# Patient Record
Sex: Male | Born: 1940 | Race: White | Hispanic: No | Marital: Married | Smoking: Former smoker
Health system: Southern US, Community
[De-identification: ages and names within clinical notes are randomized; demographics above are authoritative.]

## PROBLEM LIST (undated history)

## (undated) DIAGNOSIS — I272 Pulmonary hypertension, unspecified: Secondary | ICD-10-CM

## (undated) DIAGNOSIS — K219 Gastro-esophageal reflux disease without esophagitis: Secondary | ICD-10-CM

## (undated) DIAGNOSIS — Z973 Presence of spectacles and contact lenses: Secondary | ICD-10-CM

## (undated) DIAGNOSIS — N183 Chronic kidney disease, stage 3 unspecified: Secondary | ICD-10-CM

## (undated) DIAGNOSIS — E7800 Pure hypercholesterolemia, unspecified: Secondary | ICD-10-CM

## (undated) DIAGNOSIS — J302 Other seasonal allergic rhinitis: Secondary | ICD-10-CM

## (undated) DIAGNOSIS — M4802 Spinal stenosis, cervical region: Secondary | ICD-10-CM

## (undated) DIAGNOSIS — Q211 Atrial septal defect: Secondary | ICD-10-CM

## (undated) DIAGNOSIS — I2510 Atherosclerotic heart disease of native coronary artery without angina pectoris: Secondary | ICD-10-CM

## (undated) DIAGNOSIS — Z8679 Personal history of other diseases of the circulatory system: Secondary | ICD-10-CM

## (undated) DIAGNOSIS — I739 Peripheral vascular disease, unspecified: Secondary | ICD-10-CM

## (undated) DIAGNOSIS — I639 Cerebral infarction, unspecified: Secondary | ICD-10-CM

## (undated) DIAGNOSIS — I10 Essential (primary) hypertension: Secondary | ICD-10-CM

## (undated) DIAGNOSIS — I340 Nonrheumatic mitral (valve) insufficiency: Secondary | ICD-10-CM

## (undated) DIAGNOSIS — H409 Unspecified glaucoma: Secondary | ICD-10-CM

## (undated) DIAGNOSIS — I480 Paroxysmal atrial fibrillation: Secondary | ICD-10-CM

## (undated) DIAGNOSIS — Q2112 Patent foramen ovale: Secondary | ICD-10-CM

## (undated) DIAGNOSIS — E1121 Type 2 diabetes mellitus with diabetic nephropathy: Secondary | ICD-10-CM

## (undated) DIAGNOSIS — F419 Anxiety disorder, unspecified: Secondary | ICD-10-CM

## (undated) DIAGNOSIS — I5032 Chronic diastolic (congestive) heart failure: Secondary | ICD-10-CM

## (undated) DIAGNOSIS — Z9889 Other specified postprocedural states: Secondary | ICD-10-CM

## (undated) DIAGNOSIS — I779 Disorder of arteries and arterioles, unspecified: Secondary | ICD-10-CM

## (undated) DIAGNOSIS — G459 Transient cerebral ischemic attack, unspecified: Secondary | ICD-10-CM

## (undated) HISTORY — PX: CARDIAC CATHETERIZATION: SHX172

## (undated) HISTORY — DX: Transient cerebral ischemic attack, unspecified: G45.9

## (undated) HISTORY — DX: Gastro-esophageal reflux disease without esophagitis: K21.9

## (undated) HISTORY — DX: Spinal stenosis, cervical region: M48.02

## (undated) HISTORY — DX: Nonrheumatic mitral (valve) insufficiency: I34.0

## (undated) HISTORY — PX: KNEE SURGERY: SHX244

## (undated) HISTORY — PX: MOUTH SURGERY: SHX715

## (undated) HISTORY — DX: Unspecified glaucoma: H40.9

---
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
## 2011-12-02 ENCOUNTER — Emergency Department (HOSPITAL_BASED_OUTPATIENT_CLINIC_OR_DEPARTMENT_OTHER): Payer: Medicare Other

## 2011-12-02 ENCOUNTER — Emergency Department (HOSPITAL_BASED_OUTPATIENT_CLINIC_OR_DEPARTMENT_OTHER)
Admission: EM | Admit: 2011-12-02 | Discharge: 2011-12-02 | Disposition: A | Discharge status: Home / Self Care | Payer: Medicare Other | Attending: Emergency Medicine | Admitting: Emergency Medicine

## 2011-12-02 ENCOUNTER — Encounter (HOSPITAL_BASED_OUTPATIENT_CLINIC_OR_DEPARTMENT_OTHER): Payer: Self-pay | Admitting: *Deleted

## 2011-12-02 DIAGNOSIS — R05 Cough: Secondary | ICD-10-CM | POA: Insufficient documentation

## 2011-12-02 DIAGNOSIS — Z79899 Other long term (current) drug therapy: Secondary | ICD-10-CM | POA: Insufficient documentation

## 2011-12-02 DIAGNOSIS — E7800 Pure hypercholesterolemia, unspecified: Secondary | ICD-10-CM | POA: Insufficient documentation

## 2011-12-02 DIAGNOSIS — E119 Type 2 diabetes mellitus without complications: Secondary | ICD-10-CM | POA: Insufficient documentation

## 2011-12-02 DIAGNOSIS — R059 Cough, unspecified: Secondary | ICD-10-CM | POA: Insufficient documentation

## 2011-12-02 DIAGNOSIS — I10 Essential (primary) hypertension: Secondary | ICD-10-CM | POA: Insufficient documentation

## 2011-12-02 DIAGNOSIS — J3489 Other specified disorders of nose and nasal sinuses: Secondary | ICD-10-CM | POA: Insufficient documentation

## 2011-12-02 HISTORY — DX: Essential (primary) hypertension: I10

## 2011-12-02 HISTORY — DX: Pure hypercholesterolemia, unspecified: E78.00

## 2011-12-02 LAB — CBC WITH DIFFERENTIAL/PLATELET
Basophils Absolute: 0 10*3/uL (ref 0.0–0.1)
Basophils Relative: 1 % (ref 0–1)
Eosinophils Absolute: 0.1 10*3/uL (ref 0.0–0.7)
MCH: 33.4 pg (ref 26.0–34.0)
MCHC: 34.8 g/dL (ref 30.0–36.0)
Monocytes Absolute: 0.7 10*3/uL (ref 0.1–1.0)
Neutro Abs: 4.8 10*3/uL (ref 1.7–7.7)
Neutrophils Relative %: 74 % (ref 43–77)
RDW: 11.4 % — ABNORMAL LOW (ref 11.5–15.5)

## 2011-12-02 LAB — BASIC METABOLIC PANEL
BUN: 16 mg/dL (ref 6–23)
Calcium: 9.6 mg/dL (ref 8.4–10.5)
GFR calc Af Amer: 76 mL/min — ABNORMAL LOW (ref >90)
GFR calc non Af Amer: 66 mL/min — ABNORMAL LOW (ref >90)
Glucose, Bld: 140 mg/dL — ABNORMAL HIGH (ref 70–99)
Potassium: 4.1 mEq/L (ref 3.5–5.1)
Sodium: 136 mEq/L (ref 135–145)

## 2011-12-02 MED ORDER — ALPRAZOLAM 0.5 MG PO TABS
1.0000 mg | ORAL_TABLET | Freq: Once | ORAL | Status: AC
  Administered 2011-12-02: 1 mg via ORAL
  Filled 2011-12-02: qty 2

## 2011-12-02 NOTE — ED Provider Notes (Addendum)
History     CSN: [id number]  Arrival date & time 12/02/11  1444   First MD Initiated Contact with Patient 12/02/11 1640      Chief Complaint  Patient presents with  . Panic Attack    (Consider location/radiation/quality/duration/timing/severity/associated sxs/prior treatment) HPI  Patient states seen at PMD is Dr. At [facility].  Patien tstates he had cold symptoms for a week.  He states whenever he goes into a medical situation he has been taking otc cold medicine.  He denies chest pain or dyspnea.  Patient states bp was 220 and sent here.  He states this has happened multiple times.  He feels improved.  His wife had similar symptoms before he has.  His blood sugar has never been over 120.    Past Medical History  Diagnosis Date  . Diabetes mellitus without complication   . Hypertension   . High cholesterol     History reviewed. No pertinent past surgical history.  No family history on file.  History  Substance Use Topics  . Smoking status: Never Smoker   . Smokeless tobacco: Not on file  . Alcohol Use: Yes      Review of Systems  Constitutional: Negative for chills and activity change.  HENT: Positive for congestion, rhinorrhea and sneezing.   Respiratory: Positive for cough. Negative for chest tightness, shortness of breath and wheezing.   Cardiovascular: Negative for chest pain and leg swelling.  Gastrointestinal: Negative for abdominal distention.  Genitourinary: Negative for difficulty urinating.  Neurological: Negative for dizziness, weakness, light-headedness, numbness and headaches.    Allergies  Penicillins  Home Medications   Current Outpatient Rx  Name Route Sig Dispense Refill  . CLINDINIUM-CHLORDIAZEPOXIDE 2.5-5 MG PO CAPS Oral Take 1 capsule by mouth 3 (three) times daily as needed.    . AVAPRO PO Oral Take by mouth.    . METFORMIN HCL PO Oral Take by mouth.    . METOPROLOL SUCCINATE ER 200 MG PO TB24 Oral Take 200 mg by mouth  daily.    Marland Kitchen PRAVACHOL PO Oral Take by mouth.      BP 163/73  Pulse 68  Temp 98.7 F (37.1 C) (Oral)  Resp 16  SpO2 100%  Physical Exam  Nursing note and vitals reviewed. Constitutional: He is oriented to person, place, and time. He appears well-developed and well-nourished.  HENT:  Head: Normocephalic and atraumatic.  Eyes: Conjunctivae normal and EOM are normal. Pupils are equal, round, and reactive to light.  Neck: Normal range of motion. Neck supple.  Cardiovascular: Normal rate and normal heart sounds.   Pulmonary/Chest: Effort normal and breath sounds normal.  Abdominal: Soft. Bowel sounds are normal.  Musculoskeletal: Normal range of motion.  Neurological: He is alert and oriented to person, place, and time.  Skin: Skin is warm and dry.  Psychiatric: He has a normal mood and affect.    ED Course  Procedures (including critical care time)  Labs Reviewed - No data to display No results found.   No diagnosis found.  Date: 12/02/2011  Rate: 72  Rhythm: normal sinus rhythm  QRS Axis: normal  Intervals: first degree av block  ST/T Wave abnormalities: normal  Conduction Disutrbances:first-degree A-V block   Narrative Interpretation:   Old EKG Reviewed: none available   Results for orders placed during the hospital encounter of 12/02/11  CBC WITH DIFFERENTIAL      Component Value Range   WBC 6.4  4.0 - 10.5 K/uL   RBC  3.50 (*) 4.22 - 5.81 MIL/uL   Hemoglobin 11.7 (*) 13.0 - 17.0 g/dL   HCT 40.9 (*) 81.1 - 91.4 %   MCV 96.0  78.0 - 100.0 fL   MCH 33.4  26.0 - 34.0 pg   MCHC 34.8  30.0 - 36.0 g/dL   RDW 78.2 (*) 95.6 - 21.3 %   Platelets 177  150 - 400 K/uL   Neutrophils Relative 74  43 - 77 %   Neutro Abs 4.8  1.7 - 7.7 K/uL   Lymphocytes Relative 13  12 - 46 %   Lymphs Abs 0.9  0.7 - 4.0 K/uL   Monocytes Relative 10  3 - 12 %   Monocytes Absolute 0.7  0.1 - 1.0 K/uL   Eosinophils Relative 2  0 - 5 %   Eosinophils Absolute 0.1  0.0 - 0.7 K/uL   Basophils  Relative 1  0 - 1 %   Basophils Absolute 0.0  0.0 - 0.1 K/uL  TROPONIN I      Component Value Range   Troponin I <0.30  <0.30 ng/mL  BASIC METABOLIC PANEL      Component Value Range   Sodium 136  135 - 145 mEq/L   Potassium 4.1  3.5 - 5.1 mEq/L   Chloride 94 (*) 96 - 112 mEq/L   CO2 29  19 - 32 mEq/L   Glucose, Bld 140 (*) 70 - 99 mg/dL   BUN 16  6 - 23 mg/dL   Creatinine, Ser 0.86  0.50 - 1.35 mg/dL   Calcium 9.6  8.4 - 57.8 mg/dL   GFR calc non Af Amer 66 (*) >90 mL/min   GFR calc Af Amer 76 (*) >90 mL/min     MDM  Patient with asymptomatic hypertension today presented from [facility] via [facility]. Patient reports this has happened multiple times when he is evaluated by medical personnel. He self describes this as white coat syndrome. His blood pressure has decreased on arrival from reported systolic blood pressure of 220-183/79 here. He has no headache and no focal neurological deficits. He is already on anti-hypertensives. He is followed by an internist. He is advised to followup there.  The results of his labs are above and results were discussed with the patient.        Hilario Quarry, MD 12/02/11 4696  Hilario Quarry, MD 01/04/12 [phone]

## 2011-12-02 NOTE — ED Notes (Signed)
Went to the [facility] for cold symptoms and had a panic attack while there. States this is common whenever he is around [employer]. EMS was called and transported pt here due to elevated BP. He is alert oriented on arrival. Worried about how he will get back to his car in [place].

## 2013-02-03 DIAGNOSIS — M4802 Spinal stenosis, cervical region: Secondary | ICD-10-CM

## 2013-02-03 HISTORY — DX: Spinal stenosis, cervical region: M48.02

## 2014-02-25 ENCOUNTER — Emergency Department (HOSPITAL_COMMUNITY): Payer: Medicare Other

## 2014-02-25 ENCOUNTER — Encounter (HOSPITAL_COMMUNITY): Payer: Self-pay | Admitting: Radiology

## 2014-02-25 ENCOUNTER — Emergency Department (HOSPITAL_COMMUNITY)
Admission: EM | Admit: 2014-02-25 | Discharge: 2014-02-25 | Disposition: A | Discharge status: Home / Self Care | Payer: Medicare Other | Attending: Emergency Medicine | Admitting: Emergency Medicine

## 2014-02-25 DIAGNOSIS — Z79899 Other long term (current) drug therapy: Secondary | ICD-10-CM | POA: Diagnosis not present

## 2014-02-25 DIAGNOSIS — E780 Pure hypercholesterolemia: Secondary | ICD-10-CM | POA: Diagnosis not present

## 2014-02-25 DIAGNOSIS — I10 Essential (primary) hypertension: Secondary | ICD-10-CM | POA: Diagnosis not present

## 2014-02-25 DIAGNOSIS — R4781 Slurred speech: Secondary | ICD-10-CM | POA: Diagnosis present

## 2014-02-25 DIAGNOSIS — Z880 Allergy status to penicillin: Secondary | ICD-10-CM | POA: Insufficient documentation

## 2014-02-25 DIAGNOSIS — E119 Type 2 diabetes mellitus without complications: Secondary | ICD-10-CM | POA: Diagnosis not present

## 2014-02-25 DIAGNOSIS — Z7982 Long term (current) use of aspirin: Secondary | ICD-10-CM | POA: Insufficient documentation

## 2014-02-25 DIAGNOSIS — G459 Transient cerebral ischemic attack, unspecified: Secondary | ICD-10-CM

## 2014-02-25 LAB — I-STAT TROPONIN, ED: TROPONIN I, POC: 0 ng/mL (ref 0.00–0.08)

## 2014-02-25 LAB — COMPREHENSIVE METABOLIC PANEL
ALK PHOS: 74 U/L (ref 39–117)
ALT: 12 U/L (ref 0–53)
ANION GAP: 10 (ref 5–15)
AST: 20 U/L (ref 0–37)
Albumin: 3.9 g/dL (ref 3.5–5.2)
BILIRUBIN TOTAL: 0.8 mg/dL (ref 0.3–1.2)
BUN: 15 mg/dL (ref 6–23)
CALCIUM: 9.2 mg/dL (ref 8.4–10.5)
CO2: 26 mmol/L (ref 19–32)
CREATININE: 1.33 mg/dL (ref 0.50–1.35)
Chloride: 98 mmol/L (ref 96–112)
GFR calc Af Amer: 60 mL/min — ABNORMAL LOW (ref >90)
GFR, EST NON AFRICAN AMERICAN: 51 mL/min — AB (ref >90)
GLUCOSE: 135 mg/dL — AB (ref 70–99)
Potassium: 4.8 mmol/L (ref 3.5–5.1)
Sodium: 134 mmol/L — ABNORMAL LOW (ref 135–145)
Total Protein: 6.5 g/dL (ref 6.0–8.3)

## 2014-02-25 LAB — DIFFERENTIAL
BASOS ABS: 0 10*3/uL (ref 0.0–0.1)
Basophils Relative: 1 % (ref 0–1)
Eosinophils Absolute: 0.1 10*3/uL (ref 0.0–0.7)
Eosinophils Relative: 2 % (ref 0–5)
LYMPHS ABS: 0.9 10*3/uL (ref 0.7–4.0)
LYMPHS PCT: 15 % (ref 12–46)
MONO ABS: 0.8 10*3/uL (ref 0.1–1.0)
MONOS PCT: 12 % (ref 3–12)
NEUTROS ABS: 4.6 10*3/uL (ref 1.7–7.7)
NEUTROS PCT: 70 % (ref 43–77)

## 2014-02-25 LAB — I-STAT CHEM 8, ED
BUN: 18 mg/dL (ref 6–23)
CHLORIDE: 100 mmol/L (ref 96–112)
CREATININE: 1.3 mg/dL (ref 0.50–1.35)
Calcium, Ion: 1.16 mmol/L (ref 1.13–1.30)
GLUCOSE: 135 mg/dL — AB (ref 70–99)
HCT: 40 % (ref 39.0–52.0)
Hemoglobin: 13.6 g/dL (ref 13.0–17.0)
Potassium: 4.9 mmol/L (ref 3.5–5.1)
Sodium: 136 mmol/L (ref 135–145)
TCO2: 24 mmol/L (ref 0–100)

## 2014-02-25 LAB — CBC
HEMATOCRIT: 36.3 % — AB (ref 39.0–52.0)
Hemoglobin: 12.6 g/dL — ABNORMAL LOW (ref 13.0–17.0)
MCH: 33.6 pg (ref 26.0–34.0)
MCHC: 34.7 g/dL (ref 30.0–36.0)
MCV: 96.8 fL (ref 78.0–100.0)
PLATELETS: 179 10*3/uL (ref 150–400)
RBC: 3.75 MIL/uL — ABNORMAL LOW (ref 4.22–5.81)
RDW: 12.3 % (ref 11.5–15.5)
WBC: 6.4 10*3/uL (ref 4.0–10.5)

## 2014-02-25 LAB — PROTIME-INR
INR: 1.06 (ref 0.00–1.49)
Prothrombin Time: 13.9 seconds (ref 11.6–15.2)

## 2014-02-25 LAB — APTT: APTT: 26 s (ref 24–37)

## 2014-02-25 NOTE — Discharge Instructions (Signed)
Transient Ischemic Attack Increase your aspirin to 325 mg a day as discussed with Dr. Doy Mince.  A transient ischemic attack (TIA) is a "warning stroke" that causes stroke-like symptoms. Unlike a stroke, a TIA does not cause permanent damage to the brain. The symptoms of a TIA can happen very fast and do not last long. It is important to know the symptoms of a TIA and what to do. This can help prevent a major stroke or death. CAUSES   A TIA is caused by a temporary blockage in an artery in the brain or neck (carotid artery). The blockage does not allow the brain to get the blood supply it needs and can cause different symptoms. The blockage can be caused by either:  A blood clot.  Fatty buildup (plaque) in a neck or brain artery. RISK FACTORS  High blood pressure (hypertension).  High cholesterol.  Diabetes mellitus.  Heart disease.  The build up of plaque in the blood vessels (peripheral artery disease or atherosclerosis).  The build up of plaque in the blood vessels providing blood and oxygen to the brain (carotid artery stenosis).  An abnormal heart rhythm (atrial fibrillation).  Obesity.  Smoking.  Taking oral contraceptives (especially in combination with smoking).  Physical inactivity.  A diet high in fats, salt (sodium), and calories.  Alcohol use.  Use of illegal drugs (especially cocaine and methamphetamine).  Being male.  Being African American.  Being over the age of 57.  Family history of stroke.  Previous history of blood clots, stroke, TIA, or heart attack.  Sickle cell disease. SYMPTOMS  TIA symptoms are the same as a stroke but are temporary. These symptoms usually develop suddenly, or may be newly present upon awakening from sleep:  Sudden weakness or numbness of the face, arm, or leg, especially on one side of the body.  Sudden trouble walking or difficulty moving arms or legs.  Sudden confusion.  Sudden personality changes.  Trouble  speaking (aphasia) or understanding.  Difficulty swallowing.  Sudden trouble seeing in one or both eyes.  Double vision.  Dizziness.  Loss of balance or coordination.  Sudden severe headache with no known cause.  Trouble reading or writing.  Loss of bowel or bladder control.  Loss of consciousness. DIAGNOSIS  Your caregiver may be able to determine the presence or absence of a TIA based on your symptoms, history, and physical exam. Computed tomography (CT scan) of the brain is usually performed to help identify a TIA. Other tests may be done to diagnose a TIA. These tests may include:  Electrocardiography.  Continuous heart monitoring.  Echocardiography.  Carotid ultrasonography.  Magnetic resonance imaging (MRI).  A scan of the brain circulation.  Blood tests. PREVENTION  The risk of a TIA can be decreased by appropriately treating high blood pressure, high cholesterol, diabetes, heart disease, and obesity and by quitting smoking, limiting alcohol, and staying physically active. TREATMENT  Time is of the essence. Since the symptoms of TIA are the same as a stroke, it is important to seek treatment as soon as possible because you may need a medicine to dissolve the clot (thrombolytic) that cannot be given if too much time has passed. Treatment options vary. Treatment options may include rest, oxygen, intravenous (IV) fluids, and medicines to thin the blood (anticoagulants). Medicines and diet may be used to address diabetes, high blood pressure, and other risk factors. Measures will be taken to prevent short-term and long-term complications, including infection from breathing foreign material into the  lungs (aspiration pneumonia), blood clots in the legs, and falls. Treatment options include procedures to either remove plaque in the carotid arteries or dilate carotid arteries that have narrowed due to plaque. Those procedures are:  Carotid endarterectomy.  Carotid  angioplasty and stenting. HOME CARE INSTRUCTIONS   Take all medicines prescribed by your caregiver. Follow the directions carefully. Medicines may be used to control risk factors for a stroke. Be sure you understand all your medicine instructions.  You may be told to take aspirin or the anticoagulant warfarin. Warfarin needs to be taken exactly as instructed.  Taking too much or too little warfarin is dangerous. Too much warfarin increases the risk of bleeding. Too little warfarin continues to allow the risk for blood clots. While taking warfarin, you will need to have regular blood tests to measure your blood clotting time. A PT blood test measures how long it takes for blood to clot. Your PT is used to calculate another value called an INR. Your PT and INR help your caregiver to adjust your dose of warfarin. The dose can change for many reasons. It is critically important that you take warfarin exactly as prescribed.  Many foods, especially foods high in vitamin K can interfere with warfarin and affect the PT and INR. Foods high in vitamin K include spinach, kale, broccoli, cabbage, collard and turnip greens, brussels sprouts, peas, cauliflower, seaweed, and parsley as well as beef and pork liver, green tea, and soybean oil. You should eat a consistent amount of foods high in vitamin K. Avoid major changes in your diet, or notify your caregiver before changing your diet. Arrange a visit with a dietitian to answer your questions.  Many medicines can interfere with warfarin and affect the PT and INR. You must tell your caregiver about any and all medicines you take, this includes all vitamins and supplements. Be especially cautious with aspirin and anti-inflammatory medicines. Do not take or discontinue any prescribed or over-the-counter medicine except on the advice of your caregiver or pharmacist.  Warfarin can have side effects, such as excessive bruising or bleeding. You will need to hold pressure  over cuts for longer than usual. Your caregiver or pharmacist will discuss other potential side effects.  Avoid sports or activities that may cause injury or bleeding.  Be mindful when shaving, flossing your teeth, or handling sharp objects.  Alcohol can change the body's ability to handle warfarin. It is best to avoid alcoholic drinks or consume only very small amounts while taking warfarin. Notify your caregiver if you change your alcohol intake.  Notify your dentist or other caregivers before procedures.  Eat a diet that includes 5 or more servings of fruits and vegetables each day. This may reduce the risk of stroke. Certain diets may be prescribed to address high blood pressure, high cholesterol, diabetes, or obesity.  A low-sodium, low-saturated fat, low-trans fat, low-cholesterol diet is recommended to manage high blood pressure.  A low-saturated fat, low-trans fat, low-cholesterol, and high-fiber diet may control cholesterol levels.  A controlled-carbohydrate, controlled-sugar diet is recommended to manage diabetes.  A reduced-calorie, low-sodium, low-saturated fat, low-trans fat, low-cholesterol diet is recommended to manage obesity.  Maintain a healthy weight.  Stay physically active. It is recommended that you get at least 30 minutes of activity on most or all days.  Do not smoke.  Limit alcohol use even if you are not taking warfarin. Moderate alcohol use is considered to be:  No more than 2 drinks each day for men.  No more than 1 drink each day for nonpregnant women.  Stop drug abuse.  Home safety. A safe home environment is important to reduce the risk of falls. Your caregiver may arrange for specialists to evaluate your home. Having grab bars in the bedroom and bathroom is often important. Your caregiver may arrange for equipment to be used at home, such as raised toilets and a seat for the shower.  Follow all instructions for follow-up with your caregiver. This is  very important. This includes any referrals and lab tests. Proper follow up can prevent a stroke or another TIA from occurring. SEEK MEDICAL CARE IF:  You have personality changes.  You have difficulty swallowing.  You are seeing double.  You have dizziness.  You have a fever.  You have skin breakdown. SEEK IMMEDIATE MEDICAL CARE IF:  Any of these symptoms may represent a serious problem that is an emergency. Do not wait to see if the symptoms will go away. Get medical help right away. Call your local emergency services (911 in [place] Do not drive yourself to the hospital.  You have sudden weakness or numbness of the face, arm, or leg, especially on one side of the body.  You have sudden trouble walking or difficulty moving arms or legs.  You have sudden confusion.  You have trouble speaking (aphasia) or understanding.  You have sudden trouble seeing in one or both eyes.  You have a loss of balance or coordination.  You have a sudden, severe headache with no known cause.  You have new chest pain or an irregular heartbeat.  You have a partial or total loss of consciousness. MAKE SURE YOU:   Understand these instructions.  Will watch your condition.  Will get help right away if you are not doing well or get worse. Document Released: 10/30/2004 Document Revised: 01/25/2013 Document Reviewed: 04/27/2013 [facility] Patient Information 2015 [place], [place]. This information is not intended to replace advice given to you by your health care provider. Make sure you discuss any questions you have with your health care provider.

## 2014-02-25 NOTE — ED Notes (Addendum)
Pt reports to the ED for eval of sudden onset slurred speech and aphasia. Reports the symptoms lasted approx 5 minutes. He has recent hx of TIAs and was admitted to [facility] around Christmas and had a negative work up. He reports these symptoms lasted approx 5 minutes which is longer than they normally last. He also reports he has been having bilateral leg weakness and pain with minimal exercise. Symptoms resolved at the time of arrival. NIH- 0. Pt also had some recent falls with head injury. PERRL intact. Denies any HA or CP. Pt A&Ox4, resp e/u, and skin warm and dry.

## 2014-02-25 NOTE — Code Documentation (Signed)
Code stroke called at 1152, patient arrived to [facility] ED via [facility] at 1228.  AS per patient, LSN 0930 developed bilateral leg weakness with slurred speech and what sounds like some aphasia.  Patient states has been having TIA's and has a workup at an outside facility and this presentation this am is similar to past TIA's.  On arrival to ED patients symptoms resolved and patients states he is back to baseline.  NIHSS 0

## 2014-02-25 NOTE — ED Provider Notes (Signed)
CSN: [id number]     Arrival date & time 02/25/14  1228 History   First MD Initiated Contact with Patient 02/25/14 1236     Chief Complaint  Patient presents with  . Code Stroke    @EDPCLEARED @ (Consider location/radiation/quality/duration/timing/severity/associated sxs/prior Treatment) HPI The patient had an onset of slurred speech and difficulty talking which lasted for approximately 5 minutes. This came on suddenly between 9:30 and 10:00. The patient's wife had been outside shoveling snow so she wasn't there at the exact onset of symptoms. She did come in and reports that his speech was very difficult to understand and his words didn't make sense. She gave him 3 aspirins and they called the EMS. The patient has had several episodes of TIAs diagnosed. These haven't coming and going for about 6-9 months. Prior evaluation was done at [facility] around Christmas time. The patient reports that all the results were negative and ultimately he was diagnosed as a TIA. There is no pain associated with these episodes. At this point time all symptoms have resolved. The patient has stopped doing much exercise or activity because he feels that the symptoms are precipitated by activity. He describes weakness in both of his legs with limited amount of exertion. Past Medical History  Diagnosis Date  . Diabetes mellitus without complication   . Hypertension   . High cholesterol    History reviewed. No pertinent past surgical history. History reviewed. No pertinent family history. History  Substance Use Topics  . Smoking status: Never Smoker   . Smokeless tobacco: Not on file  . Alcohol Use: Yes     Comment: a few beers a day    Review of Systems 10 Systems reviewed and are negative for acute change except as noted in the HPI.    Allergies  Penicillins; Shellfish allergy; and Fenofibrate  Home Medications   Prior to Admission medications   Medication Sig Start Date End Date Taking? Authorizing  Provider  ALPRAZolam (XANAX) 0.25 MG tablet Take 0.25 mg by mouth 2 (two) times daily as needed for anxiety.   Yes Historical Provider, MD  aspirin 81 MG tablet Take 81 mg by mouth daily.   Yes Historical Provider, MD  Coenzyme Q10 (CO Q 10) 100 MG CAPS Take 1 capsule by mouth daily.   Yes Historical Provider, MD  dicyclomine (BENTYL) 20 MG tablet Take 20 mg by mouth daily.   Yes Historical Provider, MD  irbesartan (AVAPRO) 300 MG tablet Take 300 mg by mouth daily. 06/11/11  Yes Historical Provider, MD  loratadine (CLARITIN) 10 MG tablet Take 10 mg by mouth daily.   Yes Historical Provider, MD  metFORMIN (GLUCOPHAGE) 500 MG tablet Take 500 mg by mouth 2 (two) times daily with a meal.   Yes Historical Provider, MD  metoprolol (TOPROL-XL) 200 MG 24 hr tablet Take 200 mg by mouth daily.   Yes Historical Provider, MD  omeprazole (PRILOSEC) 20 MG capsule Take 20 mg by mouth daily.   Yes Historical Provider, MD  pravastatin (PRAVACHOL) 80 MG tablet Take 80 mg by mouth daily.   Yes Historical Provider, MD  simethicone (MYLICON) 80 MG chewable tablet Chew 80 mg by mouth 2 (two) times daily.   Yes Historical Provider, MD   BP 183/75 mmHg  Pulse 78  Temp(Src) 99 F (37.2 C) (Oral)  Resp 22  Ht 5\' 6"  (1.676 m)  Wt 179 lb (81.194 kg)  BMI 28.91 kg/m2  SpO2 94% Physical Exam  Constitutional: He is oriented to person,  place, and time. He appears well-developed and well-nourished.  HENT:  Head: Normocephalic and atraumatic.  Eyes: EOM are normal. Pupils are equal, round, and reactive to light.  Neck: Neck supple.  Cardiovascular: Normal rate, regular rhythm, normal heart sounds and intact distal pulses.   Pulmonary/Chest: Effort normal and breath sounds normal.  Abdominal: Soft. Bowel sounds are normal. He exhibits no distension. There is no tenderness.  Musculoskeletal: Normal range of motion. He exhibits no edema.  Neurological: He is alert and oriented to person, place, and time. He has normal  strength. Coordination normal. GCS eye subscore is 4. GCS verbal subscore is 5. GCS motor subscore is 6.  Bilateral biceps and bilateral patellar reflexes 2+ and symmetric. Upper and lower extremity strength is 5/5 and symmetric. Mental status is clear and speech is clear with normal content. Cranial nerves II through XII are intact.  Skin: Skin is warm, dry and intact.  Psychiatric: He has a normal mood and affect.    ED Course  Procedures (including critical care time) Labs Review Labs Reviewed  CBC - Abnormal; Notable for the following:    RBC 3.75 (*)    Hemoglobin 12.6 (*)    HCT 36.3 (*)    All other components within normal limits  COMPREHENSIVE METABOLIC PANEL - Abnormal; Notable for the following:    Sodium 134 (*)    Glucose, Bld 135 (*)    GFR calc non Af Amer 51 (*)    GFR calc Af Amer 60 (*)    All other components within normal limits  I-STAT CHEM 8, ED - Abnormal; Notable for the following:    Glucose, Bld 135 (*)    All other components within normal limits  PROTIME-INR  APTT  DIFFERENTIAL  I-STAT TROPOININ, ED  I-STAT TROPOININ, ED    Imaging Review Ct Head Wo Contrast  02/25/2014   CLINICAL DATA:  Slurred speech and aphasia. History of multiple TIAs.  EXAM: CT HEAD WITHOUT CONTRAST  TECHNIQUE: Contiguous axial images were obtained from the base of the skull through the vertex without intravenous contrast.  COMPARISON:  None.  FINDINGS: The brain demonstrates cortical atrophy is well as mild small vessel ischemic changes in the periventricular white matter. The brain demonstrates no evidence of hemorrhage, infarction, edema, mass effect, extra-axial fluid collection, hydrocephalus or mass lesion. The skull is unremarkable.  IMPRESSION: No acute findings.  Cortical atrophy and mild small vessel disease.   Electronically Signed   By: Aletta Edouard M.D.   On: 02/25/2014 12:56   Mr Jodene Nam Head Wo Contrast  02/25/2014   CLINICAL DATA:  Hypertension, diabetes,  hyperlipidemia. TIA. Bilateral lower extremity weakness, right facial droop, inability to speak, word-finding difficulty, slurred speech  EXAM: MRI HEAD WITHOUT CONTRAST  MRA HEAD WITHOUT CONTRAST  TECHNIQUE: Multiplanar, multiecho pulse sequences of the brain and surrounding structures were obtained without intravenous contrast. Angiographic images of the head were obtained using MRA technique without contrast.  COMPARISON:  CT 02/25/2014  FINDINGS: MRI HEAD FINDINGS  Negative for acute infarct. Mild chronic microvascular ischemic change in the white matter  Generalized atrophy.  Negative for hydrocephalus.  Negative for hemorrhage or mass. Pituitary normal in size. No shift of the midline structures.  Paranasal sinuses are clear.  MRA HEAD FINDINGS  Left vertebral artery dominant and widely patent to the basilar. Right vertebral artery ends in PICA. Left PICA not visualized. Basilar widely patent. Superior cerebellar and posterior cerebral arteries widely patent. Fetal origin left posterior cerebral artery  with hypoplastic left P1 segment.  Cavernous carotid widely patent bilaterally without stenosis. Anterior and middle cerebral arteries widely patent.  Negative for cerebral aneurysm.  IMPRESSION: Negative for acute infarct. Mild chronic microvascular ischemic change  Negative MRA head.   Electronically Signed   By: Franchot Gallo M.D.   On: 02/25/2014 15:13   Mr Brain Wo Contrast  02/25/2014   CLINICAL DATA:  Hypertension, diabetes, hyperlipidemia. TIA. Bilateral lower extremity weakness, right facial droop, inability to speak, word-finding difficulty, slurred speech  EXAM: MRI HEAD WITHOUT CONTRAST  MRA HEAD WITHOUT CONTRAST  TECHNIQUE: Multiplanar, multiecho pulse sequences of the brain and surrounding structures were obtained without intravenous contrast. Angiographic images of the head were obtained using MRA technique without contrast.  COMPARISON:  CT 02/25/2014  FINDINGS: MRI HEAD FINDINGS  Negative  for acute infarct. Mild chronic microvascular ischemic change in the white matter  Generalized atrophy.  Negative for hydrocephalus.  Negative for hemorrhage or mass. Pituitary normal in size. No shift of the midline structures.  Paranasal sinuses are clear.  MRA HEAD FINDINGS  Left vertebral artery dominant and widely patent to the basilar. Right vertebral artery ends in PICA. Left PICA not visualized. Basilar widely patent. Superior cerebellar and posterior cerebral arteries widely patent. Fetal origin left posterior cerebral artery with hypoplastic left P1 segment.  Cavernous carotid widely patent bilaterally without stenosis. Anterior and middle cerebral arteries widely patent.  Negative for cerebral aneurysm.  IMPRESSION: Negative for acute infarct. Mild chronic microvascular ischemic change  Negative MRA head.   Electronically Signed   By: Franchot Gallo M.D.   On: 02/25/2014 15:13     EKG Interpretation   Date/Time:  Saturday February 25 2014 12:54:07 EST Ventricular Rate:  70 PR Interval:  212 QRS Duration: 85 QT Interval:  400 QTC Calculation: 432 R Axis:   46 Text Interpretation:  Sinus rhythm Atrial premature complex Borderline  prolonged PR interval agree. no change. Confirmed by Johnney Killian, MD, Jeannie Done  [phone]) on 02/25/2014 1:04:38 PM     Consult: Dr. Doy Mince has evaluated the patient and planned for MRI. MDM   Final diagnoses:  Transient cerebral ischemia, unspecified transient cerebral ischemia type   The patient has been evaluated by neurology in the emergency department. MRI does not reveal any acute abnormalities. After discussion with Dr. Doy Mince, the patient will be discharged with an increase in his daily aspirin dose to 325 mg and continued outpatient neurology follow-up. He arrived in good condition, asymptomatic. He does not appear to have any acute metabolic or infectious processes involved. At this time he is discharged in stable condition.    Charlesetta Shanks,  MD 02/26/14 [phone]

## 2014-02-25 NOTE — ED Notes (Signed)
Dr. Reynolds at bedside.
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
## 2014-04-26 ENCOUNTER — Ambulatory Visit: Payer: Medicare Other | Admitting: Neurology

## 2015-03-10 ENCOUNTER — Emergency Department
Admission: EM | Admit: 2015-03-10 | Discharge: 2015-03-10 | Disposition: A | Discharge status: Home / Self Care | Payer: Medicare Other | Source: Home / Self Care | Attending: Family Medicine | Admitting: Family Medicine

## 2015-03-10 ENCOUNTER — Encounter: Payer: Self-pay | Admitting: Emergency Medicine

## 2015-03-10 DIAGNOSIS — B9789 Other viral agents as the cause of diseases classified elsewhere: Principal | ICD-10-CM

## 2015-03-10 DIAGNOSIS — J069 Acute upper respiratory infection, unspecified: Secondary | ICD-10-CM | POA: Diagnosis not present

## 2015-03-10 HISTORY — DX: Other seasonal allergic rhinitis: J30.2

## 2015-03-10 HISTORY — DX: Anxiety disorder, unspecified: F41.9

## 2015-03-10 MED ORDER — DOXYCYCLINE HYCLATE 100 MG PO CAPS: 100.0000 mg | ORAL_CAPSULE | Freq: Two times a day (BID) | ORAL | Status: DC

## 2015-03-10 NOTE — Discharge Instructions (Signed)
Take plain guaifenesin (such as Robitussin syrup) with plenty of water, for cough and congestion.  Get adequate rest.   May use Afrin nasal spray (or generic oxymetazoline) twice daily for about 5 days and then discontinue.  Also recommend using saline nasal spray several times daily and saline nasal irrigation (AYR is a common brand).  Use Flonase nasal spray each morning after using Afrin nasal spray and saline nasal irrigation. Try warm salt water gargles for sore throat.  Stop all antihistamines for now, and other non-prescription cough/cold preparations. May take Delsym Cough Suppressant at bedtime for nighttime cough.  Follow-up with family doctor if not improving about10 days.

## 2015-03-10 NOTE — ED Provider Notes (Signed)
CSN: [id number]     Arrival date & time 03/10/15  1332 History   First MD Initiated Contact with Patient 03/10/15 1556     Chief Complaint  Patient presents with  . Nasal Congestion  . Cough  . Hoarse      HPI Comments: Patient complains of five day history of typical cold-like symptoms including mild sore throat, sinus congestion, fatigue, and cough.   The history is provided by the patient.    Past Medical History  Diagnosis Date  . Diabetes mellitus without complication ([place])   . Hypertension   . High cholesterol   . Anxiety   . Seasonal allergies    History reviewed. No pertinent past surgical history. History reviewed. No pertinent family history. Social History  Substance Use Topics  . Smoking status: Never Smoker   . Smokeless tobacco: None  . Alcohol Use: Yes     Comment: a few beers a day    Review of Systems + sore throat + hoarse + cough + sneezing No pleuritic pain No wheezing + nasal congestion + post-nasal drainage No sinus pain/pressure No itchy/red eyes No earache No hemoptysis No SOB No fever/chills No nausea No vomiting No abdominal pain No diarrhea No urinary symptoms No skin rash + fatigue No myalgias No headache Used OTC meds without relief  Allergies  Penicillins; Shellfish allergy; and Fenofibrate  Home Medications   Prior to Admission medications   Medication Sig Start Date End Date Taking? Authorizing Provider  atorvastatin (LIPITOR) 40 MG tablet Take 40 mg by mouth daily.   Yes Historical Provider, MD  ALPRAZolam (XANAX) 0.25 MG tablet Take 0.25 mg by mouth 2 (two) times daily as needed for anxiety.    Historical Provider, MD  aspirin 81 MG tablet Take 81 mg by mouth daily.    Historical Provider, MD  Coenzyme Q10 (CO Q 10) 100 MG CAPS Take 1 capsule by mouth daily.    Historical Provider, MD  dicyclomine (BENTYL) 20 MG tablet Take 20 mg by mouth daily.    Historical Provider, MD  doxycycline (VIBRAMYCIN) 100 MG capsule  Take 1 capsule (100 mg total) by mouth 2 (two) times daily. Take with food. 03/10/15   Kandra Nicolas, MD  irbesartan (AVAPRO) 300 MG tablet Take 300 mg by mouth daily. 06/11/11   Historical Provider, MD  loratadine (CLARITIN) 10 MG tablet Take 10 mg by mouth daily.    Historical Provider, MD  metFORMIN (GLUCOPHAGE) 500 MG tablet Take 500 mg by mouth 2 (two) times daily with a meal.    Historical Provider, MD  metoprolol (TOPROL-XL) 200 MG 24 hr tablet Take 200 mg by mouth daily.    Historical Provider, MD  omeprazole (PRILOSEC) 20 MG capsule Take 20 mg by mouth daily.    Historical Provider, MD  pravastatin (PRAVACHOL) 80 MG tablet Take 80 mg by mouth daily.    Historical Provider, MD  simethicone (MYLICON) 80 MG chewable tablet Chew 80 mg by mouth 2 (two) times daily.    Historical Provider, MD   Meds Ordered and Administered this Visit  Medications - No data to display  BP 200/91 mmHg  Pulse 60  Temp(Src) 98.7 F (37.1 C) (Oral)  Resp 16  Ht 5\' 5"  (1.651 m)  Wt 180 lb (81.647 kg)  BMI 29.95 kg/m2  SpO2 99% No data found.   Physical Exam Nursing notes and Vital Signs reviewed. Appearance:  Patient appears stated age, and in no acute distress Eyes:  Pupils  are equal, round, and reactive to light and accomodation.  Extraocular movement is intact.  Conjunctivae are not inflamed  Ears:  Canals normal.  Tympanic membranes normal.  Nose:  Mildly congested turbinates.  No sinus tenderness.   Pharynx:  Normal Neck:  Supple.  Tender enlarged posterior nodes are palpated bilaterally  Lungs:  Clear to auscultation.  Breath sounds are equal.  Moving air well. Heart:  Regular rate and rhythm without murmurs, rubs, or gallops.  Abdomen:  Nontender without masses or hepatosplenomegaly.  Bowel sounds are present.  No CVA or flank tenderness.  Extremities:  No edema.  Skin:  No rash present.   ED Course  Procedures  None    MDM   1. Viral URI with cough    Begin doxycycline for atypical  coverage. Take plain guaifenesin (such as Robitussin syrup) with plenty of water, for cough and congestion.  Get adequate rest.   May use Afrin nasal spray (or generic oxymetazoline) twice daily for about 5 days and then discontinue.  Also recommend using saline nasal spray several times daily and saline nasal irrigation (AYR is a common brand).  Use Flonase nasal spray each morning after using Afrin nasal spray and saline nasal irrigation. Try warm salt water gargles for sore throat.  Stop all antihistamines for now, and other non-prescription cough/cold preparations. May take Delsym Cough Suppressant at bedtime for nighttime cough.  Follow-up with family doctor if not improving about10 days.     Kandra Nicolas, MD 03/14/15 2016

## 2015-03-10 NOTE — ED Notes (Signed)
Reports onset of congestion, cough, hoarseness, sore throat worsening over past 5 days. Also reports severe 'white coat hypertension'.

## 2015-03-16 ENCOUNTER — Telehealth: Payer: Self-pay

## 2016-10-23 ENCOUNTER — Encounter: Payer: Self-pay | Admitting: Family Medicine

## 2016-10-23 DIAGNOSIS — E1169 Type 2 diabetes mellitus with other specified complication: Secondary | ICD-10-CM | POA: Insufficient documentation

## 2016-10-23 DIAGNOSIS — I10 Essential (primary) hypertension: Secondary | ICD-10-CM | POA: Insufficient documentation

## 2016-10-23 DIAGNOSIS — F411 Generalized anxiety disorder: Secondary | ICD-10-CM | POA: Insufficient documentation

## 2016-10-23 DIAGNOSIS — K219 Gastro-esophageal reflux disease without esophagitis: Secondary | ICD-10-CM | POA: Insufficient documentation

## 2016-10-23 DIAGNOSIS — E785 Hyperlipidemia, unspecified: Secondary | ICD-10-CM

## 2016-10-23 DIAGNOSIS — G4700 Insomnia, unspecified: Secondary | ICD-10-CM | POA: Insufficient documentation

## 2016-10-24 ENCOUNTER — Ambulatory Visit (INDEPENDENT_AMBULATORY_CARE_PROVIDER_SITE_OTHER): Payer: Medicare Other | Admitting: Family Medicine

## 2016-10-24 ENCOUNTER — Encounter: Payer: Self-pay | Admitting: Family Medicine

## 2016-10-24 VITALS — BP 138/80 | HR 70 | Resp 12 | Ht 65.0 in | Wt 185.4 lb

## 2016-10-24 DIAGNOSIS — I499 Cardiac arrhythmia, unspecified: Secondary | ICD-10-CM | POA: Diagnosis not present

## 2016-10-24 DIAGNOSIS — N183 Chronic kidney disease, stage 3 unspecified: Secondary | ICD-10-CM

## 2016-10-24 DIAGNOSIS — R0609 Other forms of dyspnea: Secondary | ICD-10-CM

## 2016-10-24 DIAGNOSIS — R600 Localized edema: Secondary | ICD-10-CM

## 2016-10-24 DIAGNOSIS — N1832 Chronic kidney disease, stage 3b: Secondary | ICD-10-CM | POA: Insufficient documentation

## 2016-10-24 DIAGNOSIS — I4891 Unspecified atrial fibrillation: Secondary | ICD-10-CM | POA: Diagnosis not present

## 2016-10-24 DIAGNOSIS — I509 Heart failure, unspecified: Secondary | ICD-10-CM | POA: Diagnosis not present

## 2016-10-24 DIAGNOSIS — I10 Essential (primary) hypertension: Secondary | ICD-10-CM | POA: Diagnosis not present

## 2016-10-24 LAB — BRAIN NATRIURETIC PEPTIDE: Pro B Natriuretic peptide (BNP): 1313 pg/mL — ABNORMAL HIGH (ref 0.0–100.0)

## 2016-10-24 LAB — VITAMIN D 25 HYDROXY (VIT D DEFICIENCY, FRACTURES): VITD: 67.39 ng/mL (ref 30.00–100.00)

## 2016-10-24 LAB — CBC
HCT: 35.2 % — ABNORMAL LOW (ref 39.0–52.0)
Hemoglobin: 11.5 g/dL — ABNORMAL LOW (ref 13.0–17.0)
MCHC: 32.7 g/dL (ref 30.0–36.0)
MCV: 104.3 fl — AB (ref 78.0–100.0)
PLATELETS: 235 10*3/uL (ref 150.0–400.0)
RBC: 3.38 Mil/uL — AB (ref 4.22–5.81)
RDW: 13.9 % (ref 11.5–15.5)
WBC: 7.6 10*3/uL (ref 4.0–10.5)

## 2016-10-24 MED ORDER — FUROSEMIDE 40 MG PO TABS: ORAL_TABLET | ORAL | 2 refills | Status: DC

## 2016-10-24 MED ORDER — APIXABAN 5 MG PO TABS: 5.0000 mg | ORAL_TABLET | Freq: Two times a day (BID) | ORAL | 1 refills | Status: DC

## 2016-10-24 NOTE — Patient Instructions (Signed)
A few things to remember from today's visit:   Irregular heart rate - Plan: EKG 12-Lead, Brain Natriuretic Peptide  Exertional dyspnea - Plan: Brain Natriuretic Peptide, Ambulatory referral to Cardiology  Bilateral lower extremity edema - Plan: Brain Natriuretic Peptide  Hypertension, essential, benign  CKD (chronic kidney disease), stage III - Plan: VITAMIN D 25 Hydroxy (Vit-D Deficiency, Fractures)  Atrial fibrillation, unspecified type ([place]) - Plan: apixaban (ELIQUIS) 5 MG TABS tablet, Ambulatory referral to Cardiology  Atrial Fibrillation Atrial fibrillation is a type of irregular or rapid heartbeat (arrhythmia). In atrial fibrillation, the heart quivers continuously in a chaotic pattern. This occurs when parts of the heart receive disorganized signals that make the heart unable to pump blood normally. This can increase the risk for stroke, heart failure, and other heart-related conditions. There are different types of atrial fibrillation, including:  Paroxysmal atrial fibrillation. This type starts suddenly, and it usually stops on its own shortly after it starts.  Persistent atrial fibrillation. This type often lasts longer than a week. It may stop on its own or with treatment.  Long-lasting persistent atrial fibrillation. This type lasts longer than 12 months.  Permanent atrial fibrillation. This type does not go away.  Talk with your health care provider to learn about the type of atrial fibrillation that you have. What are the causes? This condition is caused by some heart-related conditions or procedures, including:  A heart attack.  Coronary artery disease.  Heart failure.  Heart valve conditions.  High blood pressure.  Inflammation of the sac that surrounds the heart (pericarditis).  Heart surgery.  Certain heart rhythm disorders, such as Wolf-Parkinson-White syndrome.  Other causes include:  Pneumonia.  Obstructive sleep apnea.  Blockage of an artery  in the lungs (pulmonary embolism, or PE).  Lung cancer.  Chronic lung disease.  Thyroid problems, especially if the thyroid is overactive (hyperthyroidism).  Caffeine.  Excessive alcohol use or illegal drug use.  Use of some medicines, including certain decongestants and diet pills.  Sometimes, the cause cannot be found. What increases the risk? This condition is more likely to develop in:  People who are older in age.  People who smoke.  People who have diabetes mellitus.  People who are overweight (obese).  Athletes who exercise vigorously.  What are the signs or symptoms? Symptoms of this condition include:  A feeling that your heart is beating rapidly or irregularly.  A feeling of discomfort or pain in your chest.  Shortness of breath.  Sudden light-headedness or weakness.  Getting tired easily during exercise.  In some cases, there are no symptoms. How is this diagnosed? Your health care provider may be able to detect atrial fibrillation when taking your pulse. If detected, this condition may be diagnosed with:  An electrocardiogram (ECG).  A Holter monitor test that records your heartbeat patterns over a 24-hour period.  Transthoracic echocardiogram (TTE) to evaluate how blood flows through your heart.  Transesophageal echocardiogram (TEE) to view more detailed images of your heart.  A stress test.  Imaging tests, such as a CT scan or chest X-ray.  Blood tests.  How is this treated? The main goals of treatment are to prevent blood clots from forming and to keep your heart beating at a normal rate and rhythm. The type of treatment that you receive depends on many factors, such as your underlying medical conditions and how you feel when you are experiencing atrial fibrillation. This condition may be treated with:  Medicine to slow down the  heart rate, bring the heart's rhythm back to normal, or prevent clots from forming.  Electrical cardioversion.  This is a procedure that resets your heart's rhythm by delivering a controlled, low-energy shock to the heart through your skin.  Different types of ablation, such as catheter ablation, catheter ablation with pacemaker, or surgical ablation. These procedures destroy the heart tissues that send abnormal signals. When the pacemaker is used, it is placed under your skin to help your heart beat in a regular rhythm.  Follow these instructions at home:  Take over-the counter and prescription medicines only as told by your health care provider.  If your health care provider prescribed a blood-thinning medicine (anticoagulant), take it exactly as told. Taking too much blood-thinning medicine can cause bleeding. If you do not take enough blood-thinning medicine, you will not have the protection that you need against stroke and other problems.  Do not use tobacco products, including cigarettes, chewing tobacco, and e-cigarettes. If you need help quitting, ask your health care provider.  If you have obstructive sleep apnea, manage your condition as told by your health care provider.  Do not drink alcohol.  Do not drink beverages that contain caffeine, such as coffee, soda, and tea.  Maintain a healthy weight. Do not use diet pills unless your health care provider approves. Diet pills may make heart problems worse.  Follow diet instructions as told by your health care provider.  Exercise regularly as told by your health care provider.  Keep all follow-up visits as told by your health care provider. This is important. How is this prevented?  Avoid drinking beverages that contain caffeine or alcohol.  Avoid certain medicines, especially medicines that are used for breathing problems.  Avoid certain herbs and herbal medicines, such as those that contain ephedra or ginseng.  Do not use illegal drugs, such as cocaine and amphetamines.  Do not smoke.  Manage your high blood pressure. Contact a  health care provider if:  You notice a change in the rate, rhythm, or strength of your heartbeat.  You are taking an anticoagulant and you notice increased bruising.  You tire more easily when you exercise or exert yourself. Get help right away if:  You have chest pain, abdominal pain, sweating, or weakness.  You feel nauseous.  You notice blood in your vomit, bowel movement, or urine.  You have shortness of breath.  You suddenly have swollen feet and ankles.  You feel dizzy.  You have sudden weakness or numbness of the face, arm, or leg, especially on one side of the body.  You have trouble speaking, trouble understanding, or both (aphasia).  Your face or your eyelid droops on one side. These symptoms may represent a serious problem that is an emergency. Do not wait to see if the symptoms will go away. Get medical help right away. Call your local emergency services (911 in the [place] Do not drive yourself to the hospital. This information is not intended to replace advice given to you by your health care provider. Make sure you discuss any questions you have with your health care provider. Document Released: 01/20/2005 Document Revised: 05/30/2015 Document Reviewed: 05/17/2014 Elsevier Interactive Patient Education  2017 [place].   Apixaban oral tablets What is this medicine? APIXABAN (a PIX a ban) is an anticoagulant (blood thinner). It is used to lower the chance of stroke in people with a medical condition called atrial fibrillation. It is also used to treat or prevent blood clots in the lungs  or in the veins. This medicine may be used for other purposes; ask your health care provider or pharmacist if you have questions. COMMON BRAND NAME(S): Eliquis What should I tell my health care provider before I take this medicine? They need to know if you have any of these conditions: -bleeding disorders -bleeding in the brain -blood in your stools (black or tarry stools) or  if you have blood in your vomit -history of stomach bleeding -kidney disease -liver disease -mechanical heart valve -an unusual or allergic reaction to apixaban, other medicines, foods, dyes, or preservatives -pregnant or trying to get pregnant -breast-feeding How should I use this medicine? Take this medicine by mouth with a glass of water. Follow the directions on the prescription label. You can take it with or without food. If it upsets your stomach, take it with food. Take your medicine at regular intervals. Do not take it more often than directed. Do not stop taking except on your doctor's advice. Stopping this medicine may increase your risk of a blot clot. Be sure to refill your prescription before you run out of medicine. Talk to your pediatrician regarding the use of this medicine in children. Special care may be needed. Overdosage: If you think you have taken too much of this medicine contact a poison control center or emergency room at once. NOTE: This medicine is only for you. Do not share this medicine with others. What if I miss a dose? If you miss a dose, take it as soon as you can. If it is almost time for your next dose, take only that dose. Do not take double or extra doses. What may interact with this medicine? This medicine may interact with the following: -aspirin and aspirin-like medicines -certain medicines for fungal infections like ketoconazole and itraconazole -certain medicines for seizures like carbamazepine and phenytoin -certain medicines that treat or prevent blood clots like warfarin, enoxaparin, and dalteparin -clarithromycin -NSAIDs, medicines for pain and inflammation, like ibuprofen or naproxen -rifampin -ritonavir -St. John's wort This list may not describe all possible interactions. Give your health care provider a list of all the medicines, herbs, non-prescription drugs, or dietary supplements you use. Also tell them if you smoke, drink alcohol, or use  illegal drugs. Some items may interact with your medicine. What should I watch for while using this medicine? Visit your doctor or health care professional for regular checks on your progress. Notify your doctor or health care professional and seek emergency treatment if you develop breathing problems; changes in vision; chest pain; severe, sudden headache; pain, swelling, warmth in the leg; trouble speaking; sudden numbness or weakness of the face, arm or leg. These can be signs that your condition has gotten worse. If you are going to have surgery or other procedure, tell your doctor that you are taking this medicine. What side effects may I notice from receiving this medicine? Side effects that you should report to your doctor or health care professional as soon as possible: -allergic reactions like skin rash, itching or hives, swelling of the face, lips, or tongue -signs and symptoms of bleeding such as bloody or black, tarry stools; red or dark-brown urine; spitting up blood or brown material that looks like coffee grounds; red spots on the skin; unusual bruising or bleeding from the eye, gums, or nose This list may not describe all possible side effects. Call your doctor for medical advice about side effects. You may report side effects to FDA at 1-800-FDA-1088. Where should I keep  my medicine? Keep out of the reach of children. Store at room temperature between 20 and 25 degrees C (68 and 77 degrees F). Throw away any unused medicine after the expiration date. NOTE: This sheet is a summary. It may not cover all possible information. If you have questions about this medicine, talk to your doctor, pharmacist, or health care provider.  2018 Elsevier/Gold Standard (2015-08-13 11:54:23)  Please be sure medication list is accurate. If a new problem present, please set up appointment sooner than planned today.

## 2016-10-24 NOTE — Progress Notes (Signed)
HPI:   [name] is a 76 y.o. male, who is here today with his wife to establish care.  Former PCP: N/A Last preventive routine visit: 1-2 years ago.  Chronic medical problems: DM II, hypertension, anxiety, GERD, glaucoma, hyperlipidemia, CKD III, and insomnia among some. DM II, he follows with Dr [name] [name], last HgA1C was 5.8 on 10/01/16. Generalized anxiety disorder: He follows with Dr. Laurance [name] every 3-4 months. He denies depressed mood or suicidal thoughts.  HTN  Dx 30+ years ago. He is on Avapro 300 mg and Metoprolol Succinate 200 mg daily Eye exam: Reporting one done recently. He follows low salt diet.  10/01/16 he had labs done:  Glucose 107 (H) 65 - 99 mg/dL  BUN 17 8 - 27 mg/dL  Creatinine, Serum 1.44 (H) 0.76 - 1.27 mg/dL  eGFR If NonAfrican American 47 (L) >59 mL/min/1.73  eGFR If African American 54 (L) >59 mL/min/1.73  BUN/Creatinine Ratio 12 10 - 24   Sodium 141 134 - 144 mmol/L  Potassium 4.9 3.5 - 5.2 mmol/L  Chloride 104 96 - 106 mmol/L  CO2 22 20 - 29 mmol/L  CALCIUM 9.4 8.6 - 10.2 mg/dL  Total Protein 6.2 6.0 - 8.5 g/dL  Albumin, Serum 3.9 3.5 - 4.8 g/dL  Globulin, Total 2.3 1.5 - 4.5 g/dL  Albumin/Globulin Ratio 1.7 1.2 - 2.2   Total Bilirubin 0.4 0.0 - 1.2 mg/dL  Alkaline Phosphatase 125 (H) 39 - 117 IU/L  AST 27 0 - 40 IU/L  ALT (SGPT) 23 0 - 44 IU/L    CKD III  He denies foam in the urine, decreased urine output, or gross hematuria.  HLD:  Lipitor 80 mg. Hx of TIA, he takes Aspirin 81 mg daily. He is tolerating medication well. Last lipid panel 09/22/2016.  Cholesterol, Total 116 100 - 199 mg/dL  Triglycerides 165 (H) 0 - 149 mg/dL  HDL 51 >39 mg/dL  VLDL Cholesterol Cal 33 5 - 40 mg/dL  LDL Calculated 32     Concerns today:  His wife has some concerns today, he doesn't think there is a problem.  LE edema: Peri ankle edema for a few months.  Worse at the end of the day. He denies lower extremity pain,  erythema, or claudication. He is not sure about exacerbating or alleviating factors.   + Dyspnea with exertion, which he attributes to allergies. Exacerbated by walking hills or walking fast,noted by wife about a year ago. Seems stable. He denies associated diaphoresis, chest pain, palpitations, or lightheadedness. He also denies cough or wheezing. No Hx of tobacco use.  He denies orthopnea or PND. Nocturia x 1-2 , which he states that he has"always" had this problem, stable.  Echo 10/2013:  The left ventricle is normal in size. There is mild concentric left ventricular hypertrophy with normal wall motion and ejection fraction 65- 70%. Grade I mild diastolic dysfunction; abnormal relaxation pattern.  Today during examination I noted irregular heart rate. According to patient, he was told he had a heart murmur. He denies Hx of irregular heart rate, denies Hx of atrial fib.  TSH was 3.0 on 09/30/16.   No Hx of OSA, he states that he sleeps well.   Review of Systems  Constitutional: Negative for activity change, appetite change, fatigue, fever and unexpected weight change.  HENT: Negative for mouth sores, nosebleeds, sore throat and trouble swallowing.   Eyes: Negative for redness and visual disturbance.  Respiratory: Positive for shortness of breath.  Negative for apnea, cough and wheezing.   Cardiovascular: Positive for leg swelling. Negative for chest pain and palpitations.  Gastrointestinal: Negative for abdominal pain, nausea and vomiting.       No changes in bowel habits.  Endocrine: Negative for cold intolerance and heat intolerance.  Genitourinary: Negative for decreased urine volume, dysuria and hematuria.  Musculoskeletal: Positive for gait problem. Negative for myalgias.  Skin: Negative for pallor and rash.  Allergic/Immunologic: Positive for environmental allergies.  Neurological: Negative for dizziness, syncope, weakness, light-headedness and headaches.  Hematological:  Negative for adenopathy. Does not bruise/bleed easily.  Psychiatric/Behavioral: Negative for confusion, hallucinations and sleep disturbance. The patient is nervous/anxious.       Current Outpatient Prescriptions on File Prior to Visit  Medication Sig Dispense Refill  . ALPRAZolam (XANAX) 0.25 MG tablet Take 0.25 mg by mouth 2 (two) times daily as needed for anxiety.    Marland Kitchen atorvastatin (LIPITOR) 40 MG tablet Take 40 mg by mouth daily.    . Coenzyme Q10 (CO Q 10) 100 MG CAPS Take 1 capsule by mouth daily.    . irbesartan (AVAPRO) 300 MG tablet Take 300 mg by mouth daily.    . metFORMIN (GLUCOPHAGE) 500 MG tablet Take 500 mg by mouth 2 (two) times daily with a meal.    . metoprolol (TOPROL-XL) 200 MG 24 hr tablet Take 200 mg by mouth daily.     No current facility-administered medications on file prior to visit.      Past Medical History:  Diagnosis Date  . Allergy   . Anxiety   . Diabetes mellitus without complication ([place])   . GERD (gastroesophageal reflux disease)   . Glaucoma   . High cholesterol   . Hypertension   . Seasonal allergies    Allergies  Allergen Reactions  . Penicillins Anaphylaxis  . Shellfish Allergy Anaphylaxis  . Fenofibrate     Dr told pt to discontinue med    Family History  Problem Relation Age of Onset  . Alzheimer's disease Mother   . COPD Father   . Cancer Sister        breast    Social History   Social History  . Marital status: Married    Spouse name: N/A  . Number of children: N/A  . Years of education: N/A   Social History Main Topics  . Smoking status: Never Smoker  . Smokeless tobacco: Never Used  . Alcohol use Yes     Comment: a few beers a day  . Drug use: No  . Sexual activity: Not Asked   Other Topics Concern  . None   Social History Narrative  . None    Vitals:   10/24/16 0900  BP: 138/80  Pulse: 70  Resp: 12  SpO2: 96%    Body mass index is 30.85 kg/m.  Physical Exam  Nursing note and vitals  reviewed. Constitutional: He is oriented to person, place, and time. He appears well-developed. No distress.  HENT:  Head: Normocephalic and atraumatic.  Mouth/Throat: Oropharynx is clear and moist and mucous membranes are normal.  Eyes: Pupils are equal, round, and reactive to light. Conjunctivae are normal.  Neck: JVD (mild) present. No tracheal deviation present. No thyroid mass and no thyromegaly present.  Cardiovascular: Normal rate.  An irregular rhythm present.  No murmur heard. Pulses:      Dorsalis pedis pulses are 2+ on the right side, and 2+ on the left side.  Varicose veins LE bilateral, L>R  Respiratory:  Effort normal and breath sounds normal. No respiratory distress.  GI: Soft. He exhibits no mass. There is no hepatomegaly. There is no tenderness.  Musculoskeletal: He exhibits edema (1+ pitting LE edema,bilateral). He exhibits no tenderness.  Lymphadenopathy:    He has no cervical adenopathy.  Neurological: He is alert and oriented to person, place, and time. He has normal strength. Coordination and gait normal.  Skin: Skin is warm. No erythema.  Psychiatric: He has a normal mood and affect. Cognition and memory are normal.  Well groomed, good eye contact.     ASSESSMENT AND PLAN:   Mr. Seiji was seen today for establish care.  Diagnoses and all orders for this visit:  Exertional dyspnea  We discussed possible etiologies: Cardiac, deconditioning, pulmonary among some. It seems to be stable. Further recommendations will be given according to lab results. Since lung auscultation is negative and no associated wheezing or cough I would hold for now on CXR. Instructed about warning signs. F/U in 2 months.  -     Brain Natriuretic Peptide -     Ambulatory referral to Cardiology -     ECHOCARDIOGRAM COMPLETE; Future  Irregular heart rate  EKG today showed atrial fib.  -     EKG 12-Lead -     Brain Natriuretic Peptide -     CBC -     ECHOCARDIOGRAM COMPLETE;  Future  Atrial fibrillation, unspecified type ([place])  Educated about Dx. CHA2DS2/VAS is 6-7 (age,DM II,HTN,TIA, and ? Systolic CHF). After discussion of benefits and risk of anticoagulations as well as risk of stroke, he agrees with trying Eliquis. He will stop Aspirin 81 mg. Continue Metoprolol Succinate. He was clearly instructed about warning signs, he voices understanding. Referral to cardiology was placed today. Further recommendations would be given according to lab results.   -     apixaban (ELIQUIS) 5 MG TABS tablet; Take 1 tablet (5 mg total) by mouth 2 (two) times daily. -     Ambulatory referral to Cardiology -     CBC -     ECHOCARDIOGRAM COMPLETE; Future  Bilateral lower extremity edema  We discussed possible etiologies, on examination today I see some varicose veins, which could exacerbate problem. He denies any orthopnea or PND, on examination mild JVD. For now recommend lower extremity elevation, he may benefit from compression stockings. We will hold on diuretics for now. Further recommendations will be given according to lab results.  -     Brain Natriuretic Peptide  Hypertension, essential, benign  Adequately controlled. No changes in current management. DASH-low diet recommended. Eye exam current.  Congestive heart failure, unspecified HF chronicity, unspecified heart failure type ([place])  Hx of diastolic dysfunction. BNP pending at this time.  -     Brain Natriuretic Peptide  CKD (chronic kidney disease), stage III  Stable. Continue Avapro. Low salt diet and avoid NSAID's. Adequate BP and DM control.   -     VITAMIN D 25 Hydroxy (Vit-D Deficiency, Fractures) -     CBC  Other orders  BNP: Elevated at 1.313. Echo ordered and Furosemide recommended. He has appt with Dr [name] 10/27/16.   -     furosemide (LASIX) 40 MG tablet; 2 tabs daily for 5 days then once daily.      Corwyn Vora G. Martinique, MD  [facility]. [place]  office.

## 2016-10-27 ENCOUNTER — Ambulatory Visit (INDEPENDENT_AMBULATORY_CARE_PROVIDER_SITE_OTHER): Payer: Medicare Other | Admitting: Cardiology

## 2016-10-27 ENCOUNTER — Encounter: Payer: Self-pay | Admitting: Cardiology

## 2016-10-27 VITALS — BP 152/72 | HR 88 | Ht 65.0 in | Wt 184.0 lb

## 2016-10-27 DIAGNOSIS — R0989 Other specified symptoms and signs involving the circulatory and respiratory systems: Secondary | ICD-10-CM | POA: Diagnosis not present

## 2016-10-27 DIAGNOSIS — I481 Persistent atrial fibrillation: Secondary | ICD-10-CM | POA: Diagnosis not present

## 2016-10-27 DIAGNOSIS — I4819 Other persistent atrial fibrillation: Secondary | ICD-10-CM

## 2016-10-27 DIAGNOSIS — R0609 Other forms of dyspnea: Secondary | ICD-10-CM | POA: Diagnosis not present

## 2016-10-27 DIAGNOSIS — R011 Cardiac murmur, unspecified: Secondary | ICD-10-CM

## 2016-10-27 NOTE — Progress Notes (Signed)
Referring-Betty G Martinique M.D. Reason for referral-dyspnea and atrial fibrillation  HPI: 76 year old male for evaluation of dyspnea and atrial fibrillation at request of Betty G Martinique M.D. Laboratories from August 2018 showed creatinine 1.44, potassium 4.9, And TSH 3.0. Hemoglobin September 2018 11.5. Patient has noticed fatigue and dyspnea on exertion for approximately 1-2 months. He has also had mild pedal edema. No chest pain, palpitations or syncope. He was seen by his primary care physician and an electrocardiogram showed atrial fibrillation. Cardiology now asked to evaluate.  Current Outpatient Prescriptions  Medication Sig Dispense Refill  . ALPRAZolam (XANAX) 0.25 MG tablet Take 0.25 mg by mouth 2 (two) times daily as needed for anxiety.    Marland Kitchen apixaban (ELIQUIS) 5 MG TABS tablet Take 1 tablet (5 mg total) by mouth 2 (two) times daily. 60 tablet 1  . atorvastatin (LIPITOR) 80 MG tablet Take 80 mg by mouth daily.     . Coenzyme Q10 (CO Q 10) 100 MG CAPS Take 1 capsule by mouth daily.    . furosemide (LASIX) 40 MG tablet 2 tabs daily for 5 days then once daily. 40 tablet 2  . irbesartan (AVAPRO) 300 MG tablet Take 300 mg by mouth daily.    . metFORMIN (GLUCOPHAGE) 500 MG tablet Take 500 mg by mouth 2 (two) times daily with a meal.    . metoprolol (TOPROL-XL) 200 MG 24 hr tablet Take 200 mg by mouth daily.    . sertraline (ZOLOFT) 50 MG tablet Take 50 mg by mouth daily.     No current facility-administered medications for this visit.     Allergies  Allergen Reactions  . Penicillins Anaphylaxis  . Shellfish Allergy Anaphylaxis  . Fenofibrate     Dr told pt to discontinue med     Past Medical History:  Diagnosis Date  . Allergy   . Anxiety   . Diabetes mellitus without complication ([place])   . GERD (gastroesophageal reflux disease)   . Glaucoma   . High cholesterol   . Hypertension   . Renal insufficiency   . Seasonal allergies     Past Surgical History:  Procedure  Laterality Date  . KNEE SURGERY    . TONSILLECTOMY AND ADENOIDECTOMY  1943    Social History   Social History  . Marital status: Married    Spouse name: N/A  . Number of children: 2  . Years of education: N/A   Occupational History  . Not on file.   Social History Main Topics  . Smoking status: Former Research scientist (life sciences)  . Smokeless tobacco: Never Used  . Alcohol use Yes     Comment: a few beers a day  . Drug use: No  . Sexual activity: Not on file   Other Topics Concern  . Not on file   Social History Narrative  . No narrative on file    Family History  Problem Relation Age of Onset  . Alzheimer's disease Mother   . COPD Father   . Cancer Sister        breast    ROS: no fevers or chills, productive cough, hemoptysis, dysphasia, odynophagia, melena, hematochezia, dysuria, hematuria, rash, seizure activity, orthopnea, PND, pedal edema, claudication. Remaining systems are negative.  Physical Exam:   Blood pressure (!) 152/72, pulse 88, height 5\' 5"  (1.651 m), weight 83.5 kg (184 lb), SpO2 98 %.  General:  Well developed/well nourished in NAD Skin warm/dry Patient not depressed No peripheral clubbing Back-normal HEENT-normal/normal eyelids Neck supple/normal carotid  upstroke bilaterally; no bruits; no JVD; no thyromegaly chest - CTA/ normal expansion CV - irregular/normal S1 and S2; no rubs or gallops;  PMI nondisplaced; 2/6 systolic murmur apex Abdomen -NT/ND, no HSM, no mass, + bowel sounds, no bruit 2+ femoral pulses, no bruits Ext-trace edema, no chords, 2+ DP Neuro-grossly nonfocal  ECG - 10/24/2016-atrial fibrillation. Atrial fibrillation new since 02/25/2014. personally reviewed  A/P  1 persistent atrial fibrillation-patient has new diagnosis of atrial fibrillation. The duration is unclear but based on symptoms likely occurred in the last 2 months. Previous electrocardiogram from 2016 showed sinus rhythm. I think he is symptomatic from this with increased  dyspnea on exertion and fatigue. Continue Toprol for rate control. Continue apixaban. CHADSvasc 6. Recent TSH normal. I will arrange an echocardiogram to assess LV function and severity of mitral regurgitation. Given that he is symptomatic or strategy will need to be rhythm control. Once he has been therapeutically anticoagulated for 3 consecutive weeks we will arrange cardioversion. If he does not hold sinus rhythm he would need an antiarrhythmic.  2 mitral regurgitation murmur-I will arrange an echocardiogram to further assess. I am concerned that his mitral regurgitation may have caused left atrial dilatation and leg to his atrial fibrillation. Depending on severity we will decide on whether he needs a transesophageal echocardiogram.  3 bruit-schedule abdominal ultrasound to exclude aneurysm.  4 hypertension-blood pressure is mildly elevated. I have asked him to track his blood pressure at home and we will advance regimen as needed.   5 hyperlipidemia-continue statin.  6 chronic stage III kidney disease-patient was recently started on a diuretic for lower extremity edema. Continue Lasix 40 mg daily. Check potassium and renal function.    Kirk Ruths, MD

## 2016-10-27 NOTE — Patient Instructions (Signed)
Medication Instructions:   NO CHANGE  Lab Work:  Your physician recommends that you HAVE LAB WORK TODAY  Testing/Procedures:  Your physician has requested that you have an echocardiogram. Echocardiography is a painless test that uses sound waves to create images of your heart. It provides your doctor with information about the size and shape of your heart and how well your heart's chambers and valves are working. This procedure takes approximately one hour. There are no restrictions for this procedure.AS SCHEDULED  Your physician has requested that you have an abdominal aorta duplex. During this test, an ultrasound is used to evaluate the aorta. Allow 30 minutes for this exam. Do not eat after midnight the day before and avoid carbonated beverages     Follow-Up:  Your physician recommends that you schedule a follow-up appointment in: [place]

## 2016-10-28 ENCOUNTER — Telehealth: Payer: Self-pay | Admitting: *Deleted

## 2016-10-28 ENCOUNTER — Ambulatory Visit: Payer: Medicare Other | Admitting: Family Medicine

## 2016-10-28 LAB — BASIC METABOLIC PANEL
BUN/Creatinine Ratio: 17 (ref 10–24)
BUN: 29 mg/dL — ABNORMAL HIGH (ref 8–27)
CHLORIDE: 96 mmol/L (ref 96–106)
CO2: 26 mmol/L (ref 20–29)
Calcium: 9.5 mg/dL (ref 8.6–10.2)
Creatinine, Ser: 1.67 mg/dL — ABNORMAL HIGH (ref 0.76–1.27)
GFR calc non Af Amer: 39 mL/min/{1.73_m2} — ABNORMAL LOW (ref >59)
GFR, EST AFRICAN AMERICAN: 45 mL/min/{1.73_m2} — AB (ref >59)
GLUCOSE: 139 mg/dL — AB (ref 65–99)
POTASSIUM: 4.4 mmol/L (ref 3.5–5.2)
Sodium: 138 mmol/L (ref 134–144)

## 2016-10-28 MED ORDER — FUROSEMIDE 40 MG PO TABS: 20.0000 mg | ORAL_TABLET | Freq: Every day | ORAL | 2 refills | Status: DC

## 2016-10-28 NOTE — Telephone Encounter (Signed)
-----   Message from Lelon Perla, MD sent at 10/28/2016  7:25 AM EDT ----- Would change lasix to 20 mg daily with additional 20 mg daily as needed Kirk Ruths

## 2016-10-28 NOTE — Telephone Encounter (Signed)
Spoke with [name] [name], aware of lab results and medication change.

## 2016-10-31 ENCOUNTER — Other Ambulatory Visit: Payer: Self-pay

## 2016-10-31 ENCOUNTER — Ambulatory Visit (HOSPITAL_COMMUNITY): Payer: Medicare Other | Attending: Cardiovascular Disease

## 2016-10-31 DIAGNOSIS — E119 Type 2 diabetes mellitus without complications: Secondary | ICD-10-CM | POA: Insufficient documentation

## 2016-10-31 DIAGNOSIS — I10 Essential (primary) hypertension: Secondary | ICD-10-CM | POA: Insufficient documentation

## 2016-10-31 DIAGNOSIS — R0609 Other forms of dyspnea: Secondary | ICD-10-CM | POA: Insufficient documentation

## 2016-10-31 DIAGNOSIS — Z87891 Personal history of nicotine dependence: Secondary | ICD-10-CM | POA: Insufficient documentation

## 2016-10-31 DIAGNOSIS — F419 Anxiety disorder, unspecified: Secondary | ICD-10-CM | POA: Insufficient documentation

## 2016-10-31 DIAGNOSIS — E7800 Pure hypercholesterolemia, unspecified: Secondary | ICD-10-CM | POA: Diagnosis not present

## 2016-10-31 DIAGNOSIS — I071 Rheumatic tricuspid insufficiency: Secondary | ICD-10-CM | POA: Diagnosis not present

## 2016-10-31 DIAGNOSIS — I4891 Unspecified atrial fibrillation: Secondary | ICD-10-CM | POA: Diagnosis not present

## 2016-10-31 DIAGNOSIS — N289 Disorder of kidney and ureter, unspecified: Secondary | ICD-10-CM | POA: Insufficient documentation

## 2016-10-31 DIAGNOSIS — I499 Cardiac arrhythmia, unspecified: Secondary | ICD-10-CM | POA: Insufficient documentation

## 2016-10-31 DIAGNOSIS — I371 Nonrheumatic pulmonary valve insufficiency: Secondary | ICD-10-CM | POA: Insufficient documentation

## 2016-11-02 ENCOUNTER — Encounter: Payer: Self-pay | Admitting: Family Medicine

## 2016-11-04 ENCOUNTER — Telehealth: Payer: Self-pay | Admitting: Cardiology

## 2016-11-04 NOTE — Telephone Encounter (Signed)
Ok to travel [employer]

## 2016-11-04 NOTE — Telephone Encounter (Signed)
Spoke with karen,(DPR) she states that she and pt are going to [place] and this is at least 12+ drive and is wondering if this is ok for pt?

## 2016-11-04 NOTE — Telephone Encounter (Signed)
Notified Karen,(DPR) she states that they stop frequently to walk and "stretch the legs"

## 2016-11-04 NOTE — Telephone Encounter (Signed)
°  New Prob  Calling to verify if patient has any travel restrictions. Please call.

## 2016-11-07 ENCOUNTER — Ambulatory Visit (HOSPITAL_COMMUNITY)
Admission: RE | Admit: 2016-11-07 | Discharge: 2016-11-07 | Disposition: A | Discharge status: Home / Self Care | Payer: Medicare Other | Source: Ambulatory Visit | Attending: Cardiology | Admitting: Cardiology

## 2016-11-07 ENCOUNTER — Encounter: Payer: Self-pay | Admitting: Family Medicine

## 2016-11-07 ENCOUNTER — Telehealth: Payer: Self-pay | Admitting: Cardiology

## 2016-11-07 DIAGNOSIS — R0989 Other specified symptoms and signs involving the circulatory and respiratory systems: Secondary | ICD-10-CM

## 2016-11-07 DIAGNOSIS — I700 Atherosclerosis of aorta: Secondary | ICD-10-CM | POA: Diagnosis not present

## 2016-11-07 NOTE — Telephone Encounter (Signed)
Patient walked in requesting suggestions in place of Aleve since he is on Eliquis and has stage 3 kidney disease. It is recommended that he can take Tylenol in place of Aleve.  Called patient's wife (DPR) and notified her that he can take Tylenol in place of Aleve (any strength, as directed)

## 2016-12-01 DIAGNOSIS — J302 Other seasonal allergic rhinitis: Secondary | ICD-10-CM | POA: Insufficient documentation

## 2016-12-01 DIAGNOSIS — I10 Essential (primary) hypertension: Secondary | ICD-10-CM | POA: Insufficient documentation

## 2016-12-01 DIAGNOSIS — E1121 Type 2 diabetes mellitus with diabetic nephropathy: Secondary | ICD-10-CM | POA: Insufficient documentation

## 2016-12-01 DIAGNOSIS — E7800 Pure hypercholesterolemia, unspecified: Secondary | ICD-10-CM | POA: Insufficient documentation

## 2016-12-01 DIAGNOSIS — F419 Anxiety disorder, unspecified: Secondary | ICD-10-CM | POA: Insufficient documentation

## 2016-12-01 DIAGNOSIS — G459 Transient cerebral ischemic attack, unspecified: Secondary | ICD-10-CM | POA: Insufficient documentation

## 2016-12-01 DIAGNOSIS — N289 Disorder of kidney and ureter, unspecified: Secondary | ICD-10-CM | POA: Insufficient documentation

## 2016-12-01 DIAGNOSIS — H409 Unspecified glaucoma: Secondary | ICD-10-CM | POA: Insufficient documentation

## 2016-12-01 DIAGNOSIS — T7840XA Allergy, unspecified, initial encounter: Secondary | ICD-10-CM | POA: Insufficient documentation

## 2016-12-04 NOTE — H&P (View-Only) (Signed)
HPI: FU atrial fibrillation. Laboratories from August 2018 showed creatinine 1.44, potassium 4.9, And TSH 3.0. Hemoglobin September 2018 11.5. Echocardiogram September 2018 showed normal LV function, restricted posterior mitral valve leaflet with probable mild mitral regurgitation (though difficult to quantitate due to eccentric nature of jet) and moderate left atrial enlargement. Abdominal ultrasound October 2018 showed no aneurysm. Plan at last office visit was to continue anticoagulation and proceed with cardioversion if atrial fibrillation persisted. Since last seen, he does have some dyspnea on exertion. No orthopnea, PND, chest pain, palpitations, syncope or bleeding. Mild pedal edema improved compared to previous.  Current Outpatient Medications  Medication Sig Dispense Refill  . ALPRAZolam (XANAX) 0.25 MG tablet Take 0.25 mg by mouth 2 (two) times daily as needed for anxiety.    Marland Kitchen atorvastatin (LIPITOR) 80 MG tablet Take 80 mg by mouth daily.     . Coenzyme Q10 (CO Q 10) 100 MG CAPS Take 1 capsule by mouth daily.    Marland Kitchen ELIQUIS 5 MG TABS tablet TAKE 1 TABLET BY MOUTH TWICE A DAY 60 tablet 1  . furosemide (LASIX) 40 MG tablet Take 0.5 tablets (20 mg total) by mouth daily. 40 tablet 2  . irbesartan (AVAPRO) 300 MG tablet Take 300 mg by mouth daily.    . metFORMIN (GLUCOPHAGE) 500 MG tablet Take 500 mg by mouth 2 (two) times daily with a meal.    . metoprolol (TOPROL-XL) 200 MG 24 hr tablet Take 200 mg by mouth daily.    . sertraline (ZOLOFT) 50 MG tablet Take 50 mg by mouth daily.     No current facility-administered medications for this visit.      Past Medical History:  Diagnosis Date  . Allergy   . Anxiety   . Diabetes mellitus without complication ([place])   . GERD (gastroesophageal reflux disease)   . Glaucoma   . High cholesterol   . Hypertension   . Renal insufficiency   . Seasonal allergies   . TIA (transient ischemic attack)     Past Surgical History:  Procedure  Laterality Date  . KNEE SURGERY    . TONSILLECTOMY AND ADENOIDECTOMY  1943    Social History   Socioeconomic History  . Marital status: Married    Spouse name: Not on file  . Number of children: 2  . Years of education: Not on file  . Highest education level: Not on file  Social Needs  . Financial resource strain: Not on file  . Food insecurity - worry: Not on file  . Food insecurity - inability: Not on file  . Transportation needs - medical: Not on file  . Transportation needs - non-medical: Not on file  Occupational History  . Not on file  Tobacco Use  . Smoking status: Former Research scientist (life sciences)  . Smokeless tobacco: Never Used  Substance and Sexual Activity  . Alcohol use: Yes    Comment: a few beers a day  . Drug use: No  . Sexual activity: Not on file  Other Topics Concern  . Not on file  Social History Narrative  . Not on file    Family History  Problem Relation Age of Onset  . Alzheimer's disease Mother   . COPD Father   . Cancer Sister        breast    ROS: no fevers or chills, productive cough, hemoptysis, dysphasia, odynophagia, melena, hematochezia, dysuria, hematuria, rash, seizure activity, orthopnea, PND, claudication. Remaining systems are negative.  Physical Exam:  Well-developed well-nourished in no acute distress.  Skin is warm and dry.  HEENT is normal.  Neck is supple.  Chest is clear to auscultation with normal expansion.  Cardiovascular exam is irregular, 3/6 systolic murmur apex. Abdominal exam nontender or distended. No masses palpated. Extremities show tract to 1+ ankle edema. neuro grossly intact  A/P  1 persistent atrial fibrillation-patient does have dyspnea on exertion and I think this is likely secondary to atrial fibrillation. He has been compliant with apixaban. We will arrange cardioversion to see if his symptoms improved. He will need an antiarrhythmic if he does not hold sinus rhythm. Continue Toprol for rate control.  2 mitral  regurgitation murmur-echocardiogram difficult due to eccentric nature of jet. I'm concerned that mitral regurgitation could be contributing to left atrial enlargement/atrial fibrillation and dyspnea. We will plan a transesophageal echocardiogram to better assess mitral valve morphology in severity of MR.  3 hypertension-blood pressure is controlled. Continue present medications.  4 hyperlipidemia-continue statin.  5 lower extremity edema-improved with the addition of Lasix. Will continue. Repeat BMET.  Kirk Ruths, MD

## 2016-12-04 NOTE — Progress Notes (Signed)
HPI: FU atrial fibrillation. Laboratories from August 2018 showed creatinine 1.44, potassium 4.9, And TSH 3.0. Hemoglobin September 2018 11.5. Echocardiogram September 2018 showed normal LV function, restricted posterior mitral valve leaflet with probable mild mitral regurgitation (though difficult to quantitate due to eccentric nature of jet) and moderate left atrial enlargement. Abdominal ultrasound October 2018 showed no aneurysm. Plan at last office visit was to continue anticoagulation and proceed with cardioversion if atrial fibrillation persisted. Since last seen, he does have some dyspnea on exertion. No orthopnea, PND, chest pain, palpitations, syncope or bleeding. Mild pedal edema improved compared to previous.  Current Outpatient Medications  Medication Sig Dispense Refill  . ALPRAZolam (XANAX) 0.25 MG tablet Take 0.25 mg by mouth 2 (two) times daily as needed for anxiety.    Marland Kitchen atorvastatin (LIPITOR) 80 MG tablet Take 80 mg by mouth daily.     . Coenzyme Q10 (CO Q 10) 100 MG CAPS Take 1 capsule by mouth daily.    Marland Kitchen ELIQUIS 5 MG TABS tablet TAKE 1 TABLET BY MOUTH TWICE A DAY 60 tablet 1  . furosemide (LASIX) 40 MG tablet Take 0.5 tablets (20 mg total) by mouth daily. 40 tablet 2  . irbesartan (AVAPRO) 300 MG tablet Take 300 mg by mouth daily.    . metFORMIN (GLUCOPHAGE) 500 MG tablet Take 500 mg by mouth 2 (two) times daily with a meal.    . metoprolol (TOPROL-XL) 200 MG 24 hr tablet Take 200 mg by mouth daily.    . sertraline (ZOLOFT) 50 MG tablet Take 50 mg by mouth daily.     No current facility-administered medications for this visit.      Past Medical History:  Diagnosis Date  . Allergy   . Anxiety   . Diabetes mellitus without complication ([place])   . GERD (gastroesophageal reflux disease)   . Glaucoma   . High cholesterol   . Hypertension   . Renal insufficiency   . Seasonal allergies   . TIA (transient ischemic attack)     Past Surgical History:  Procedure  Laterality Date  . KNEE SURGERY    . TONSILLECTOMY AND ADENOIDECTOMY  1943    Social History   Socioeconomic History  . Marital status: Married    Spouse name: Not on file  . Number of children: 2  . Years of education: Not on file  . Highest education level: Not on file  Social Needs  . Financial resource strain: Not on file  . Food insecurity - worry: Not on file  . Food insecurity - inability: Not on file  . Transportation needs - medical: Not on file  . Transportation needs - non-medical: Not on file  Occupational History  . Not on file  Tobacco Use  . Smoking status: Former Research scientist (life sciences)  . Smokeless tobacco: Never Used  Substance and Sexual Activity  . Alcohol use: Yes    Comment: a few beers a day  . Drug use: No  . Sexual activity: Not on file  Other Topics Concern  . Not on file  Social History Narrative  . Not on file    Family History  Problem Relation Age of Onset  . Alzheimer's disease Mother   . COPD Father   . Cancer Sister        breast    ROS: no fevers or chills, productive cough, hemoptysis, dysphasia, odynophagia, melena, hematochezia, dysuria, hematuria, rash, seizure activity, orthopnea, PND, claudication. Remaining systems are negative.  Physical Exam:  Well-developed well-nourished in no acute distress.  Skin is warm and dry.  HEENT is normal.  Neck is supple.  Chest is clear to auscultation with normal expansion.  Cardiovascular exam is irregular, 3/6 systolic murmur apex. Abdominal exam nontender or distended. No masses palpated. Extremities show tract to 1+ ankle edema. neuro grossly intact  A/P  1 persistent atrial fibrillation-patient does have dyspnea on exertion and I think this is likely secondary to atrial fibrillation. He has been compliant with apixaban. We will arrange cardioversion to see if his symptoms improved. He will need an antiarrhythmic if he does not hold sinus rhythm. Continue Toprol for rate control.  2 mitral  regurgitation murmur-echocardiogram difficult due to eccentric nature of jet. I'm concerned that mitral regurgitation could be contributing to left atrial enlargement/atrial fibrillation and dyspnea. We will plan a transesophageal echocardiogram to better assess mitral valve morphology in severity of MR.  3 hypertension-blood pressure is controlled. Continue present medications.  4 hyperlipidemia-continue statin.  5 lower extremity edema-improved with the addition of Lasix. Will continue. Repeat BMET.  Kirk Ruths, MD

## 2016-12-14 ENCOUNTER — Other Ambulatory Visit: Payer: Self-pay | Admitting: Family Medicine

## 2016-12-14 DIAGNOSIS — I4891 Unspecified atrial fibrillation: Secondary | ICD-10-CM

## 2016-12-15 ENCOUNTER — Encounter: Payer: Self-pay | Admitting: *Deleted

## 2016-12-15 ENCOUNTER — Encounter: Payer: Self-pay | Admitting: Cardiology

## 2016-12-15 ENCOUNTER — Ambulatory Visit: Payer: Medicare Other | Admitting: Cardiology

## 2016-12-15 ENCOUNTER — Other Ambulatory Visit: Payer: Self-pay | Admitting: *Deleted

## 2016-12-15 VITALS — BP 140/80 | HR 80 | Ht 65.0 in | Wt 185.0 lb

## 2016-12-15 DIAGNOSIS — I4891 Unspecified atrial fibrillation: Secondary | ICD-10-CM

## 2016-12-15 DIAGNOSIS — I4819 Other persistent atrial fibrillation: Secondary | ICD-10-CM

## 2016-12-15 DIAGNOSIS — I481 Persistent atrial fibrillation: Secondary | ICD-10-CM | POA: Diagnosis not present

## 2016-12-15 DIAGNOSIS — E7800 Pure hypercholesterolemia, unspecified: Secondary | ICD-10-CM | POA: Diagnosis not present

## 2016-12-15 DIAGNOSIS — I340 Nonrheumatic mitral (valve) insufficiency: Secondary | ICD-10-CM

## 2016-12-15 DIAGNOSIS — I10 Essential (primary) hypertension: Secondary | ICD-10-CM

## 2016-12-15 MED ORDER — APIXABAN 5 MG PO TABS: 5.0000 mg | ORAL_TABLET | Freq: Two times a day (BID) | ORAL | 6 refills | Status: DC

## 2016-12-15 NOTE — Patient Instructions (Signed)
Your physician has requested that you have a TEE/Cardioversion. During a TEE, sound waves are used to create images of your heart. It provides your doctor with information about the size and shape of your heart and how well your heart's chambers and valves are working. In this test, a transducer is attached to the end of a flexible tube that is guided down you throat and into your esophagus (the tube leading from your mouth to your stomach) to get a more detailed image of your heart. Once the TEE has determined that a blood clot is not present, the cardioversion begins. Electrical Cardioversion uses a jolt of electricity to your heart either through paddles or wired patches attached to your chest. This is a controlled, usually prescheduled, procedure. This procedure is done at the hospital and you are not awake during the procedure. You usually go home the day of the procedure. Please see the instruction sheet given to you today for more information.   Your physician recommends that you schedule a follow-up appointment in: [place]  If you need a refill on your cardiac medications before your next appointment, please call your pharmacy.

## 2016-12-16 LAB — BASIC METABOLIC PANEL WITH GFR
BUN/Creatinine Ratio: 13 (ref 10–24)
BUN: 19 mg/dL (ref 8–27)
CO2: 23 mmol/L (ref 20–29)
Calcium: 9.4 mg/dL (ref 8.6–10.2)
Chloride: 101 mmol/L (ref 96–106)
Creatinine, Ser: 1.5 mg/dL — ABNORMAL HIGH (ref 0.76–1.27)
GFR calc Af Amer: 52 mL/min/1.73 — ABNORMAL LOW
GFR calc non Af Amer: 45 mL/min/1.73 — ABNORMAL LOW
Glucose: 169 mg/dL — ABNORMAL HIGH (ref 65–99)
Potassium: 4.8 mmol/L (ref 3.5–5.2)
Sodium: 140 mmol/L (ref 134–144)

## 2017-01-02 ENCOUNTER — Ambulatory Visit (HOSPITAL_COMMUNITY): Payer: Medicare Other | Admitting: Certified Registered Nurse Anesthetist

## 2017-01-02 ENCOUNTER — Encounter (HOSPITAL_COMMUNITY): Payer: Self-pay | Admitting: *Deleted

## 2017-01-02 ENCOUNTER — Encounter (HOSPITAL_COMMUNITY)
Admission: RE | Disposition: A | Discharge status: Home / Self Care | Payer: Self-pay | Source: Ambulatory Visit | Attending: Cardiology

## 2017-01-02 ENCOUNTER — Ambulatory Visit (HOSPITAL_COMMUNITY)
Admission: RE | Admit: 2017-01-02 | Discharge: 2017-01-02 | Disposition: A | Discharge status: Home / Self Care | Payer: Medicare Other | Source: Ambulatory Visit | Attending: Cardiology | Admitting: Cardiology

## 2017-01-02 ENCOUNTER — Ambulatory Visit (HOSPITAL_BASED_OUTPATIENT_CLINIC_OR_DEPARTMENT_OTHER): Payer: Medicare Other

## 2017-01-02 DIAGNOSIS — K219 Gastro-esophageal reflux disease without esophagitis: Secondary | ICD-10-CM | POA: Insufficient documentation

## 2017-01-02 DIAGNOSIS — I481 Persistent atrial fibrillation: Secondary | ICD-10-CM | POA: Diagnosis not present

## 2017-01-02 DIAGNOSIS — E119 Type 2 diabetes mellitus without complications: Secondary | ICD-10-CM | POA: Insufficient documentation

## 2017-01-02 DIAGNOSIS — Z8673 Personal history of transient ischemic attack (TIA), and cerebral infarction without residual deficits: Secondary | ICD-10-CM | POA: Insufficient documentation

## 2017-01-02 DIAGNOSIS — F419 Anxiety disorder, unspecified: Secondary | ICD-10-CM | POA: Diagnosis not present

## 2017-01-02 DIAGNOSIS — Z7984 Long term (current) use of oral hypoglycemic drugs: Secondary | ICD-10-CM | POA: Diagnosis not present

## 2017-01-02 DIAGNOSIS — Z87891 Personal history of nicotine dependence: Secondary | ICD-10-CM | POA: Insufficient documentation

## 2017-01-02 DIAGNOSIS — Z79899 Other long term (current) drug therapy: Secondary | ICD-10-CM | POA: Diagnosis not present

## 2017-01-02 DIAGNOSIS — I340 Nonrheumatic mitral (valve) insufficiency: Secondary | ICD-10-CM | POA: Diagnosis not present

## 2017-01-02 DIAGNOSIS — E7800 Pure hypercholesterolemia, unspecified: Secondary | ICD-10-CM | POA: Insufficient documentation

## 2017-01-02 DIAGNOSIS — R600 Localized edema: Secondary | ICD-10-CM | POA: Diagnosis not present

## 2017-01-02 DIAGNOSIS — Z7901 Long term (current) use of anticoagulants: Secondary | ICD-10-CM | POA: Insufficient documentation

## 2017-01-02 DIAGNOSIS — I4891 Unspecified atrial fibrillation: Secondary | ICD-10-CM | POA: Diagnosis present

## 2017-01-02 DIAGNOSIS — I10 Essential (primary) hypertension: Secondary | ICD-10-CM | POA: Diagnosis not present

## 2017-01-02 DIAGNOSIS — E669 Obesity, unspecified: Secondary | ICD-10-CM | POA: Insufficient documentation

## 2017-01-02 DIAGNOSIS — I4819 Other persistent atrial fibrillation: Secondary | ICD-10-CM

## 2017-01-02 DIAGNOSIS — Z6830 Body mass index (BMI) 30.0-30.9, adult: Secondary | ICD-10-CM | POA: Insufficient documentation

## 2017-01-02 HISTORY — PX: TEE WITHOUT CARDIOVERSION: SHX5443

## 2017-01-02 HISTORY — PX: CARDIOVERSION: SHX1299

## 2017-01-02 LAB — POCT I-STAT 4, (NA,K, GLUC, HGB,HCT)
Glucose, Bld: 123 mg/dL — ABNORMAL HIGH (ref 65–99)
HEMATOCRIT: 31 % — AB (ref 39.0–52.0)
HEMOGLOBIN: 10.5 g/dL — AB (ref 13.0–17.0)
POTASSIUM: 4.2 mmol/L (ref 3.5–5.1)
SODIUM: 140 mmol/L (ref 135–145)

## 2017-01-02 SURGERY — ECHOCARDIOGRAM, TRANSESOPHAGEAL
Anesthesia: Monitor Anesthesia Care

## 2017-01-02 MED ORDER — LIDOCAINE 2% (20 MG/ML) 5 ML SYRINGE
INTRAMUSCULAR | Status: DC | PRN
  Administered 2017-01-02: 100 mg via INTRAVENOUS

## 2017-01-02 MED ORDER — SODIUM CHLORIDE 0.9 % IV SOLN: INTRAVENOUS | Status: DC

## 2017-01-02 MED ORDER — PROPOFOL 10 MG/ML IV BOLUS
INTRAVENOUS | Status: DC | PRN
  Administered 2017-01-02: 10 mg via INTRAVENOUS
  Administered 2017-01-02: 30 mg via INTRAVENOUS
  Administered 2017-01-02: 20 mg via INTRAVENOUS
  Administered 2017-01-02: 10 mg via INTRAVENOUS
  Administered 2017-01-02: 20 mg via INTRAVENOUS
  Administered 2017-01-02: 30 mg via INTRAVENOUS

## 2017-01-02 MED ORDER — ONDANSETRON HCL 4 MG/2ML IJ SOLN
INTRAMUSCULAR | Status: DC | PRN
  Administered 2017-01-02: 4 mg via INTRAVENOUS

## 2017-01-02 MED ORDER — LACTATED RINGERS IV SOLN
INTRAVENOUS | Status: DC | PRN
  Administered 2017-01-02: 08:00:00 via INTRAVENOUS

## 2017-01-02 MED ORDER — PROPOFOL 500 MG/50ML IV EMUL
INTRAVENOUS | Status: DC | PRN
  Administered 2017-01-02: 100 ug/kg/min via INTRAVENOUS

## 2017-01-02 MED ORDER — BUTAMBEN-TETRACAINE-BENZOCAINE 2-2-14 % EX AERO
INHALATION_SPRAY | CUTANEOUS | Status: DC | PRN
  Administered 2017-01-02: 2 via TOPICAL

## 2017-01-02 NOTE — Discharge Instructions (Signed)
TEE ° °YOU HAD AN CARDIAC PROCEDURE TODAY: Refer to the procedure report and other information in the discharge instructions given to you for any specific questions about what was found during the examination. If this information does not answer your questions, please call [facility] office at [phone] to clarify.  ° °DIET: Your first meal following the procedure should be a light meal and then it is ok to progress to your normal diet. A half-sandwich or bowl of soup is an example of a good first meal. Heavy or fried foods are harder to digest and may make you feel nauseous or bloated. Drink plenty of fluids but you should avoid alcoholic beverages for 24 hours. If you had a esophageal dilation, please see attached instructions for diet.  ° °ACTIVITY: Your care partner should take you home directly after the procedure. You should plan to take it easy, moving slowly for the rest of the day. You can resume normal activity the day after the procedure however YOU SHOULD NOT DRIVE, use power tools, machinery or perform tasks that involve climbing or major physical exertion for 24 hours (because of the sedation medicines used during the test).  ° °SYMPTOMS TO REPORT IMMEDIATELY: °A cardiologist can be reached at any hour. Please call [phone] for any of the following symptoms:  °Vomiting of blood or coffee ground material  °New, significant abdominal pain  °New, significant chest pain or pain under the shoulder blades  °Painful or persistently difficult swallowing  °New shortness of breath  °Black, tarry-looking or red, bloody stools ° °FOLLOW UP:  °Please also call with any specific questions about appointments or follow up tests. ° °Electrical Cardioversion, Care After °This sheet gives you information about how to care for yourself after your procedure. Your health care provider may also give you more specific instructions. If you have problems or questions, contact your health care provider. °What can I  expect after the procedure? °After the procedure, it is common to have: °· Some redness on the skin where the shocks were given. ° °Follow these instructions at home: °· Do not drive for 24 hours if you were given a medicine to help you relax (sedative). °· Take over-the-counter and prescription medicines only as told by your health care provider. °· Ask your health care provider how to check your pulse. Check it often. °· Rest for 48 hours after the procedure or as told by your health care provider. °· Avoid or limit your caffeine use as told by your health care provider. °Contact a health care provider if: °· You feel like your heart is beating too quickly or your pulse is not regular. °· You have a serious muscle cramp that does not go away. °Get help right away if: °· You have discomfort in your chest. °· You are dizzy or you feel faint. °· You have trouble breathing or you are short of breath. °· Your speech is slurred. °· You have trouble moving an arm or leg on one side of your body. °· Your fingers or toes turn cold or blue. °This information is not intended to replace advice given to you by your health care provider. Make sure you discuss any questions you have with your health care provider. °Document Released: 11/10/2012 Document Revised: 08/24/2015 Document Reviewed: 07/27/2015 °Elsevier Interactive Patient Education © 2018 [employer] ° °

## 2017-01-02 NOTE — Anesthesia Postprocedure Evaluation (Signed)
Anesthesia Post Note  Patient: Jailin Moomaw  Procedure(s) Performed: TRANSESOPHAGEAL ECHOCARDIOGRAM (TEE) (N/A ) CARDIOVERSION (N/A )     Patient location during evaluation: Endoscopy Anesthesia Type: MAC Level of consciousness: awake and alert, patient cooperative and oriented Pain management: pain level controlled Vital Signs Assessment: post-procedure vital signs reviewed and stable Respiratory status: spontaneous breathing, nonlabored ventilation and respiratory function stable Cardiovascular status: blood pressure returned to baseline and stable Postop Assessment: no apparent nausea or vomiting Anesthetic complications: no    Last Vitals:  Vitals:   01/02/17 0900 01/02/17 0910  BP: (!) 160/82 (!) 154/77  Pulse: 68 68  Resp: (!) 21 19  Temp:    SpO2: 95% 93%    Last Pain:  Vitals:   01/02/17 0847  TempSrc: Oral                 Hessie Varone,E. Alane Hanssen

## 2017-01-02 NOTE — Progress Notes (Signed)
  Echocardiogram Echocardiogram Transesophageal has been performed.  Jannett Celestine 01/02/2017, 11:44 AM

## 2017-01-02 NOTE — Anesthesia Preprocedure Evaluation (Addendum)
Anesthesia Evaluation  Patient identified by MRN, date of birth, ID band Patient awake    Reviewed: Allergy & Precautions, NPO status , Patient's Chart, lab work & pertinent test results, reviewed documented beta blocker date and time   History of Anesthesia Complications Negative for: history of anesthetic complications  Airway Mallampati: II  TM Distance: >3 FB Neck ROM: Full    Dental  (+) Dental Advisory Given, Missing, Chipped   Pulmonary neg pulmonary ROS, former smoker,    breath sounds clear to auscultation       Cardiovascular hypertension, Pt. on medications and Pt. on home beta blockers (-) angina+ dysrhythmias Atrial Fibrillation  Rhythm:Irregular Rate:Normal + Systolic murmurs 6/43 ECHO: EF 60-65%, mild restriction of post leaflet MV   Neuro/Psych Anxiety negative neurological ROS     GI/Hepatic Neg liver ROS, GERD  Medicated and Controlled,  Endo/Other  diabetes, Oral Hypoglycemic Agents  Renal/GU Renal InsufficiencyRenal disease     Musculoskeletal   Abdominal (+) + obese,   Peds  Hematology eliquis   Anesthesia Other Findings   Reproductive/Obstetrics                           Anesthesia Physical Anesthesia Plan  ASA: III  Anesthesia Plan: MAC   Post-op Pain Management:    Induction:   PONV Risk Score and Plan: 1 and Ondansetron  Airway Management Planned: Natural Airway and Nasal Cannula  Additional Equipment:   Intra-op Plan:   Post-operative Plan:   Informed Consent: I have reviewed the patients History and Physical, chart, labs and discussed the procedure including the risks, benefits and alternatives for the proposed anesthesia with the patient or authorized representative who has indicated his/her understanding and acceptance.   Dental advisory given  Plan Discussed with: CRNA and Surgeon  Anesthesia Plan Comments: (Plan routine monitors, MAC)         Anesthesia Quick Evaluation

## 2017-01-02 NOTE — Interval H&P Note (Signed)
History and Physical Interval Note:  01/02/2017 7:13 AM  [name] [name]  has presented today for surgery, with the diagnosis of AFIB  The various methods of treatment have been discussed with the patient and family. After consideration of risks, benefits and other options for treatment, the patient has consented to  Procedure(s): TRANSESOPHAGEAL ECHOCARDIOGRAM (TEE) (N/A) CARDIOVERSION (N/A) as a surgical intervention .  The patient's history has been reviewed, patient examined, no change in status, stable for surgery.  I have reviewed the patient's chart and labs.  Questions were answered to the patient's satisfaction.     Kirk Ruths

## 2017-01-02 NOTE — Transfer of Care (Signed)
Immediate Anesthesia Transfer of Care Note  Patient: [name] [name]  Procedure(s) Performed: TRANSESOPHAGEAL ECHOCARDIOGRAM (TEE) (N/A ) CARDIOVERSION (N/A )  Patient Location: Endo unit  Anesthesia Type:MAC  Level of Consciousness: awake, alert  and oriented  Airway & Oxygen Therapy: Patient Spontanous Breathing  Post-op Assessment: Report given to RN and Post -op Vital signs reviewed and stable  Post vital signs: Reviewed and stable  Last Vitals:  Vitals:   01/02/17 0658 01/02/17 0847  BP: (!) 172/81 (!) 152/78  Pulse: (!) 129 68  Resp: 18 20  Temp: 36.9 C 36.6 C  SpO2: 100% 99%    Last Pain:  Vitals:   01/02/17 0847  TempSrc: Oral         Complications: No apparent anesthesia complications

## 2017-01-02 NOTE — CV Procedure (Signed)
    Transesophageal Echocardiogram Note  Fady Stamps [id number] 05-21-1940  Procedure: Transesophageal Echocardiogram Indications: Atrial fibrillation, MR  Procedure Details Consent: Obtained Time Out: Verified patient identification, verified procedure, site/side was marked, verified correct patient position, special equipment/implants available, Radiology Safety Procedures followed,  medications/allergies/relevent history reviewed, required imaging and test results available.  Performed  Medications:  Pt sedated by anesthesia with lidocaine 100 mg and diprovan 120 mg IV total.  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 Pt subsequently underwent DCCV with 120 J to NSR. No immediate complications; continue apixaban.   Complications: No apparent complications Patient did tolerate procedure well.  Kirk Ruths, MD

## 2017-01-03 ENCOUNTER — Encounter (HOSPITAL_COMMUNITY): Payer: Self-pay | Admitting: Cardiology

## 2017-01-05 ENCOUNTER — Telehealth: Payer: Self-pay | Admitting: Cardiology

## 2017-01-05 NOTE — Telephone Encounter (Signed)
New Message  Pt wife call requesting to speak with RN about pt TEE. Please call back to discuss

## 2017-01-05 NOTE — Telephone Encounter (Signed)
Patient's wife aware of MD recommendation OV Feb 18, 2017

## 2017-01-05 NOTE — Telephone Encounter (Signed)
LMTCB

## 2017-01-05 NOTE — Telephone Encounter (Signed)
Pt should be scheduled for fu ov in Jan [name] [name]

## 2017-01-05 NOTE — Telephone Encounter (Signed)
Spoke with wife (DPR). She reports that after patient's TEE Dr. [name] had a lot of "action items" for patient. She reports patient will need a heart cath and a CT surgery referral. I am unable to locate this info on the TEE report or AVS. Advised wife that I will send message to Hilda Blades, RN & Dr. [name] for further instruction and they will be contacted accordingly. She voiced understanding.

## 2017-01-15 ENCOUNTER — Other Ambulatory Visit: Payer: Self-pay | Admitting: Family Medicine

## 2017-02-09 ENCOUNTER — Telehealth: Payer: Self-pay | Admitting: Cardiology

## 2017-02-09 NOTE — Telephone Encounter (Signed)
New Message   Pt c/o Shortness Of Breath: STAT if SOB developed within the last 24 hours or pt is noticeably SOB on the phone  1. Are you currently SOB (can you hear that pt is SOB on the phone)? no  2. How long have you been experiencing SOB? About a week  3. Are you SOB when sitting or when up moving around? Moving around   4. Are you currently experiencing any other symptoms? Throwing up

## 2017-02-09 NOTE — Progress Notes (Signed)
Cardiology Office Note   Date:  02/10/2017   ID:  [name], [name] [birth year], MRN [id number]  PCP:  Martinique, Betty G, MD  Cardiologist: Kirk Ruths, MD   Chief Complaint  Patient presents with  . Shortness of Breath    pt states some SOB   . Follow-up    states having horrible back pains and vomiting and some gagging in the mornings   . Atrial Fibrillation     History of Present Illness: [name] [name] is a 77 y.o. 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  Patient was continued on anticoagulation with Eliquis 5 mg twice daily.CHADS VASC Score of 3.  On last office visit on 12/15/2016 the patient was symptomatic concerning his atrial fibrillation and he was planned for a TEE cardioversion.  TEE Cardioversion was performed on 01/02/2017 with a 120 J x1 converting to normal sinus rhythm.  Left ventricle: Systolic function was normal. The estimated   ejection fraction was in the range of 60% to 65%. Wall motion was   normal; there were no regional wall motion abnormalities. No   evidence of thrombus. - Aortic valve: No evidence of vegetation. There was trivial   regurgitation. - Mitral valve: Mobility of the posterior leaflet was moderately   restricted. Mild prolapse, involving the anterior leaflet. There   was severe regurgitation. - Left atrium: The atrium was moderately dilated. No evidence of   thrombus in the atrial cavity or appendage. No evidence of   thrombus in the atrial cavity or appendage. - Right atrium: No evidence of thrombus in the atrial cavity or   appendage. - Atrial septum: There was a patent foramen ovale. There was an   atrial septal aneurysm. - Tricuspid valve: No evidence of vegetation. -  Pulmonic valve: No evidence of vegetation.  Patient's wife called our office with several concerns and also inquiring about patient having cardiac catheterization, and possible heart surgery.  This was on 02/02/2017.  Patient was advised to keep appointment on follow-up as directed.  He comes today with his wife complaining of worsening dyspnea on exertion, chest discomfort substernal, lower back pain which is chronic for him.  The patient and his wife stated that they did have a conversation with Dr. [name] stating that he would need a cardiac catheterization if symptoms worsened.  (I do not see documentation of this), however both he and his wife are adamant that that conversation took place.  They are concerned about his mitral valve is well and timing of need to see cardiovascular surgeon.  Past Medical History:  Diagnosis Date  . Allergy   . Anxiety   . Diabetes mellitus without complication ([place])   . GERD (gastroesophageal reflux disease)   . Glaucoma   . High cholesterol   . Hypertension   . Renal insufficiency   . Seasonal allergies   . TIA (transient ischemic attack)     Past Surgical History:  Procedure Laterality Date  . CARDIOVERSION N/A 01/02/2017   Procedure: CARDIOVERSION;  Surgeon: Lelon Perla, MD;  Location: [facility] ENDOSCOPY;  Service: Cardiovascular;  Laterality: N/A;  . KNEE SURGERY    . TEE WITHOUT CARDIOVERSION N/A 01/02/2017   Procedure: TRANSESOPHAGEAL ECHOCARDIOGRAM (TEE);  Surgeon: Lelon Perla, MD;  Location: [place];  Service: Cardiovascular;  Laterality: N/A;  . TONSILLECTOMY AND ADENOIDECTOMY  1943     Current Outpatient Medications  Medication Sig Dispense Refill  . ALPRAZolam (XANAX) 0.25 MG tablet Take 0.25 mg by mouth 2 (two) times daily as needed for anxiety.    Marland Kitchen apixaban (ELIQUIS) 5 MG TABS tablet Take 1 tablet (5 mg total) 2 (two) times daily by mouth. 60 tablet 6  . atorvastatin (LIPITOR) 80 MG tablet Take 80 mg by mouth daily.      . Coenzyme Q10 (CO Q 10) 100 MG CAPS Take 1 capsule by mouth daily.    . furosemide (LASIX) 40 MG tablet Take 0.5 tablets (20 mg total) by mouth daily. (Patient taking differently: Take 20 mg by mouth daily as needed for edema. ) 40 tablet 2  . irbesartan (AVAPRO) 300 MG tablet Take 300 mg by mouth daily.    . metFORMIN (GLUCOPHAGE) 500 MG tablet Take 500 mg by mouth 2 (two) times daily with a meal.    . metoprolol (TOPROL-XL) 200 MG 24 hr tablet Take 200 mg by mouth daily.    . sertraline (ZOLOFT) 50 MG tablet Take 50 mg by mouth daily.     No current facility-administered medications for this visit.     Allergies:   Penicillins; Shellfish allergy; and Fenofibrate    Social History:  The patient  reports that he has quit smoking. he has never used smokeless tobacco. He reports that he drinks alcohol. He reports that he does not use drugs.   Family History:  The patient's family history includes Alzheimer's disease in his mother; COPD in his father; Cancer in his sister.    ROS: All other systems are reviewed and negative. Unless otherwise mentioned in H&P    PHYSICAL EXAM: VS:  BP (!) 178/90   Pulse 64   Ht 5\' 5"  (1.651 m)   Wt 186 lb 9.6 oz (84.6 kg)   BMI 31.05 kg/m  , BMI Body mass index is 31.05 kg/m. GEN: Well nourished, well developed, in no acute distress Obese HEENT: normal  Neck: no JVD, carotid bruits, or masses Cardiac: IRRR; 1/2 systolic murmurs, rubs, or gallops,no edema  Respiratory:  clear to auscultation bilaterally, normal work of breathing GI: soft, nontender, nondistended, + BS MS: no deformity or atrophy  Skin: warm and dry, no rash Neuro:  Strength and sensation are intact Psych: euthymic mood, full affect   EKG: Atrial fibrillation, heart rate of 64 bpm.  No acute ST-T wave abnormalities.   Recent Labs: 10/24/2016: Platelets 235.0; Pro B Natriuretic peptide (BNP) 1,313.0 12/15/2016: BUN 19; Creatinine, Ser 1.50 01/02/2017: Hemoglobin 10.5;  Potassium 4.2; Sodium 140    Lipid Panel No results found for: CHOL, TRIG, HDL, CHOLHDL, VLDL, LDLCALC, LDLDIRECT    Wt Readings from Last 3 Encounters:  02/10/17 186 lb 9.6 oz (84.6 kg)  12/15/16 185 lb (83.9 kg)  10/27/16 184 lb (83.5 kg)      Other studies Reviewed: TEE as above  ASSESSMENT AND PLAN:  1.  Chest pain and dyspnea on exertion: The patient has spoken with Dr. [name] and has had discussion of need to have cardiac catheterization for definitive evaluation of coronary anatomy and to have further review of mitral valve disease.  TEE is noted above.  The patient will be scheduled for both the right and left heart cath in the setting of worsening dyspnea and chest discomfort.  I have explained this to the patient and his wife who are eager to have this  completed.  The patient understands that risks include but are not limited to stroke (1 in 1000), death (1 in 46), kidney failure [usually temporary] (1 in 500), bleeding (1 in 200), allergic reaction [possibly serious] (1 in 200), and agrees to proceed.  Noted, the patient does have a shellfish allergy and will be treated appropriately.  He will hold his Eliquis for 24 hours prior to procedure, but this is been explained to him and he verbalizes understanding.  This will be also placed in his AVS instructions.  They will keep follow-up appointment with Dr. [name] next week as previously scheduled to discuss further plans based upon catheterization results and need to proceed with referral to cardiovascular surgeon.  2.  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  This may be contributing to his overall breathing status and chest pressure.  Discussion will be had with patient by Dr. [name] post catheterization for further plans and referrals at his discretion.  3.  Hypertension: Blood pressure is not well controlled currently.  The patient does have whitecoat syndrome and states his blood pressure is always  elevated when seen in a physician's office.  At home he states is running 182X and 937J systolic.  I will start him on amlodipine 2.5 mg daily.   4.  Atrial fibrillation: The patient had cardioversion but has now reverted back to atrial fibrillation heart rate is well controlled he remains on Eliquis and metoprolol for heart rate control.  The patient's Eliquis will be held prior to catheterization.  Current medicines are reviewed at length with the patient today.    Labs/ tests ordered today include: Cardiac catheterization was precath labs.  The patient does have a shellfish allergy and will receive prophylaxis.  Phill Myron. West Pugh, ANP, AACC   02/10/2017 9:38 AM    [facility] [address], [place], [place] [address] Phone: [phone]; Fax: [phone]

## 2017-02-09 NOTE — Telephone Encounter (Signed)
Spoke with patients wife regarding patient. He had TEE/Cardioversion 01/02/17 and since about a week after he has not been feeling well. Per wife patient went back into Afib and has had increased shortness of breath. He is feeling weak and not eating well. She gave him Ensure yesterday and he had vomiting. Does have lower extremity edema but there has been no change in that. Did schedule appointment tomorrow with DNP Jory Sims. Advised to go to ED if worse, verbalized understanding.

## 2017-02-09 NOTE — H&P (View-Only) (Signed)
Cardiology Office Note   Date:  02/10/2017   ID:  Davidson, Palmieri February 14, 1940, MRN [id number]  PCP:  Martinique, Betty G, MD  Cardiologist: Kirk Ruths, MD   Chief Complaint  Patient presents with  . Shortness of Breath    pt states some SOB   . Follow-up    states having horrible back pains and vomiting and some gagging in the mornings   . Atrial Fibrillation     History of Present Illness: [name] [name] is a 77 y.o. 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  Patient was continued on anticoagulation with Eliquis 5 mg twice daily.CHADS VASC Score of 3.  On last office visit on 12/15/2016 the patient was symptomatic concerning his atrial fibrillation and he was planned for a TEE cardioversion.  TEE Cardioversion was performed on 01/02/2017 with a 120 J x1 converting to normal sinus rhythm.  Left ventricle: Systolic function was normal. The estimated   ejection fraction was in the range of 60% to 65%. Wall motion was   normal; there were no regional wall motion abnormalities. No   evidence of thrombus. - Aortic valve: No evidence of vegetation. There was trivial   regurgitation. - Mitral valve: Mobility of the posterior leaflet was moderately   restricted. Mild prolapse, involving the anterior leaflet. There   was severe regurgitation. - Left atrium: The atrium was moderately dilated. No evidence of   thrombus in the atrial cavity or appendage. No evidence of   thrombus in the atrial cavity or appendage. - Right atrium: No evidence of thrombus in the atrial cavity or   appendage. - Atrial septum: There was a patent foramen ovale. There was an   atrial septal aneurysm. - Tricuspid valve: No evidence of vegetation. -  Pulmonic valve: No evidence of vegetation.  Patient's wife called our office with several concerns and also inquiring about patient having cardiac catheterization, and possible heart surgery.  This was on 02/02/2017.  Patient was advised to keep appointment on follow-up as directed.  He comes today with his wife complaining of worsening dyspnea on exertion, chest discomfort substernal, lower back pain which is chronic for him.  The patient and his wife stated that they did have a conversation with Dr. [name] stating that he would need a cardiac catheterization if symptoms worsened.  (I do not see documentation of this), however both he and his wife are adamant that that conversation took place.  They are concerned about his mitral valve is well and timing of need to see cardiovascular surgeon.  Past Medical History:  Diagnosis Date  . Allergy   . Anxiety   . Diabetes mellitus without complication ([place])   . GERD (gastroesophageal reflux disease)   . Glaucoma   . High cholesterol   . Hypertension   . Renal insufficiency   . Seasonal allergies   . TIA (transient ischemic attack)     Past Surgical History:  Procedure Laterality Date  . CARDIOVERSION N/A 01/02/2017   Procedure: CARDIOVERSION;  Surgeon: Lelon Perla, MD;  Location: [facility] ENDOSCOPY;  Service: Cardiovascular;  Laterality: N/A;  . KNEE SURGERY    . TEE WITHOUT CARDIOVERSION N/A 01/02/2017   Procedure: TRANSESOPHAGEAL ECHOCARDIOGRAM (TEE);  Surgeon: Lelon Perla, MD;  Location: [place];  Service: Cardiovascular;  Laterality: N/A;  . TONSILLECTOMY AND ADENOIDECTOMY  1943     Current Outpatient Medications  Medication Sig Dispense Refill  . ALPRAZolam (XANAX) 0.25 MG tablet Take 0.25 mg by mouth 2 (two) times daily as needed for anxiety.    Marland Kitchen apixaban (ELIQUIS) 5 MG TABS tablet Take 1 tablet (5 mg total) 2 (two) times daily by mouth. 60 tablet 6  . atorvastatin (LIPITOR) 80 MG tablet Take 80 mg by mouth daily.      . Coenzyme Q10 (CO Q 10) 100 MG CAPS Take 1 capsule by mouth daily.    . furosemide (LASIX) 40 MG tablet Take 0.5 tablets (20 mg total) by mouth daily. (Patient taking differently: Take 20 mg by mouth daily as needed for edema. ) 40 tablet 2  . irbesartan (AVAPRO) 300 MG tablet Take 300 mg by mouth daily.    . metFORMIN (GLUCOPHAGE) 500 MG tablet Take 500 mg by mouth 2 (two) times daily with a meal.    . metoprolol (TOPROL-XL) 200 MG 24 hr tablet Take 200 mg by mouth daily.    . sertraline (ZOLOFT) 50 MG tablet Take 50 mg by mouth daily.     No current facility-administered medications for this visit.     Allergies:   Penicillins; Shellfish allergy; and Fenofibrate    Social History:  The patient  reports that he has quit smoking. he has never used smokeless tobacco. He reports that he drinks alcohol. He reports that he does not use drugs.   Family History:  The patient's family history includes Alzheimer's disease in his mother; COPD in his father; Cancer in his sister.    ROS: All other systems are reviewed and negative. Unless otherwise mentioned in H&P    PHYSICAL EXAM: VS:  BP (!) 178/90   Pulse 64   Ht 5\' 5"  (1.651 m)   Wt 186 lb 9.6 oz (84.6 kg)   BMI 31.05 kg/m  , BMI Body mass index is 31.05 kg/m. GEN: Well nourished, well developed, in no acute distress Obese HEENT: normal  Neck: no JVD, carotid bruits, or masses Cardiac: IRRR; 1/2 systolic murmurs, rubs, or gallops,no edema  Respiratory:  clear to auscultation bilaterally, normal work of breathing GI: soft, nontender, nondistended, + BS MS: no deformity or atrophy  Skin: warm and dry, no rash Neuro:  Strength and sensation are intact Psych: euthymic mood, full affect   EKG: Atrial fibrillation, heart rate of 64 bpm.  No acute ST-T wave abnormalities.   Recent Labs: 10/24/2016: Platelets 235.0; Pro B Natriuretic peptide (BNP) 1,313.0 12/15/2016: BUN 19; Creatinine, Ser 1.50 01/02/2017: Hemoglobin 10.5;  Potassium 4.2; Sodium 140    Lipid Panel No results found for: CHOL, TRIG, HDL, CHOLHDL, VLDL, LDLCALC, LDLDIRECT    Wt Readings from Last 3 Encounters:  02/10/17 186 lb 9.6 oz (84.6 kg)  12/15/16 185 lb (83.9 kg)  10/27/16 184 lb (83.5 kg)      Other studies Reviewed: TEE as above  ASSESSMENT AND PLAN:  1.  Chest pain and dyspnea on exertion: The patient has spoken with Dr. [name] and has had discussion of need to have cardiac catheterization for definitive evaluation of coronary anatomy and to have further review of mitral valve disease.  TEE is noted above.  The patient will be scheduled for both the right and left heart cath in the setting of worsening dyspnea and chest discomfort.  I have explained this to the patient and his wife who are eager to have this  completed.  The patient understands that risks include but are not limited to stroke (1 in 1000), death (1 in 54), kidney failure [usually temporary] (1 in 500), bleeding (1 in 200), allergic reaction [possibly serious] (1 in 200), and agrees to proceed.  Noted, the patient does have a shellfish allergy and will be treated appropriately.  He will hold his Eliquis for 24 hours prior to procedure, but this is been explained to him and he verbalizes understanding.  This will be also placed in his AVS instructions.  They will keep follow-up appointment with Dr. [name] next week as previously scheduled to discuss further plans based upon catheterization results and need to proceed with referral to cardiovascular surgeon.  2.  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  This may be contributing to his overall breathing status and chest pressure.  Discussion will be had with patient by Dr. [name] post catheterization for further plans and referrals at his discretion.  3.  Hypertension: Blood pressure is not well controlled currently.  The patient does have whitecoat syndrome and states his blood pressure is always  elevated when seen in a physician's office.  At home he states is running 751Z and 001V systolic.  I will start him on amlodipine 2.5 mg daily.   4.  Atrial fibrillation: The patient had cardioversion but has now reverted back to atrial fibrillation heart rate is well controlled he remains on Eliquis and metoprolol for heart rate control.  The patient's Eliquis will be held prior to catheterization.  Current medicines are reviewed at length with the patient today.    Labs/ tests ordered today include: Cardiac catheterization was precath labs.  The patient does have a shellfish allergy and will receive prophylaxis.  Phill Myron. West Pugh, ANP, AACC   02/10/2017 9:38 AM    [facility] [address] [place],  [address] Phone: [phone]; Fax: [phone]

## 2017-02-09 NOTE — Telephone Encounter (Signed)
Agree with plan; can also add on for me next week Kirk Ruths

## 2017-02-10 ENCOUNTER — Other Ambulatory Visit: Payer: Self-pay | Admitting: Adult Health

## 2017-02-10 ENCOUNTER — Ambulatory Visit: Payer: Medicare Other | Admitting: Adult Health

## 2017-02-10 ENCOUNTER — Encounter: Payer: Self-pay | Admitting: Adult Health

## 2017-02-10 VITALS — BP 178/90 | HR 64 | Ht 65.0 in | Wt 186.6 lb

## 2017-02-10 DIAGNOSIS — I059 Rheumatic mitral valve disease, unspecified: Secondary | ICD-10-CM | POA: Diagnosis not present

## 2017-02-10 DIAGNOSIS — I481 Persistent atrial fibrillation: Secondary | ICD-10-CM

## 2017-02-10 DIAGNOSIS — R0609 Other forms of dyspnea: Secondary | ICD-10-CM | POA: Diagnosis not present

## 2017-02-10 DIAGNOSIS — I10 Essential (primary) hypertension: Secondary | ICD-10-CM

## 2017-02-10 DIAGNOSIS — I4819 Other persistent atrial fibrillation: Secondary | ICD-10-CM

## 2017-02-10 LAB — PROTIME-INR
INR: 1.1 (ref 0.8–1.2)
Prothrombin Time: 11.4 s (ref 9.1–12.0)

## 2017-02-10 LAB — BASIC METABOLIC PANEL
BUN/Creatinine Ratio: 13 (ref 10–24)
BUN: 15 mg/dL (ref 8–27)
CO2: 23 mmol/L (ref 20–29)
Calcium: 9.3 mg/dL (ref 8.6–10.2)
Chloride: 99 mmol/L (ref 96–106)
Creatinine, Ser: 1.18 mg/dL (ref 0.76–1.27)
GFR calc Af Amer: 69 mL/min/{1.73_m2} (ref >59)
GFR, EST NON AFRICAN AMERICAN: 60 mL/min/{1.73_m2} (ref >59)
Glucose: 116 mg/dL — ABNORMAL HIGH (ref 65–99)
POTASSIUM: 4.6 mmol/L (ref 3.5–5.2)
SODIUM: 137 mmol/L (ref 134–144)

## 2017-02-10 LAB — CBC
Hematocrit: 36.6 % — ABNORMAL LOW (ref 37.5–51.0)
Hemoglobin: 12.3 g/dL — ABNORMAL LOW (ref 13.0–17.7)
MCH: 34.2 pg — AB (ref 26.6–33.0)
MCHC: 33.6 g/dL (ref 31.5–35.7)
MCV: 102 fL — ABNORMAL HIGH (ref 79–97)
PLATELETS: 221 10*3/uL (ref 150–379)
RBC: 3.6 x10E6/uL — AB (ref 4.14–5.80)
RDW: 14.3 % (ref 12.3–15.4)
WBC: 7.9 10*3/uL (ref 3.4–10.8)

## 2017-02-10 LAB — TSH: TSH: 1.91 u[IU]/mL (ref 0.450–4.500)

## 2017-02-10 MED ORDER — AMLODIPINE BESYLATE 2.5 MG PO TABS: 2.5000 mg | ORAL_TABLET | Freq: Every day | ORAL | 3 refills | Status: DC

## 2017-02-10 NOTE — Addendum Note (Signed)
Addended by: Waylan Rocher on: 02/10/2017 10:49 AM   Modules accepted: Orders

## 2017-02-10 NOTE — Patient Instructions (Addendum)
Medication Instructions:  START AMLODIPINE 2.5MG  DAILY  If you need a refill on your cardiac medications before your next appointment, please call your pharmacy.  Labwork: BMET, CBC, PT/INR AND TSH TODAY HERE IN OUR OFFICE AT [facility]  Take the provided lab slips for you to take with you to the lab for you blood draw.   Testing/Procedures: Your physician has requested that you have a cardiac catheterization. Cardiac catheterization is used to diagnose and/or treat various heart conditions. Doctors may recommend this procedure for a number of different reasons. The most common reason is to evaluate chest pain. Chest pain can be a symptom of coronary artery disease (CAD), and cardiac catheterization can show whether plaque is narrowing or blocking your heart's arteries. This procedure is also used to evaluate the valves, as well as measure the blood flow and oxygen levels in different parts of your heart. For further information please visit [email or web address]. Please follow instruction sheet, as given.  Follow-Up: Your physician wants you to follow-up in: [place].   Thank you for choosing [facility] at [facility]!!      [place] [address] [place] [address] Dept: [phone] Loc: [place]  02/10/2017  You are scheduled for a Cardiac Catheterization on Friday, January 11 with Dr. Peter Martinique.  1. Please arrive at the [facility] (Main Entrance A) at [facility]: [address] [place], [place] [address] at 5:30 AM (two hours before your procedure to ensure your preparation). Free valet parking service is available.   Special note: Every effort is made to have your procedure done on time. Please understand that emergencies sometimes delay scheduled procedures.  2. Diet: Do not eat or drink anything after  midnight prior to your procedure except sips of water to take medications.  3. Labs: You will need to have blood drawn on Tuesday, January 8 at [facility]  Open: [place] (Lunch 12:30 - 1:30)   Phone: [phone]. You do not need to be fasting.  4. Medication instructions in preparation for your procedure: Stop taking Eliquis (Apixiban) on Wednesday, January 9.  Stop taking, Glucophage (Metformin) Thursday January 10, evening dose . And hold 48 hours after CATH.    Stop taking Furosemide (Lasix) on Friday January 11.  On the morning of your procedure, take your morning medicines NOT listed above.  You may use sips of water.  5. Plan for one night stay--bring personal belongings. 6. Bring a current list of your medications and current insurance cards. 7. You MUST have a responsible person to drive you home. 8. Someone MUST be with you the first 24 hours after you arrive home or your discharge will be delayed. 9. Please wear clothes that are easy to get on and off and wear slip-on shoes.  Thank you for allowing [place] to care for you!   -- [facility]

## 2017-02-11 NOTE — Progress Notes (Signed)
HPI: FU atrial fibrillation and MR. Echocardiogram September 2018 showed normal LV function, restricted posterior mitral valve leaflet with probable mild mitral regurgitation (though difficult to quantitate due to eccentric nature of jet) and moderate left atrial enlargement. Abdominal ultrasound October 2018 showed no aneurysm.  On November 30 patient had transesophageal echocardiogram that showed normal LV function, trace aortic insufficiency, restricted posterior mitral valve leaflet with prolapse of anterior leaflet and severe mitral regurgitation, moderate left atrial enlargement and a PFO with atrial septal aneurysm.  Patient underwent successful cardioversion at that time.  Plan was to continue anticoagulation for 4 weeks.  He would then undergo cardiac catheterization and refer for mitral valve repair or replacement.  Patient apparently had recurrent atrial fibrillation 1 week after procedure and had worsening dyspnea.  He was seen in the office on January 8 and cardiac catheterization was arranged in anticipation of referral for mitral valve surgery and Maze procedure. Cardiac catheterization January 2019 showed normal LV function, no obstructive coronary disease and severe mitral regurgitation. PA pressure 50/22. Since last seen, he does have dyspnea on exertion. No orthopnea, PND, or chest pain. No palpitations or syncope.  Current Outpatient Medications  Medication Sig Dispense Refill  . ALPRAZolam (XANAX) 0.25 MG tablet Take 0.25 mg by mouth 2 (two) times daily as needed for anxiety.    Marland Kitchen amLODipine (NORVASC) 2.5 MG tablet Take 1 tablet (2.5 mg total) by mouth daily. 30 tablet 3  . apixaban (ELIQUIS) 5 MG TABS tablet Take 1 tablet (5 mg total) by mouth 2 (two) times daily. 60 tablet 6  . atorvastatin (LIPITOR) 80 MG tablet Take 80 mg by mouth daily.     . Bilberry, Vaccinium myrtillus, (BILBERRY PO) Take 1 capsule by mouth daily.    . Coenzyme Q10 (CO Q 10) 100 MG CAPS Take 1 capsule  by mouth daily.    . furosemide (LASIX) 40 MG tablet Take 0.5 tablets (20 mg total) by mouth daily. (Patient taking differently: Take 20 mg by mouth daily as needed for edema. ) 40 tablet 2  . irbesartan (AVAPRO) 300 MG tablet Take 300 mg by mouth daily.    Javier Docker Oil 500 MG CAPS Take 1 capsule by mouth daily.    Marland Kitchen L-Arginine 500 MG TABS Take 1 tablet by mouth daily.    . metFORMIN (GLUCOPHAGE) 500 MG tablet Take 1 tablet (500 mg total) by mouth 2 (two) times daily with a meal.    . metoprolol (TOPROL-XL) 200 MG 24 hr tablet Take 200 mg by mouth daily.    . Multiple Vitamins-Minerals (MULTIVITAMIN WITH MINERALS) tablet Take 1 tablet by mouth daily.    . sertraline (ZOLOFT) 50 MG tablet Take 50 mg by mouth daily.     No current facility-administered medications for this visit.      Past Medical History:  Diagnosis Date  . Allergy   . Anxiety   . Diabetes mellitus without complication ([place])   . GERD (gastroesophageal reflux disease)   . Glaucoma   . High cholesterol   . Hypertension   . Renal insufficiency   . Seasonal allergies   . TIA (transient ischemic attack)     Past Surgical History:  Procedure Laterality Date  . CARDIOVERSION N/A 01/02/2017   Procedure: CARDIOVERSION;  Surgeon: Lelon Perla, MD;  Location: [facility] ENDOSCOPY;  Service: Cardiovascular;  Laterality: N/A;  . KNEE SURGERY    . RIGHT/LEFT HEART CATH AND CORONARY ANGIOGRAPHY N/A 02/13/2017   Procedure:  RIGHT/LEFT HEART CATH AND CORONARY ANGIOGRAPHY;  Surgeon: Martinique, Peter M, MD;  Location: [place] CV LAB;  Service: Cardiovascular;  Laterality: N/A;  . TEE WITHOUT CARDIOVERSION N/A 01/02/2017   Procedure: TRANSESOPHAGEAL ECHOCARDIOGRAM (TEE);  Surgeon: Lelon Perla, MD;  Location: [facility] ENDOSCOPY;  Service: Cardiovascular;  Laterality: N/A;  . [place]    Social History   Socioeconomic History  . Marital status: Married    Spouse name: Not on file  . Number of children: 2   . Years of education: Not on file  . Highest education level: Not on file  Social Needs  . Financial resource strain: Not on file  . Food insecurity - worry: Not on file  . Food insecurity - inability: Not on file  . Transportation needs - medical: Not on file  . Transportation needs - non-medical: Not on file  Occupational History  . Not on file  Tobacco Use  . Smoking status: Former Research scientist (life sciences)  . Smokeless tobacco: Never Used  Substance and Sexual Activity  . Alcohol use: Yes    Comment: a few beers a day  . Drug use: No  . Sexual activity: Not on file  Other Topics Concern  . Not on file  Social History Narrative  . Not on file    Family History  Problem Relation Age of Onset  . Alzheimer's disease Mother   . COPD Father   . Cancer Sister        breast    ROS: no fevers or chills, productive cough, hemoptysis, dysphasia, odynophagia, melena, hematochezia, dysuria, hematuria, rash, seizure activity, orthopnea, PND, claudication. Remaining systems are negative.  Physical Exam: Well-developed well-nourished in no acute distress.  Skin is warm and dry.  HEENT is normal.  Neck is supple.  Chest is clear to auscultation with normal expansion.  Cardiovascular exam is irregular, 3/6 systolic murmur apex. Abdominal exam nontender or distended. No masses palpated. Extremities show 1+ edema. neuro grossly intact  ECG-atrial fibrillation at a rate of 63. No ST changes. personally reviewed  A/P  1 severe mitral regurgitation-patient's mitral regurgitation was noted to be severe on recent transesophageal echocardiogram. Cardiac catheterization revealed no obstructive coronary disease. Mitral regurgitation likely contributed to left atrial enlargement/atrial fibrillation. He is also symptomatic with increased dyspnea on exertion. I will refer to Dr. Roxy Manns for consideration of mitral valve repair and maze procedure.  2 persistent atrial fibrillation-patient developed recurrent  atrial fibrillation following cardioversion. He will need Maze procedure at time of valve surgery. I will begin amiodarone 200 mg twice a day for 2 weeks and then decrease to 200 mg daily thereafter. He is more likely to hold sinus rhythm following Maze procedure with short term amiodarone. Continue apixaban. Given that we are adding amiodarone I will decrease Toprol to 150 mg daily. Follow heart rate and adjust regimen as needed.  3 hypertension-blood pressure is elevated. Increase amlodipine to 5 mg daily. He has mild lower extremity edema. I have asked him to take Lasix 20 mg every other day as he is only taking as needed at present. Follow blood pressure and adjust regimen.  4 hyperlipidemia-continue statin.  5 chronic diastolic congestive heart failure-likely predominantly secondary to mitral regurgitation. Mildly volume overloaded on exam. I've asked him to take Lasix 20 mg every other day and we will follow.  6 PFO-will likely need closure at time of surgery.  Kirk Ruths, MD

## 2017-02-12 ENCOUNTER — Telehealth: Payer: Self-pay

## 2017-02-12 NOTE — Telephone Encounter (Signed)
Patient contacted pre-catheterization at [facility] scheduled for:   02/13/2017 @ 0730 Verified arrival time and place:  [facility] @ 0530 Confirmed AM meds to be taken pre-cath with sip of water: Take ASA Hold Eliquis-last dose 1/8? Metformin-Do not take tonight's dose Lasix-hold am of  Confirmed patient has responsible person to drive home post procedure and observe patient for 24 hours:  yes Addl concerns:  none

## 2017-02-13 ENCOUNTER — Ambulatory Visit (HOSPITAL_COMMUNITY)
Admission: RE | Admit: 2017-02-13 | Discharge: 2017-02-13 | Disposition: A | Discharge status: Home / Self Care | Payer: Medicare Other | Source: Ambulatory Visit | Attending: Cardiology | Admitting: Cardiology

## 2017-02-13 ENCOUNTER — Encounter (HOSPITAL_COMMUNITY)
Admission: RE | Disposition: A | Discharge status: Home / Self Care | Payer: Self-pay | Source: Ambulatory Visit | Attending: Cardiology

## 2017-02-13 ENCOUNTER — Encounter (HOSPITAL_COMMUNITY): Payer: Self-pay | Admitting: Cardiology

## 2017-02-13 DIAGNOSIS — I272 Pulmonary hypertension, unspecified: Secondary | ICD-10-CM | POA: Diagnosis not present

## 2017-02-13 DIAGNOSIS — F419 Anxiety disorder, unspecified: Secondary | ICD-10-CM | POA: Diagnosis not present

## 2017-02-13 DIAGNOSIS — E119 Type 2 diabetes mellitus without complications: Secondary | ICD-10-CM | POA: Diagnosis not present

## 2017-02-13 DIAGNOSIS — E1169 Type 2 diabetes mellitus with other specified complication: Secondary | ICD-10-CM | POA: Diagnosis present

## 2017-02-13 DIAGNOSIS — Z7901 Long term (current) use of anticoagulants: Secondary | ICD-10-CM | POA: Diagnosis not present

## 2017-02-13 DIAGNOSIS — I10 Essential (primary) hypertension: Secondary | ICD-10-CM | POA: Insufficient documentation

## 2017-02-13 DIAGNOSIS — R079 Chest pain, unspecified: Secondary | ICD-10-CM | POA: Insufficient documentation

## 2017-02-13 DIAGNOSIS — Z8673 Personal history of transient ischemic attack (TIA), and cerebral infarction without residual deficits: Secondary | ICD-10-CM | POA: Insufficient documentation

## 2017-02-13 DIAGNOSIS — Z79899 Other long term (current) drug therapy: Secondary | ICD-10-CM | POA: Diagnosis not present

## 2017-02-13 DIAGNOSIS — I4891 Unspecified atrial fibrillation: Secondary | ICD-10-CM | POA: Insufficient documentation

## 2017-02-13 DIAGNOSIS — Z87891 Personal history of nicotine dependence: Secondary | ICD-10-CM | POA: Insufficient documentation

## 2017-02-13 DIAGNOSIS — E785 Hyperlipidemia, unspecified: Secondary | ICD-10-CM

## 2017-02-13 DIAGNOSIS — I340 Nonrheumatic mitral (valve) insufficiency: Secondary | ICD-10-CM | POA: Diagnosis present

## 2017-02-13 DIAGNOSIS — Z7984 Long term (current) use of oral hypoglycemic drugs: Secondary | ICD-10-CM | POA: Insufficient documentation

## 2017-02-13 DIAGNOSIS — E1121 Type 2 diabetes mellitus with diabetic nephropathy: Secondary | ICD-10-CM

## 2017-02-13 DIAGNOSIS — E7800 Pure hypercholesterolemia, unspecified: Secondary | ICD-10-CM | POA: Diagnosis not present

## 2017-02-13 DIAGNOSIS — I2510 Atherosclerotic heart disease of native coronary artery without angina pectoris: Secondary | ICD-10-CM | POA: Diagnosis not present

## 2017-02-13 HISTORY — PX: RIGHT/LEFT HEART CATH AND CORONARY ANGIOGRAPHY: CATH118266

## 2017-02-13 LAB — POCT I-STAT 3, VENOUS BLOOD GAS (G3P V)
ACID-BASE DEFICIT: 3 mmol/L — AB (ref 0.0–2.0)
BICARBONATE: 23.2 mmol/L (ref 20.0–28.0)
O2 Saturation: 65 %
PH VEN: 7.321 (ref 7.250–7.430)
TCO2: 25 mmol/L (ref 22–32)
pCO2, Ven: 44.9 mmHg (ref 44.0–60.0)
pO2, Ven: 36 mmHg (ref 32.0–45.0)

## 2017-02-13 LAB — POCT I-STAT 3, ART BLOOD GAS (G3+)
Acid-base deficit: 3 mmol/L — ABNORMAL HIGH (ref 0.0–2.0)
Bicarbonate: 22.8 mmol/L (ref 20.0–28.0)
O2 Saturation: 96 %
PCO2 ART: 40.6 mmHg (ref 32.0–48.0)
PO2 ART: 84 mmHg (ref 83.0–108.0)
TCO2: 24 mmol/L (ref 22–32)
pH, Arterial: 7.358 (ref 7.350–7.450)

## 2017-02-13 LAB — GLUCOSE, CAPILLARY: GLUCOSE-CAPILLARY: 111 mg/dL — AB (ref 65–99)

## 2017-02-13 SURGERY — RIGHT/LEFT HEART CATH AND CORONARY ANGIOGRAPHY
Anesthesia: LOCAL

## 2017-02-13 MED ORDER — APIXABAN 5 MG PO TABS: 5.0000 mg | ORAL_TABLET | Freq: Two times a day (BID) | ORAL | 6 refills | Status: DC

## 2017-02-13 MED ORDER — FENTANYL CITRATE (PF) 100 MCG/2ML IJ SOLN
INTRAMUSCULAR | Status: AC
  Filled 2017-02-13: qty 2

## 2017-02-13 MED ORDER — VERAPAMIL HCL 2.5 MG/ML IV SOLN
INTRAVENOUS | Status: AC
  Filled 2017-02-13: qty 2

## 2017-02-13 MED ORDER — SODIUM CHLORIDE 0.9% FLUSH: 3.0000 mL | Freq: Two times a day (BID) | INTRAVENOUS | Status: DC

## 2017-02-13 MED ORDER — LIDOCAINE HCL (PF) 1 % IJ SOLN
INTRAMUSCULAR | Status: DC | PRN
  Administered 2017-02-13 (×2): 2 mL

## 2017-02-13 MED ORDER — SODIUM CHLORIDE 0.9 % IV SOLN: 250.0000 mL | INTRAVENOUS | Status: DC | PRN

## 2017-02-13 MED ORDER — SODIUM CHLORIDE 0.9% FLUSH
3.0000 mL | INTRAVENOUS | Status: DC | PRN
Start: 2017-02-13 — End: 2017-02-13

## 2017-02-13 MED ORDER — HEPARIN (PORCINE) IN NACL 2-0.9 UNIT/ML-% IJ SOLN
INTRAMUSCULAR | Status: AC
  Filled 2017-02-13: qty 1000

## 2017-02-13 MED ORDER — METFORMIN HCL 500 MG PO TABS: 500.0000 mg | ORAL_TABLET | Freq: Two times a day (BID) | ORAL | Status: DC

## 2017-02-13 MED ORDER — SODIUM CHLORIDE 0.9 % WEIGHT BASED INFUSION: 1.0000 mL/kg/h | INTRAVENOUS | Status: AC

## 2017-02-13 MED ORDER — IOPAMIDOL (ISOVUE-370) INJECTION 76%
INTRAVENOUS | Status: DC | PRN
  Administered 2017-02-13: 60 mL via INTRA_ARTERIAL

## 2017-02-13 MED ORDER — LIDOCAINE HCL (PF) 1 % IJ SOLN
INTRAMUSCULAR | Status: AC
  Filled 2017-02-13: qty 30

## 2017-02-13 MED ORDER — IOPAMIDOL (ISOVUE-370) INJECTION 76%
INTRAVENOUS | Status: AC
  Filled 2017-02-13: qty 100

## 2017-02-13 MED ORDER — VERAPAMIL HCL 2.5 MG/ML IV SOLN
INTRAVENOUS | Status: DC | PRN
  Administered 2017-02-13: 10 mL via INTRA_ARTERIAL

## 2017-02-13 MED ORDER — SODIUM CHLORIDE 0.9 % IV SOLN
INTRAVENOUS | Status: DC
  Administered 2017-02-13: 06:00:00 via INTRAVENOUS

## 2017-02-13 MED ORDER — HEPARIN (PORCINE) IN NACL 2-0.9 UNIT/ML-% IJ SOLN
INTRAMUSCULAR | Status: AC | PRN
  Administered 2017-02-13: 1000 mL

## 2017-02-13 MED ORDER — FENTANYL CITRATE (PF) 100 MCG/2ML IJ SOLN
INTRAMUSCULAR | Status: DC | PRN
  Administered 2017-02-13: 25 ug via INTRAVENOUS

## 2017-02-13 MED ORDER — ASPIRIN 81 MG PO CHEW: 81.0000 mg | CHEWABLE_TABLET | ORAL | Status: DC

## 2017-02-13 MED ORDER — HEPARIN SODIUM (PORCINE) 1000 UNIT/ML IJ SOLN
INTRAMUSCULAR | Status: DC | PRN
  Administered 2017-02-13: 4000 [IU] via INTRAVENOUS

## 2017-02-13 MED ORDER — MIDAZOLAM HCL 2 MG/2ML IJ SOLN
INTRAMUSCULAR | Status: AC
  Filled 2017-02-13: qty 2

## 2017-02-13 MED ORDER — MIDAZOLAM HCL 2 MG/2ML IJ SOLN
INTRAMUSCULAR | Status: DC | PRN
  Administered 2017-02-13: 1 mg via INTRAVENOUS

## 2017-02-13 MED ORDER — HEPARIN SODIUM (PORCINE) 1000 UNIT/ML IJ SOLN
INTRAMUSCULAR | Status: AC
  Filled 2017-02-13: qty 1

## 2017-02-13 SURGICAL SUPPLY — 13 items
CATH BALLN WEDGE 5F 110CM (CATHETERS) ×2 IMPLANT
CATH IMPULSE 5F ANG/FL3.5 (CATHETERS) ×2 IMPLANT
DEVICE RAD COMP TR BAND LRG (VASCULAR PRODUCTS) ×2 IMPLANT
GLIDESHEATH SLEND SS 6F .021 (SHEATH) ×2 IMPLANT
GUIDEWIRE .025 260CM (WIRE) ×2 IMPLANT
GUIDEWIRE INQWIRE 1.5J.035X260 (WIRE) ×1 IMPLANT
INQWIRE 1.5J .035X260CM (WIRE) ×2
KIT HEART LEFT (KITS) ×2 IMPLANT
PACK CARDIAC CATHETERIZATION (CUSTOM PROCEDURE TRAY) ×2 IMPLANT
SHEATH GLIDE SLENDER 4/5FR (SHEATH) ×2 IMPLANT
SYR MEDRAD MARK V 150ML (SYRINGE) ×2 IMPLANT
TRANSDUCER W/STOPCOCK (MISCELLANEOUS) ×2 IMPLANT
TUBING CIL FLEX 10 FLL-RA (TUBING) ×2 IMPLANT

## 2017-02-13 NOTE — Interval H&P Note (Signed)
History and Physical Interval Note:  02/13/2017 7:25 AM  [name] [name]  has presented today for surgery, with the diagnosis of cp  The various methods of treatment have been discussed with the patient and family. After consideration of risks, benefits and other options for treatment, the patient has consented to  Procedure(s): RIGHT/LEFT HEART CATH AND CORONARY ANGIOGRAPHY (N/A) as a surgical intervention .  The patient's history has been reviewed, patient examined, no change in status, stable for surgery.  I have reviewed the patient's chart and labs.  Questions were answered to the patient's satisfaction.   Cath Lab Visit (complete for each Cath Lab visit)  Clinical Evaluation Leading to the Procedure:   ACS: No.  Non-ACS:    Anginal Classification: CCS II  Anti-ischemic medical therapy: Minimal Therapy (1 class of medications)  Non-Invasive Test Results: No non-invasive testing performed  Prior CABG: No previous CABG        [name] 02/13/2017 7:25 AM

## 2017-02-13 NOTE — Discharge Instructions (Signed)

## 2017-02-18 ENCOUNTER — Encounter: Payer: Self-pay | Admitting: Cardiology

## 2017-02-18 ENCOUNTER — Ambulatory Visit: Payer: Medicare Other | Admitting: Cardiology

## 2017-02-18 VITALS — BP 162/78 | HR 63 | Ht 65.0 in | Wt 188.0 lb

## 2017-02-18 DIAGNOSIS — I059 Rheumatic mitral valve disease, unspecified: Secondary | ICD-10-CM

## 2017-02-18 DIAGNOSIS — I10 Essential (primary) hypertension: Secondary | ICD-10-CM

## 2017-02-18 DIAGNOSIS — I481 Persistent atrial fibrillation: Secondary | ICD-10-CM

## 2017-02-18 DIAGNOSIS — I5032 Chronic diastolic (congestive) heart failure: Secondary | ICD-10-CM

## 2017-02-18 DIAGNOSIS — I4819 Other persistent atrial fibrillation: Secondary | ICD-10-CM

## 2017-02-18 MED ORDER — AMLODIPINE BESYLATE 5 MG PO TABS: 5.0000 mg | ORAL_TABLET | Freq: Every day | ORAL | 3 refills | Status: DC

## 2017-02-18 MED ORDER — AMIODARONE HCL 200 MG PO TABS: ORAL_TABLET | ORAL | 3 refills | Status: DC

## 2017-02-18 MED ORDER — FUROSEMIDE 40 MG PO TABS
20.0000 mg | ORAL_TABLET | ORAL | 2 refills | Status: DC
Start: 2017-02-18 — End: 2017-04-09

## 2017-02-18 MED ORDER — METOPROLOL SUCCINATE ER 50 MG PO TB24: 150.0000 mg | ORAL_TABLET | Freq: Every day | ORAL | 3 refills | Status: DC

## 2017-02-18 NOTE — Patient Instructions (Signed)
Medication Instructions:   START AMIODARONE 200 MG 1 TABLET TWICE DAILY X 2 WEEKS THEN 1 TABLET ONCE DAILY  DECREASE METOPROLOL TO 150 MG ONCE DAILY= 1/2 AND 1/4 OF THE 200 MG TABLET ONCE DAILY  INCREASE AMLODIPINE TO 5 MG ONCE DAILY= 2 OF THE 2.5 MG TABLETS ONCE DAILY  TAKE FUROSEMIDE 20 MG EVERY OTHER DAY  Follow-Up:  Your physician recommends that you schedule a follow-up appointment in: 3 MONTHS WITH DR CRENSHAW   REFERRAL TO DR OWEN= [facility] FOR MITRAL VALVE DISEASE

## 2017-03-09 ENCOUNTER — Telehealth: Payer: Self-pay | Admitting: Cardiology

## 2017-03-09 ENCOUNTER — Other Ambulatory Visit: Payer: Self-pay | Admitting: Cardiology

## 2017-03-09 DIAGNOSIS — I4891 Unspecified atrial fibrillation: Secondary | ICD-10-CM

## 2017-03-09 MED ORDER — APIXABAN 5 MG PO TABS: 5.0000 mg | ORAL_TABLET | Freq: Two times a day (BID) | ORAL | 1 refills | Status: DC

## 2017-03-09 NOTE — Addendum Note (Signed)
Addended by: Harrington Challenger on: 03/09/2017 03:08 PM   Modules accepted: Orders

## 2017-03-09 NOTE — Telephone Encounter (Signed)
Spoke with patients wife and he has been having nausea and vomiting (clear) since before his last office visit Per wife this was discussed at visit and was felt to related to his sinuses.  It has gotten worse since starting the Amlodipine and Amiodarone and now he is refusing to take any of his medications. Wife concerned coming from medications and will like Dr Jacalyn Lefevre input.

## 2017-03-09 NOTE — Telephone Encounter (Signed)
Advised wife, verbalized understanding.  

## 2017-03-09 NOTE — Telephone Encounter (Signed)
New message  Pt wife verbalized that she is calling for the RN  He is refusing to take any of his medications because it is making him nauseated   And pt is unsure of which one  He has been nauseated since last ov

## 2017-03-09 NOTE — Telephone Encounter (Signed)
°*  STAT* If patient is at the pharmacy, call can be transferred to refill team.   1. Which medications need to be refilled? (please list name of each medication and dose if known) apixaban (ELIQUIS) 5 MG TABS tablet  2. Which pharmacy/location (including street and city if local pharmacy) is medication to be sent to? [facility] mail order 3. Do they need a 30 day or 90 day supply? [place]

## 2017-03-09 NOTE — Telephone Encounter (Signed)
Likely from amiodarone; DC amiodarone to see if this helps and continue remaining meds [name] [name]

## 2017-03-10 ENCOUNTER — Other Ambulatory Visit: Payer: Self-pay | Admitting: *Deleted

## 2017-03-10 ENCOUNTER — Encounter: Payer: Self-pay | Admitting: Thoracic Surgery (Cardiothoracic Vascular Surgery)

## 2017-03-10 ENCOUNTER — Institutional Professional Consult (permissible substitution): Payer: Medicare Other | Admitting: Thoracic Surgery (Cardiothoracic Vascular Surgery)

## 2017-03-10 VITALS — BP 128/77 | HR 64 | Resp 20 | Ht 65.0 in | Wt 188.0 lb

## 2017-03-10 DIAGNOSIS — I481 Persistent atrial fibrillation: Secondary | ICD-10-CM

## 2017-03-10 DIAGNOSIS — I340 Nonrheumatic mitral (valve) insufficiency: Secondary | ICD-10-CM

## 2017-03-10 DIAGNOSIS — I4819 Other persistent atrial fibrillation: Secondary | ICD-10-CM

## 2017-03-10 DIAGNOSIS — I71019 Dissection of thoracic aorta, unspecified: Secondary | ICD-10-CM

## 2017-03-10 DIAGNOSIS — I7101 Dissection of thoracic aorta: Secondary | ICD-10-CM

## 2017-03-10 NOTE — Progress Notes (Signed)
c 

## 2017-03-10 NOTE — Patient Instructions (Signed)
Continue all previous medications without any changes at this time  

## 2017-03-10 NOTE — Progress Notes (Signed)
[address]       ,[place] [address]             [phone]     CARDIOTHORACIC SURGERY CONSULTATION REPORT  Referring Provider is [name], [name], MD PCP is Martinique, Betty G, MD  Chief Complaint  Patient presents with  . Atrial Fibrillation    Surgical eval, ECHO 01/02/17, Cardiac Cath 02/13/2017   . Mitral Regurgitation    HPI:  Patient is a 76 year old male with history of mitral regurgitation, recurrent persistent atrial fibrillation that has failed recent DC cardioversion, chronic diastolic congestive heart failure, hypertension, chronic kidney disease (stage II-III), hyperlipidemia, and GE reflux disease who has been referred for surgical consultation to discuss treatment options for management of mitral regurgitation and atrial fibrillation.  The patient states that he has been told that he had a heart murmur most of his life.  Approximately 6-8 months ago the patient began to develop worsening symptoms of exertional shortness of breath.  He was evaluated by his primary care physician and noted to be in atrial fibrillation.  He was referred for cardiology consultation and evaluated by Dr. [name] initially on October 27, 2016.  Baseline transthoracic echocardiogram performed October 31, 2016 revealed normal left ventricular systolic function with some restriction of the posterior leaflet motion and mitral regurgitation that was "not well characterized" but felt to be mild in severity.  The patient was anticoagulated using Eliquis and brought in for elective TEE and cardioversion on January 02, 2017.  TEE revealed severe mitral regurgitation but there was no sign of left atrial thrombus.  The patient underwent DC cardioversion but did not maintain.  Left and right heart catheterization performed February 13, 2017 revealed normal coronary artery anatomy with minimal nonobstructive coronary artery disease.  Catheterization confirmed the presence of severe  mitral regurgitation.  Pulmonary artery pressures were mildly elevated.  He was seen in follow-up by Dr. [name] on February 18, 2017 and subsequently started on amiodarone.  Surgical consultation was requested.  The patient is married and lives with his wife and [place].  He has been retired for approximately 10 years, having previously worked in [employer].  He states that he had remained reasonably active physically up until last summer when he began to experience fairly rapid decline in exercise tolerance with worsening exertional shortness of breath.  He now gets short of breath with moderate and low-level activity.  He denies resting shortness of breath, PND, orthopnea, palpitations, or syncope.  He has occasional dizzy spells and mild bilateral lower extremity edema.  He has developed some mild problems with balance and ambulates using a cane for stability.  He has experienced 2 or 3 mechanical falls.  The patient's wife states that he has developed some mild short-term memory problems but the patient denies this.  He has not had any significant bleeding complications since he has been anticoagulated using Eliquis.   Past Medical History:  Diagnosis Date  . Allergy   . Anxiety   . Diabetes mellitus without complication ([place])   . GERD (gastroesophageal reflux disease)   . Glaucoma   . High cholesterol   . Hypertension   . Renal insufficiency   . Seasonal allergies   . Severe mitral insufficiency   . TIA (transient ischemic attack)     Past Surgical History:  Procedure Laterality Date  . CARDIOVERSION N/A 01/02/2017   Procedure: CARDIOVERSION;  Surgeon: Lelon Perla, MD;  Location:  [facility];  Service: Cardiovascular;  Laterality: N/A;  . KNEE SURGERY    . RIGHT/LEFT HEART CATH AND CORONARY ANGIOGRAPHY N/A 02/13/2017   Procedure: RIGHT/LEFT HEART CATH AND CORONARY ANGIOGRAPHY;  Surgeon: Martinique, Peter M, MD;  Location: [facility];  Service:  Cardiovascular;  Laterality: N/A;  . TEE WITHOUT CARDIOVERSION N/A 01/02/2017   Procedure: TRANSESOPHAGEAL ECHOCARDIOGRAM (TEE);  Surgeon: Lelon Perla, MD;  Location: [facility] ENDOSCOPY;  Service: Cardiovascular;  Laterality: N/A;  . TONSILLECTOMY AND ADENOIDECTOMY  1943    Family History  Problem Relation Age of Onset  . Alzheimer's disease Mother   . COPD Father   . Cancer Sister        breast    Social History   Socioeconomic History  . Marital status: Married    Spouse name: Not on file  . Number of children: 2  . Years of education: Not on file  . Highest education level: Not on file  Social Needs  . Financial resource strain: Not on file  . Food insecurity - worry: Not on file  . Food insecurity - inability: Not on file  . Transportation needs - medical: Not on file  . Transportation needs - non-medical: Not on file  Occupational History  . Not on file  Tobacco Use  . Smoking status: Former Research scientist (life sciences)  . Smokeless tobacco: Never Used  Substance and Sexual Activity  . Alcohol use: Yes    Comment: a few beers a day  . Drug use: No  . Sexual activity: Not on file  Other Topics Concern  . Not on file  Social History Narrative  . Not on file    Current Outpatient Medications  Medication Sig Dispense Refill  . acetaminophen (TYLENOL) 500 MG tablet Take 500 mg by mouth 2 (two) times daily as needed.    . ALPRAZolam (XANAX) 0.25 MG tablet Take 0.25 mg by mouth daily.     Marland Kitchen amLODipine (NORVASC) 5 MG tablet Take 1 tablet (5 mg total) by mouth daily. 90 tablet 3  . apixaban (ELIQUIS) 5 MG TABS tablet Take 1 tablet (5 mg total) by mouth 2 (two) times daily. 180 tablet 1  . atorvastatin (LIPITOR) 80 MG tablet Take 80 mg by mouth daily.     . Bilberry, Vaccinium myrtillus, (BILBERRY PO) Take 1 capsule by mouth daily.    . Coenzyme Q10 (CO Q 10) 100 MG CAPS Take 1 capsule by mouth daily.    . furosemide (LASIX) 40 MG tablet Take 0.5 tablets (20 mg total) by mouth every other  day. 40 tablet 2  . irbesartan (AVAPRO) 300 MG tablet Take 300 mg by mouth daily.    Javier Docker Oil 500 MG CAPS Take 1 capsule by mouth daily.    Marland Kitchen L-Arginine 500 MG TABS Take 1 tablet by mouth daily.    . metFORMIN (GLUCOPHAGE) 500 MG tablet Take 1 tablet (500 mg total) by mouth 2 (two) times daily with a meal.    . metoprolol (TOPROL-XL) 50 MG 24 hr tablet Take 3 tablets (150 mg total) by mouth daily. 270 tablet 3  . Multiple Vitamins-Minerals (MULTIVITAMIN WITH MINERALS) tablet Take 1 tablet by mouth daily.    . sertraline (ZOLOFT) 50 MG tablet Take 50 mg by mouth daily.     No current facility-administered medications for this visit.     Allergies  Allergen Reactions  . Penicillins Anaphylaxis    Has patient had a PCN reaction causing immediate rash, facial/tongue/throat swelling,  SOB or lightheadedness with hypotension: yes Has patient had a PCN reaction causing severe rash involving mucus membranes or skin necrosis: no Has patient had a PCN reaction that required hospitalization: no Has patient had a PCN reaction occurring within the last 10 years: no If all of the above answers are "NO", then may proceed with Cephalosporin use.   . Shellfish Allergy Anaphylaxis  . Fenofibrate     Dr told pt to discontinue med      Review of Systems:   General:  decreased appetite, decreased energy, no weight gain, no weight loss, no fever  Cardiac:  no chest pain with exertion, no chest pain at rest, + SOB with exertion, no resting SOB, no PND, no orthopnea, no palpitations, + arrhythmia, + atrial fibrillation, + LE edema, + dizzy spells, no syncope  Respiratory:  exertional shortness of breath, no home oxygen, no productive cough, chronic dry cough, no bronchitis, no wheezing, no hemoptysis, no asthma, no pain with inspiration or cough, no sleep apnea, no CPAP at night  GI:   no difficulty swallowing, no reflux, no frequent heartburn, no hiatal hernia, no abdominal pain, no constipation, no  diarrhea, no hematochezia, no hematemesis, no melena  GU:   no dysuria,  + frequency, no urinary tract infection, no hematuria, no enlarged prostate, no kidney stones, no kidney disease  Vascular:  no pain suggestive of claudication, no pain in feet, no leg cramps, no varicose veins, no DVT, no non-healing foot ulcer  Neuro:   no stroke, + TIA's, no seizures, no headaches, no temporary blindness one eye,  no slurred speech, no peripheral neuropathy, no chronic pain, mild instability of gait, possible mild memory/cognitive dysfunction  Musculoskeletal: no arthritis, no joint swelling, no myalgias, minor difficulty walking, somewhat decreased mobility   Skin:   no rash, no itching, no skin infections, no pressure sores or ulcerations  Psych:   no anxiety, no depression, no nervousness, no unusual recent stress  Eyes:   no blurry vision, no floaters, no recent vision changes, + wears glasses or contacts  ENT:   no hearing loss, no loose or painful teeth, no dentures, last saw dentist within the past month  Hematologic:  + easy bruising, no abnormal bleeding, no clotting disorder, no frequent epistaxis  Endocrine:  + diabetes, does check CBG's at home     Physical Exam:   BP 128/77   Pulse 64   Resp 20   Ht 5\' 5"  (1.651 m)   Wt 188 lb (85.3 kg)   SpO2 96% Comment: RA  BMI 31.28 kg/m   General:    well-appearing  HEENT:  Unremarkable   Neck:   no JVD, no bruits, no adenopathy   Chest:   clear to auscultation, symmetrical breath sounds, no wheezes, no rhonchi   CV:   RRR, grade III/VI holosystolic murmur   Abdomen:  soft, non-tender, no masses   Extremities:  warm, well-perfused, pulses palpable, trace LE edema  Rectal/GU  Deferred  Neuro:   Grossly non-focal and symmetrical throughout  Skin:   Clean and dry, no rashes, no breakdown   Diagnostic Tests:  Transthoracic Echocardiography  Patient:    Balian, Schaller MR #:       [id number] Study Date: 10/31/2016 Gender:      M Age:        59 Height:     165.1 cm Weight:     83.5 kg BSA:        1.98 m^2 Pt.  Status: Room:   ATTENDING    Jenkins Rouge, M.D.  ORDERING     Martinique, Betty G  REFERRING    Martinique, Loma G  SONOGRAPHER  Cindy Hazy, RDCS  PERFORMING   [facility]  cc:  ------------------------------------------------------------------- LV EF: 60% -   65%  ------------------------------------------------------------------- Indications:      I48.91 Atrial Fibrillation.  ------------------------------------------------------------------- History:   PMH:  Acquired from the patient and from the patient&'s chart.  PMH:  Renal Insufficiency. Anxiety.  Risk factors:  Former tobacco use. Hypertension. Diabetes mellitus. Hypercholesterolemia.  ------------------------------------------------------------------- Study Conclusions  - Left ventricle: Moderate basal septal hypertrophy. The cavity   size was normal. Systolic function was normal. The estimated   ejection fraction was in the range of 60% to 65%. Wall motion was   normal; there were no regional wall motion abnormalities. - Aortic valve: There was trivial regurgitation. - Mitral valve: Restricted posterior leaflet motion MR not well   characterized but likely mild - Left atrium: The atrium was moderately dilated. - Atrial septum: No defect or patent foramen ovale was identified. - Pericardium, extracardiac: A trivial pericardial effusion was   identified.  ------------------------------------------------------------------- Study data:   Study status:  Routine.  Procedure:  The patient reported no pain pre or post test. Transthoracic echocardiography for left ventricular function evaluation, for right ventricular function evaluation, and for assessment of valvular function. Image quality was adequate.  Study completion:  There were no complications.          Transthoracic echocardiography.  M-mode, complete 2D,  spectral Doppler, and color Doppler.  Birthdate: Patient birthdate: 1940/09/10.  Age:  Patient is 77 yr old.  Sex: Gender: male.    BMI: 30.6 kg/m^2.  Blood pressure:     152/72 Patient status:  Outpatient.  Study date:  Study date: 10/31/2016. Study time: 03:14 PM.  Location:  [facility]  -------------------------------------------------------------------  ------------------------------------------------------------------- Left ventricle:  Moderate basal septal hypertrophy. The cavity size was normal. Systolic function was normal. The estimated ejection fraction was in the range of 60% to 65%. Wall motion was normal; there were no regional wall motion abnormalities.  ------------------------------------------------------------------- Aortic valve:   Trileaflet; mildly thickened leaflets. Mobility was not restricted.  Doppler:  Transvalvular velocity was within the normal range. There was no stenosis. There was trivial regurgitation.  ------------------------------------------------------------------- Aorta:  The aorta was normal, not dilated, and non-diseased. Aortic root: The aortic root was normal in size.  ------------------------------------------------------------------- Mitral valve:  Restricted posterior leaflet motion MR not well characterized but likely mild  Mildly thickened leaflets . Doppler:  Transvalvular velocity was within the normal range. There was no evidence for stenosis. There was no regurgitation.    Peak gradient (D): 8 mm Hg.  ------------------------------------------------------------------- Left atrium:  The atrium was moderately dilated.  ------------------------------------------------------------------- Atrial septum:  No defect or patent foramen ovale was identified.   ------------------------------------------------------------------- Right ventricle:  The cavity size was normal. Wall thickness was normal. Systolic function was  normal.  ------------------------------------------------------------------- Pulmonic valve:    Doppler:  Transvalvular velocity was within the normal range. There was no evidence for stenosis. There was mild regurgitation.  ------------------------------------------------------------------- Tricuspid valve:   Structurally normal valve.    Doppler: Transvalvular velocity was within the normal range. There was mild regurgitation.  ------------------------------------------------------------------- Pulmonary artery:   The main pulmonary artery was normal-sized. Systolic pressure was within the normal range.  ------------------------------------------------------------------- Right atrium:  The atrium was normal in size.  ------------------------------------------------------------------- Pericardium:  A trivial pericardial effusion  was identified.  ------------------------------------------------------------------- Systemic veins: Inferior vena cava: The vessel was normal in size. The respirophasic diameter changes were in the normal range (>= 50%), consistent with normal central venous pressure.  ------------------------------------------------------------------- Post procedure conclusions Ascending Aorta:  - The aorta was normal, not dilated, and non-diseased.  ------------------------------------------------------------------- Measurements   Left ventricle                         Value        Reference  LV ID, ED, PLAX chordal        (L)     38.5  mm     43 - 52  LV ID, ES, PLAX chordal                26.5  mm     23 - 38  LV fx shortening, PLAX chordal         31    %      >=29  LV PW thickness, ED                    15    mm     ---------  IVS/LV PW ratio, ED                    1.09         <=1.3  Stroke volume, 2D                      74    ml     ---------  Stroke volume/bsa, 2D                  37    ml/m^2 ---------    Ventricular septum                      Value        Reference  IVS thickness, ED                      16.4  mm     ---------    LVOT                                   Value        Reference  LVOT ID, S                             22    mm     ---------  LVOT area                              3.8   cm^2   ---------  LVOT ID                                22    mm     ---------  LVOT peak velocity, S                  104   cm/s   ---------  LVOT mean velocity, S                  72.6  cm/s   ---------  LVOT VTI, S                            19.5  cm     ---------  LVOT peak gradient, S                  4     mm Hg  ---------  Stroke volume (SV), LVOT DP            74.1  ml     ---------  Stroke index (SV/bsa), LVOT DP         37.4  ml/m^2 ---------    Aorta                                  Value        Reference  Aortic root ID, ED                     34    mm     ---------  Ascending aorta ID, A-P, S             33    mm     ---------    Left atrium                            Value        Reference  LA ID, A-P, ES                         49    mm     ---------  LA ID/bsa, A-P                 (H)     2.47  cm/m^2 <=2.2  LA volume, S                           90    ml     ---------  LA volume/bsa, S                       45.4  ml/m^2 ---------  LA volume, ES, 1-p A4C                 85    ml     ---------  LA volume/bsa, ES, 1-p A4C             42.8  ml/m^2 ---------  LA volume, ES, 1-p A2C                 92    ml     ---------  LA volume/bsa, ES, 1-p A2C             46.4  ml/m^2 ---------    Mitral valve                           Value        Reference  Mitral E-wave peak velocity            139   cm/s   ---------  Mitral deceleration time       (L)     137   ms     150 - 230  Mitral peak  gradient, D                8     mm Hg  ---------    Tricuspid valve                        Value        Reference  Tricuspid regurg peak velocity         401   cm/s   ---------  Tricuspid peak RV-RA gradient          64    mm Hg   ---------    Right ventricle                        Value        Reference  RV s&', lateral, S                      15.9  cm/s   ---------  Legend: (L)  and  (H)  mark values outside specified reference range.  ------------------------------------------------------------------- Prepared and Electronically Authenticated by  Jenkins Rouge, M.D. 2018-09-28T16:52:22   Transesophageal Echocardiography with Cardioversion  Patient:    Talmadge, Ganas MR #:       [id number] Study Date: 01/02/2017 Gender:     M Age:        30 Height:     165.1 cm Weight:     84.1 kg BSA:        1.99 m^2 Pt. Status: Room:   ADMITTING    Kirk Ruths  ATTENDING    Kirk Ruths  ORDERING     Kirk Ruths  PERFORMING   Kirk Ruths  REFERRING    Kirk Ruths  SONOGRAPHER  Jannett Celestine, RDCS  cc:  ------------------------------------------------------------------- LV EF: 60% -   65%  ------------------------------------------------------------------- History:   PMH:  Atrial Fibrillation.  Atrial fibrillation. Transient ischemic attack.  Risk factors:  Hypertension. Diabetes mellitus. Dyslipidemia.  ------------------------------------------------------------------- Study Conclusions  - Left ventricle: Systolic function was normal. The estimated   ejection fraction was in the range of 60% to 65%. Wall motion was   normal; there were no regional wall motion abnormalities. No   evidence of thrombus. - Aortic valve: No evidence of vegetation. There was trivial   regurgitation. - Mitral valve: Mobility of the posterior leaflet was moderately   restricted. Mild prolapse, involving the anterior leaflet. There   was severe regurgitation. - Left atrium: The atrium was moderately dilated. No evidence of   thrombus in the atrial cavity or appendage. No evidence of   thrombus in the atrial cavity or appendage. - Right atrium: No evidence of thrombus in the atrial cavity  or   appendage. - Atrial septum: There was a patent foramen ovale. There was an   atrial septal aneurysm. - Tricuspid valve: No evidence of vegetation. - Pulmonic valve: No evidence of vegetation.  Impressions:  - Normal LV systolic function; trace AI; restricted posterior MV   leaflet and prolapse of anterior leaflet with severe, eccentric   MR; moderate LAE with no LAA thrombus; atrial septal aneurysm   with PFO.  ------------------------------------------------------------------- Study data:   Study status:  Routine.  Consent:  The risks, benefits, and alternatives to the procedure were explained to the patient and informed consent was obtained.  Procedure:  The patient reported no pain pre or post test. Initial setup. The patient was brought to the laboratory in  the fasting state. A baseline ECG was recorded. Intravenous access was obtained. Surface ECG leads and pulse oximetric signals were monitored. Self-adhesive anterior-posterior defibrillation pads were applied. Sedation. Moderate sedation with intermittent deep sedation was administered during cardioversion by anesthesiology staff. Transesophageal echocardiography. A transesophageal probe was inserted by the attending cardiologistwithout difficulty. Image quality was excellent. Images were captured in a quad screen format to simplify data comparison. No intracardiac thrombus was identified. . The rhythm was successfully converted from atrial fibrillation to normal sinus rhythm.  Study completion:  All IVs inserted during the procedure were removed. The patient tolerated the procedure well. There were no complications.          Transesophageal echocardiography with cardioversion.  2D and intravenous contrast injection.  Birthdate:  Patient birthdate: Apr 18, 1940.  Age: Patient is 77 yr old.  Sex:  Gender: male.    BMI: 30.8 kg/m^2. Blood pressure:     154/77  Patient status:  Outpatient.  Study date:  Study date:  01/02/2017. Study time: 08:15 AM.  Location: Endoscopy.  -------------------------------------------------------------------  ------------------------------------------------------------------- Left ventricle:  Systolic function was normal. The estimated ejection fraction was in the range of 60% to 65%. Wall motion was normal; there were no regional wall motion abnormalities.  No evidence of thrombus.  ------------------------------------------------------------------- Aortic valve:   Mildly thickened leaflets. Cusp separation was normal.  No evidence of vegetation.  Doppler:  There was trivial regurgitation.  ------------------------------------------------------------------- Aorta:  Descending aorta: The descending aorta had mild diffuse disease.  ------------------------------------------------------------------- Mitral valve:  Mobility of the posterior leaflet was moderately restricted.  Mild prolapse, involving the anterior leaflet. Doppler:  There was severe regurgitation.  ------------------------------------------------------------------- Left atrium:  The atrium was moderately dilated.  No evidence of thrombus in the atrial cavity or appendage.  No evidence of thrombus in the atrial cavity or appendage.  ------------------------------------------------------------------- Atrial septum:  There was a patent foramen ovale. There was an atrial septal aneurysm.  ------------------------------------------------------------------- Right ventricle:  The cavity size was normal. Systolic function was normal.  ------------------------------------------------------------------- Pulmonic valve:    Structurally normal valve.   Cusp separation was normal.  No evidence of vegetation.  Doppler:  There was trivial regurgitation.  ------------------------------------------------------------------- Tricuspid valve:   Structurally normal valve.   Leaflet separation was  normal.  No evidence of vegetation.  Doppler:  There was mild regurgitation.  ------------------------------------------------------------------- Right atrium:  The atrium was normal in size.  No evidence of thrombus in the atrial cavity or appendage.  ------------------------------------------------------------------- Pericardium:  There was no pericardial effusion.   ------------------------------------------------------------------- Prepared and Electronically Authenticated by  Kirk Ruths 2018-11-30T18:06:39   RIGHT/LEFT HEART CATH AND CORONARY ANGIOGRAPHY  Conclusion     Prox LAD lesion is 20% stenosed.  Prox RCA lesion is 25% stenosed.  The left ventricular systolic function is normal.  LV end diastolic pressure is normal.  The left ventricular ejection fraction is greater than 65% by visual estimate.  There is severe (4+) mitral regurgitation.  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   1. Mild nonobstructive CAD 2. Normal LV function 3. Mild pulmonary HTN 4. Elevated LV filling pressures with large V wave on PCWP tracing to 42 mm Hg 5. Normal cardiac output.  Plan: Consider for MV repair.    Indications   Severe mitral insufficiency [I34.0 (ICD-10-CM)]  Procedural Details/Technique   Technical Details Indication: 77 yo WM with severe mitral insufficiency.  Procedural Details: The right wrist was prepped, draped, and anesthetized with 1% lidocaine. Using the modified Seldinger technique a  6 Fr slender sheath was placed in the right radial artery and a 5 French sheath was placed in the right brachial vein. A Swan-Ganz catheter was used for the right heart catheterization. Standard protocol was followed for recording of right heart pressures and sampling of oxygen saturations. Fick cardiac output was calculated. Standard Judkins catheters were used for selective coronary angiography and left ventriculography. There were no  immediate procedural complications. The patient was transferred to the post catheterization recovery area for further monitoring.  Contrast: 60 cc   Estimated blood loss <50 mL.  During this procedure the patient was administered the following to achieve and maintain moderate conscious sedation: Versed 1 mg, Fentanyl 25 mcg, while the patient's heart rate, blood pressure, and oxygen saturation were continuously monitored. The period of conscious sedation was 29 minutes, of which I was present face-to-face 100% of this time.  Complications   Complications documented before study signed (02/13/2017 8:31 AM EST)    No complications were associated with this study.  Documented by Martinique, Peter M, MD - 02/13/2017 8:29 AM EST    Coronary Findings   Diagnostic  Dominance: Right  Left Main  Vessel was injected. Vessel is normal in caliber. Vessel is angiographically normal.  Left Anterior Descending  Vessel was injected. Vessel is normal in caliber. There is mild diffuse disease throughout the vessel.  Prox LAD lesion 20% stenosed  Prox LAD lesion is 20% stenosed.  Left Circumflex  Vessel was injected. Vessel is normal in caliber. Vessel is angiographically normal.  Right Coronary Artery  Prox RCA lesion 25% stenosed  Prox RCA lesion is 25% stenosed.  Intervention   No interventions have been documented.  Right Heart   Right Heart Pressures Hemodynamic findings consistent with mild pulmonary hypertension. Elevated LV EDP consistent with volume overload.  Wall Motion              Left Heart   Left Ventricle The left ventricular size is normal. The left ventricular systolic function is normal. LV end diastolic pressure is normal. The left ventricular ejection fraction is greater than 65% by visual estimate. No regional wall motion abnormalities.  Mitral Valve There is severe (4+) mitral regurgitation.  Coronary Diagrams   Diagnostic Diagram       Implants     No implant  documentation for this case.  MERGE Images   Show images for CARDIAC CATHETERIZATION   Link to Procedure Log   Procedure Log    Hemo Data    Most Recent Value  Fick Cardiac Output 4.8 L/min  Fick Cardiac Output Index 2.57 (L/min)/BSA  RA A Wave 10 mmHg  RA V Wave 10 mmHg  RA Mean 9 mmHg  RV Systolic Pressure 44 mmHg  RV Diastolic Pressure 3 mmHg  RV EDP 9 mmHg  PA Systolic Pressure 50 mmHg  PA Diastolic Pressure 22 mmHg  PA Mean 33 mmHg  PW A Wave 23 mmHg  PW V Wave 40 mmHg  PW Mean 24 mmHg  AO Systolic Pressure 035 mmHg  AO Diastolic Pressure 70 mmHg  AO Mean 95 mmHg  LV Systolic Pressure 009 mmHg  LV Diastolic Pressure 11 mmHg  LV EDP 16 mmHg  Arterial Occlusion Pressure Extended Systolic Pressure 381 mmHg  Arterial Occlusion Pressure Extended Diastolic Pressure 68 mmHg  Arterial Occlusion Pressure Extended Mean Pressure 94 mmHg  Left Ventricular Apex Extended Systolic Pressure 829 mmHg  Left Ventricular Apex Extended Diastolic Pressure 11 mmHg  Left Ventricular Apex Extended EDP Pressure  17 mmHg  QP/QS 1  TPVR Index 12.86 HRUI  TSVR Index 37.04 HRUI  PVR SVR Ratio 0.1  TPVR/TSVR Ratio 0.35     Impression:  Patient has stage D severe symptomatic primary mitral regurgitation with recurrent persistent atrial fibrillation that has failed DC cardioversion.  He presents with worsening symptoms of exertional shortness of breath and fatigue consistent with chronic diastolic congestive heart failure, New York Heart Association functional class III.  I have personally reviewed the patient's recent transesophageal echocardiogram and diagnostic cardiac catheterization.  TEE revealed severe mitral regurgitation.  The posterior leaflet may be somewhat restricted and/or hypoplastic.  The anterior leaflet overrides the posterior leaflet and there may be some chordal elongation or even perhaps 1 or 2 ruptured primary chordae tendinae in the middle of the A2 segment of the anterior  leaflet.  There is severe mitral regurgitation directed posteriorly around the entire left atrium.  There is left atrial enlargement.  Left ventricular size and systolic function appears normal.  Diagnostic cardiac catheterization is notable for the absence of significant coronary artery disease and confirmed the presence of severe mitral regurgitation.  There was mild pulmonary hypertension.  I agree the patient needs elective mitral valve repair.  He would likely benefit from concomitant Maze procedure.  He may be reasonably good candidate for minimally invasive approach for surgery.   Plan:  The patient and his wife were counseled at length regarding the indications, risks and potential benefits of mitral valve repair.  The rationale for elective surgery has been explained, including a comparison between surgery and continued medical therapy with close follow-up.  The likelihood of successful and durable valve repair has been discussed with particular reference to the findings of their recent echocardiogram.  Based upon these findings and previous experience, I have quoted them a greater than 90 percent likelihood of successful valve repair.  In the unlikely event that their valve cannot be successfully repaired, we discussed the possibility of replacing the mitral valve using a mechanical prosthesis with the attendant need for long-term anticoagulation versus the alternative of replacing it using a bioprosthetic tissue valve with its potential for late structural valve deterioration and failure, depending upon the patient's longevity.  The patient specifically requests that if the mitral valve must be replaced that it be done using a bioprosthetic tissue valve.   The relative risks and benefits of performing a maze procedure at the time of their surgery was discussed at length, including the expected likelihood of long term freedom from recurrent symptomatic atrial fibrillation and/or atrial flutter.   Alternative surgical approaches have been discussed including a comparison between conventional sternotomy and minimally-invasive techniques.  The relative risks and benefits of each have been reviewed as they pertain to the patient's specific circumstances, and all of their questions have been addressed.  Expectations for the patient's postoperative convalescence have been discussed.  We tentatively plan to proceed with surgery on April 08, 2017.  The patient will undergo CT angiography to evaluate the feasibility of peripheral arterial cannulation for surgery.  The patient will return to our office for follow-up prior to surgery on March 30, 2017 at which time we will discuss timing of when he should stop taking Eliquis and make final plans for surgery.   I spent in excess of 90 minutes during the conduct of this office consultation and >50% of this time involved direct face-to-face encounter with the patient for counseling and/or coordination of their care.  Valentina Gu. Roxy Manns, MD 03/10/2017 1:46  PM

## 2017-03-13 LAB — HM DIABETES EYE EXAM

## 2017-03-20 ENCOUNTER — Other Ambulatory Visit: Payer: Medicare Other

## 2017-03-20 ENCOUNTER — Ambulatory Visit
Admission: RE | Admit: 2017-03-20 | Discharge: 2017-03-20 | Disposition: A | Discharge status: Home / Self Care | Payer: Medicare Other | Source: Ambulatory Visit | Attending: Thoracic Surgery (Cardiothoracic Vascular Surgery) | Admitting: Thoracic Surgery (Cardiothoracic Vascular Surgery)

## 2017-03-20 ENCOUNTER — Inpatient Hospital Stay: Admission: RE | Admit: 2017-03-20 | Payer: Medicare Other | Source: Ambulatory Visit

## 2017-03-20 DIAGNOSIS — I71019 Dissection of thoracic aorta, unspecified: Secondary | ICD-10-CM

## 2017-03-20 DIAGNOSIS — I7101 Dissection of thoracic aorta: Secondary | ICD-10-CM

## 2017-03-20 MED ORDER — IOPAMIDOL (ISOVUE-370) INJECTION 76%
75.0000 mL | Freq: Once | INTRAVENOUS | Status: AC | PRN
  Administered 2017-03-20: 75 mL via INTRAVENOUS

## 2017-03-30 ENCOUNTER — Encounter: Payer: Self-pay | Admitting: Thoracic Surgery (Cardiothoracic Vascular Surgery)

## 2017-03-30 ENCOUNTER — Ambulatory Visit: Payer: Medicare Other | Admitting: Thoracic Surgery (Cardiothoracic Vascular Surgery)

## 2017-03-30 ENCOUNTER — Other Ambulatory Visit: Payer: Self-pay

## 2017-03-30 ENCOUNTER — Ambulatory Visit: Payer: Medicare Other | Attending: Thoracic Surgery (Cardiothoracic Vascular Surgery) | Admitting: Physical Therapy

## 2017-03-30 ENCOUNTER — Encounter: Payer: Self-pay | Admitting: Physical Therapy

## 2017-03-30 VITALS — BP 142/75 | HR 88 | Resp 20 | Ht 65.0 in | Wt 187.0 lb

## 2017-03-30 DIAGNOSIS — R2689 Other abnormalities of gait and mobility: Secondary | ICD-10-CM

## 2017-03-30 DIAGNOSIS — R293 Abnormal posture: Secondary | ICD-10-CM

## 2017-03-30 DIAGNOSIS — I340 Nonrheumatic mitral (valve) insufficiency: Secondary | ICD-10-CM

## 2017-03-30 DIAGNOSIS — I481 Persistent atrial fibrillation: Secondary | ICD-10-CM | POA: Diagnosis not present

## 2017-03-30 DIAGNOSIS — I4819 Other persistent atrial fibrillation: Secondary | ICD-10-CM

## 2017-03-30 DIAGNOSIS — M6281 Muscle weakness (generalized): Secondary | ICD-10-CM

## 2017-03-30 NOTE — Progress Notes (Signed)
[address]       [place] [address]             [phone]     CARDIOTHORACIC SURGERY OFFICE NOTE  Referring Provider is [name], [name], MD PCP is Martinique, Betty G, MD   HPI:  Patient returns to the office today for follow-up of severe symptomatic primary mitral regurgitation and persistent atrial fibrillation.  He was originally seen in consultation on March 10, 2017.  He stopped taking Eliquis in anticipation of surgery last week.  He states that since he stopped taking Eliquis he feels much better.  He claims that his breathing is better and he has been more steady on his feet.  He is looking forward to getting his surgery behind him.  The remainder of his review of systems is unchanged from previously.   Current Outpatient Medications  Medication Sig Dispense Refill  . acetaminophen (TYLENOL) 500 MG tablet Take 1,000 mg by mouth 2 (two) times daily.     Marland Kitchen ALPRAZolam (XANAX) 0.25 MG tablet Take 0.25 mg by mouth daily.     Marland Kitchen amLODipine (NORVASC) 5 MG tablet Take 1 tablet (5 mg total) by mouth daily. 90 tablet 3  . apixaban (ELIQUIS) 5 MG TABS tablet Take 1 tablet (5 mg total) by mouth 2 (two) times daily. 180 tablet 1  . atorvastatin (LIPITOR) 80 MG tablet Take 80 mg by mouth daily.     . Bilberry, Vaccinium myrtillus, (BILBERRY PO) Take 1 capsule by mouth daily.    . Coenzyme Q10 (CO Q 10) 100 MG CAPS Take 100 mg by mouth daily.     . furosemide (LASIX) 40 MG tablet Take 0.5 tablets (20 mg total) by mouth every other day. 40 tablet 2  . irbesartan (AVAPRO) 300 MG tablet Take 300 mg by mouth daily.    Javier Docker Oil 500 MG CAPS Take 500 mg by mouth daily.     Marland Kitchen L-Arginine 500 MG TABS Take 500 mg by mouth daily.     . metFORMIN (GLUCOPHAGE) 500 MG tablet Take 1 tablet (500 mg total) by mouth 2 (two) times daily with a meal.    . metoprolol (TOPROL-XL) 50 MG 24 hr tablet Take 3 tablets (150 mg total) by mouth daily. 270 tablet 3  . Multiple  Vitamins-Minerals (MULTIVITAMIN WITH MINERALS) tablet Take 1 tablet by mouth daily.    . sertraline (ZOLOFT) 50 MG tablet Take 50 mg by mouth daily.     No current facility-administered medications for this visit.       Physical Exam:   BP (!) 142/75   Pulse 88   Resp 20   Ht 5\' 5"  (1.651 m)   Wt 187 lb (84.8 kg)   SpO2 96% Comment: RA  BMI 31.12 kg/m   General:  Well-appearing  Chest:   Clear to auscultation with slightly diminished breath sounds right lung base  CV:   Regular rate and rhythm with prominent holosystolic murmur  Incisions:  n/a  Abdomen:  Soft nontender  Extremities:  Warm and well perfused  Diagnostic Tests:  CT ANGIOGRAPHY CHEST, ABDOMEN AND PELVIS  TECHNIQUE: Multidetector CT imaging through the chest, abdomen and pelvis was performed using the standard protocol during bolus administration of intravenous contrast. Multiplanar reconstructed images and MIPs were obtained and reviewed to evaluate the vascular anatomy.  CONTRAST:  44mL ISOVUE-370 IOPAMIDOL (ISOVUE-370) INJECTION 76%  COMPARISON:  None.  FINDINGS: CTA CHEST FINDINGS  Cardiovascular: Coronary  artery calcifications involving the LAD and right coronary artery. Heart size is normal with a small amount of pericardial fluid. Normal caliber of the main pulmonary arteries. Atherosclerotic disease at the aortic arch. Normal caliber of the thoracic aorta without dissection. There is a bovine arch. Mild atherosclerotic disease involving the great vessels without significant stenosis. Bilateral vertebral arteries are patent, the left vertebral artery appears to be dominant. Small amount of mural thrombus along the medial descending thoracic aorta but no definite intramural hematoma or dissection in this area. Prominent posterior intercostal arteries bilaterally.  Mediastinum/Nodes: Some calcifications in the anterior mediastinum. Subcarinal tissue measures 1.4 cm. Mildly prominent  mediastinal lymph nodes. Overall, no suspicious chest lymphadenopathy. Mild dilatation of the mid and proximal esophagus containing some fluid.  Lungs/Pleura: Trachea and mainstem bronchi are patent. Small to moderate sized bilateral pleural effusions. Pleural fluid may be slightly complex. Bibasilar chest densities are suggestive for atelectasis. Centrilobular emphysema in the upper lobes. Pleural-based reticular densities and the haziness, particularly in the left upper chest, are nonspecific but could be related to scarring or atelectasis.  Musculoskeletal: Deformity in the left scapula compatible with old fracture. Probable old fracture involving the lateral left clavicle. Old left rib fractures.  Review of the MIP images confirms the above findings.  CTA ABDOMEN AND PELVIS FINDINGS  VASCULAR  Aorta: Atherosclerotic disease in the abdominal aorta without aneurysm. Irregular mural thrombus at the 8 o'clock position on sequence 8, image 177 is suggestive for ulcerative plaque.  Celiac: Patent without evidence of aneurysm, dissection, vasculitis or significant stenosis.  SMA: Patent without evidence of aneurysm, dissection, vasculitis or significant stenosis.  Renals: There are 2 renal arteries bilaterally. Calcified plaque at the origin of the main right renal artery with mild stenosis. Early branch vessel involving the main right renal artery is patent. Accessory right renal artery comes off the anterior aspect of the abdominal aorta. Mild stenosis at the origin of the main left renal artery related to calcified plaque. Accessory left renal artery is just above and anterior to the main left renal artery. No evidence for aneurysm or FMD involving the renal arteries.  IMA: Inferior mesenteric artery is patent.  Inflow: Atherosclerotic calcifications involving the bilateral iliac arteries without aneurysm or significant stenosis. However, there is significant  calcified plaque involving the right common femoral artery causing nearly 50% narrowing of the lumen on sequence 8, image 285. Proximal right femoral arteries are patent. There is calcified plaque involving the left common femoral artery but there is no significant stenosis due to the left common femoral artery plaque. Proximal left femoral arteries are patent.  Veins: No obvious venous abnormality within the limitations of this arterial phase study.  NON VASCULAR  Hepatobiliary: Liver has a lobulated appearance but not clearly nodular. No focal liver abnormality. No biliary dilatation. The gallbladder is within normal limits.  Pancreas: Normal appearance of the pancreas without inflammation or duct dilatation.  Spleen: Normal appearance of spleen without enlargement.  Adrenals/Urinary Tract: Fullness of the left adrenal gland may be related to hyperplasia. Right adrenal gland is unremarkable. Probable cyst near the upper pole of the left kidney. There may be another small cyst in the medial left kidney. Perinephric edema bilaterally. No hydronephrosis. Contrast in the urinary bladder. Urinary bladder is nondistended.  Stomach/Bowel: Diverticulosis involving the proximal sigmoid colon and descending colon. No acute bowel inflammation. Normal appendix. No evidence for bowel obstruction. Stomach is unremarkable.  Lymphatic: No significant lymph node enlargement in the abdomen or pelvis.  Reproductive: Prostate is unremarkable.  Other: Mild mesenteric edema in the abdomen and pelvis. No free fluid in the abdomen or pelvis. No free intraperitoneal air.  Musculoskeletal: Multilevel disc space narrowing in lumbar spine most prominent at L5-S1.  Review of the MIP images confirms the above findings.  IMPRESSION: Vascular:  Atherosclerotic disease throughout the chest, abdomen and pelvis without aortic aneurysm or dissection. There is some irregular  mural thrombus in the abdominal aorta suggestive for ulcerative plaque.  Focal calcified plaque in the right common femoral artery causing approximately 50% stenosis.  Coronary artery calcifications.  Aortic Atherosclerosis (ICD10-I70.0).  Nonvascular:  Small to moderate bilateral pleural effusions. The pleural fluid may be mildly complex.  Emphysema (ICD10-J43.9).  Colonic diverticulosis.  Old chest trauma.   Electronically Signed   By: Markus Daft M.D.   On: 03/21/2017 09:35    Impression:  Patient has stage D severe symptomatic primary mitral regurgitation with recurrent persistent atrial fibrillation that has failed DC cardioversion.  He presents with worsening symptoms of exertional shortness of breath and fatigue consistent with chronic diastolic congestive heart failure, New York Heart Association functional class III.  I have personally reviewed the patient's recent transesophageal echocardiogram and diagnostic cardiac catheterization.  TEE revealed severe mitral regurgitation.  The posterior leaflet may be somewhat restricted and/or hypoplastic.  The anterior leaflet overrides the posterior leaflet and there may be some chordal elongation or even perhaps 1 or 2 ruptured primary chordae tendinae in the middle of the A2 segment of the anterior leaflet.  There is severe mitral regurgitation directed posteriorly around the entire left atrium.  There is left atrial enlargement.  Left ventricular size and systolic function appears normal.  Diagnostic cardiac catheterization is notable for the absence of significant coronary artery disease and confirmed the presence of severe mitral regurgitation.  There was mild pulmonary hypertension.  I agree the patient needs elective mitral valve repair.  He would likely benefit from concomitant Maze procedure.  He appears to be reasonably good candidate for minimally invasive approach for surgery.   Plan:  The patient and his wife  were again counseled at length regarding the indications, risks and potential benefits of mitral valve repair.  The rationale for elective surgery has been explained, including a comparison between surgery and continued medical therapy with close follow-up.  The likelihood of successful and durable valve repair has been discussed with particular reference to the findings of their recent echocardiogram.  Based upon these findings and previous experience, I have quoted them a greater than 90 percent likelihood of successful valve repair.  In the unlikely event that their valve cannot be successfully repaired, we discussed the possibility of replacing the mitral valve using a mechanical prosthesis with the attendant need for long-term anticoagulation versus the alternative of replacing it using a bioprosthetic tissue valve with its potential for late structural valve deterioration and failure, depending upon the patient's longevity.  The patient specifically requests that if the mitral valve must be replaced that it be done using a bioprosthetic tissue valve.   Alternative surgical approaches have been discussed including a comparison between conventional sternotomy and minimally-invasive techniques.  The relative risks and benefits of each have been reviewed as they pertain to the patient's specific circumstances, and all of their questions have been addressed.  The patient understands and accepts all potential risks of surgery including but not limited to risk of death, stroke or other neurologic complication, myocardial infarction, congestive heart failure, respiratory failure, renal failure, bleeding  requiring transfusion and/or reexploration, arrhythmia, infection or other wound complications, pneumonia, pleural and/or pericardial effusion, pulmonary embolus, aortic dissection or other major vascular complication, or delayed complications related to valve repair or replacement including but not limited to  structural valve deterioration and failure, thrombosis, embolization, endocarditis, or paravalvular leak.  Specific risks potentially related to the minimally-invasive approach were discussed at length, including but not limited to risk of conversion to full or partial sternotomy, aortic dissection or other major vascular complication, unilateral acute lung injury or pulmonary edema, phrenic nerve dysfunction or paralysis, rib fracture, chronic pain, lung hernia, or lymphocele.  All of their questions have been answered.    I spent in excess of 15 minutes during the conduct of this office consultation and >50% of this time involved direct face-to-face encounter with the patient for counseling and/or coordination of their care.    Valentina Gu. Roxy Manns, MD 03/30/2017 10:12 AM

## 2017-03-30 NOTE — Pre-Procedure Instructions (Signed)
[name] [name]  03/30/2017      [id number] - [place], [place] - [address] [place] [address] Phone: [phone] Fax: [phone]    Your procedure is scheduled on February 28  Report to [facility] at [place].M.  Call this number if you have problems the morning of surgery:  [phone]   Remember:  Do not eat food or drink liquids after midnight.  Continue all medications as directed by your physician except follow these medication instructions before surgery below   Take these medicines the morning of surgery with A SIP OF WATER  acetaminophen (TYLENOL)  ALPRAZolam (XANAX) amLODipine (NORVASC)  metoprolol (TOPROL-XL) sertraline (ZOLOFT)   7 days prior to surgery STOP taking any Aspirin(unless otherwise instructed by your surgeon), Aleve, Naproxen, Ibuprofen, Motrin, Advil, Goody's, BC's, all herbal medications, fish oil, and all vitamins  FOLLOW PHYSICIANS INSTRUCTIONS ABOUT ELIQUIS    WHAT DO I DO ABOUT MY DIABETES MEDICATION?   Marland Kitchen Do not take oral diabetes medicines (pills) the morning of surgery. Metformin   How to Manage Your Diabetes Before and After Surgery  Why is it important to control my blood sugar before and after surgery? . Improving blood sugar levels before and after surgery helps healing and can limit problems. . A way of improving blood sugar control is eating a healthy diet by: o  Eating less sugar and carbohydrates o  Increasing activity/exercise o  Talking with your doctor about reaching your blood sugar goals . High blood sugars (greater than 180 mg/dL) can raise your risk of infections and slow your recovery, so you will need to focus on controlling your diabetes during the weeks before surgery. . Make sure that the doctor who takes care of your diabetes knows about your planned surgery including the date and location.  How do I manage my blood sugar  before surgery? . Check your blood sugar at least 4 times a day, starting 2 days before surgery, to make sure that the level is not too high or low. o Check your blood sugar the morning of your surgery when you wake up and every 2 hours until you get to the Short Stay unit. . If your blood sugar is less than 70 mg/dL, you will need to treat for low blood sugar: o Do not take insulin. o Treat a low blood sugar (less than 70 mg/dL) with  cup of clear juice (cranberry or apple), 4 glucose tablets, OR glucose gel. o Recheck blood sugar in 15 minutes after treatment (to make sure it is greater than 70 mg/dL). If your blood sugar is not greater than 70 mg/dL on recheck, call [phone] for further instructions. . Report your blood sugar to the short stay nurse when you get to Short Stay.  . If you are admitted to the hospital after surgery: o Your blood sugar will be checked by the staff and you will probably be given insulin after surgery (instead of oral diabetes medicines) to make sure you have good blood sugar levels. o The goal for blood sugar control after surgery is 80-180 mg/dL.   Do not wear jewelry  Do not wear lotions, powders, or cologne, or deodorant.  Men may shave face and neck.  Do not bring valuables to the hospital.  [facility] is not responsible for any belongings or valuables.  Contacts, dentures or bridgework may not be worn into surgery.  Leave your suitcase in the car.  After surgery it may be brought to your room.  For patients admitted to the hospital, discharge time will be determined by your treatment team.  Patients discharged the day of surgery will not be allowed to drive home.    Special instructions:   [facility]- Preparing For Surgery  Before surgery, you can play an important role. Because skin is not sterile, your skin needs to be as free of germs as possible. You can reduce the number of germs on your skin by washing with CHG (chlorahexidine gluconate)  Soap before surgery.  CHG is an antiseptic cleaner which kills germs and bonds with the skin to continue killing germs even after washing.  Please do not use if you have an allergy to CHG or antibacterial soaps. If your skin becomes reddened/irritated stop using the CHG.  Do not shave (including legs and underarms) for at least 48 hours prior to first CHG shower. It is OK to shave your face.  Please follow these instructions carefully.   1. Shower the NIGHT BEFORE SURGERY and the MORNING OF SURGERY with CHG.   2. If you chose to wash your hair, wash your hair first as usual with your normal shampoo.  3. After you shampoo, rinse your hair and body thoroughly to remove the shampoo.  4. Use CHG as you would any other liquid soap. You can apply CHG directly to the skin and wash gently with a scrungie or a clean washcloth.   5. Apply the CHG Soap to your body ONLY FROM THE NECK DOWN.  Do not use on open wounds or open sores. Avoid contact with your eyes, ears, mouth and genitals (private parts). Wash Face and genitals (private parts)  with your normal soap.  6. Wash thoroughly, paying special attention to the area where your surgery will be performed.  7. Thoroughly rinse your body with warm water from the neck down.  8. DO NOT shower/wash with your normal soap after using and rinsing off the CHG Soap.  9. Pat yourself dry with a CLEAN TOWEL.  10. Wear CLEAN PAJAMAS to bed the night before surgery, wear comfortable clothes the morning of surgery  11. Place CLEAN SHEETS on your bed the night of your first shower and DO NOT SLEEP WITH PETS.    Day of Surgery: Do not apply any deodorants/lotions. Please wear clean clothes to the hospital/surgery center.      Please read over the following fact sheets that you were given.

## 2017-03-30 NOTE — Patient Instructions (Addendum)
Do not take Eliquis.  Stop taking Krill Oil and all other supplements.    Continue taking all other medications without change through the day before surgery.  Have nothing to eat or drink after midnight the night before surgery.  On the morning of surgery take only Toprol XL with a sip of water.
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
## 2017-03-31 ENCOUNTER — Other Ambulatory Visit: Payer: Self-pay

## 2017-03-31 ENCOUNTER — Ambulatory Visit (HOSPITAL_COMMUNITY)
Admission: RE | Admit: 2017-03-31 | Discharge: 2017-03-31 | Disposition: A | Discharge status: Home / Self Care | Payer: Medicare Other | Source: Ambulatory Visit | Attending: Thoracic Surgery (Cardiothoracic Vascular Surgery) | Admitting: Thoracic Surgery (Cardiothoracic Vascular Surgery)

## 2017-03-31 ENCOUNTER — Ambulatory Visit (HOSPITAL_BASED_OUTPATIENT_CLINIC_OR_DEPARTMENT_OTHER)
Admission: RE | Admit: 2017-03-31 | Discharge: 2017-03-31 | Disposition: A | Discharge status: Home / Self Care | Payer: Medicare Other | Source: Ambulatory Visit | Attending: Thoracic Surgery (Cardiothoracic Vascular Surgery) | Admitting: Thoracic Surgery (Cardiothoracic Vascular Surgery)

## 2017-03-31 ENCOUNTER — Encounter (HOSPITAL_COMMUNITY)
Admission: RE | Admit: 2017-03-31 | Discharge: 2017-03-31 | Disposition: A | Discharge status: Home / Self Care | Payer: Medicare Other | Source: Ambulatory Visit | Attending: Thoracic Surgery (Cardiothoracic Vascular Surgery) | Admitting: Thoracic Surgery (Cardiothoracic Vascular Surgery)

## 2017-03-31 ENCOUNTER — Encounter (HOSPITAL_COMMUNITY): Payer: Self-pay

## 2017-03-31 DIAGNOSIS — I340 Nonrheumatic mitral (valve) insufficiency: Secondary | ICD-10-CM

## 2017-03-31 DIAGNOSIS — I6523 Occlusion and stenosis of bilateral carotid arteries: Secondary | ICD-10-CM | POA: Insufficient documentation

## 2017-03-31 DIAGNOSIS — J90 Pleural effusion, not elsewhere classified: Secondary | ICD-10-CM | POA: Diagnosis not present

## 2017-03-31 DIAGNOSIS — I4891 Unspecified atrial fibrillation: Secondary | ICD-10-CM | POA: Insufficient documentation

## 2017-03-31 DIAGNOSIS — J9811 Atelectasis: Secondary | ICD-10-CM | POA: Insufficient documentation

## 2017-03-31 DIAGNOSIS — Z01818 Encounter for other preprocedural examination: Secondary | ICD-10-CM | POA: Diagnosis not present

## 2017-03-31 DIAGNOSIS — I700 Atherosclerosis of aorta: Secondary | ICD-10-CM | POA: Insufficient documentation

## 2017-03-31 HISTORY — DX: Presence of spectacles and contact lenses: Z97.3

## 2017-03-31 LAB — PROTIME-INR
INR: 0.96
Prothrombin Time: 12.7 seconds (ref 11.4–15.2)

## 2017-03-31 LAB — BLOOD GAS, ARTERIAL
ACID-BASE EXCESS: 2.2 mmol/L — AB (ref 0.0–2.0)
Bicarbonate: 26.3 mmol/L (ref 20.0–28.0)
DRAWN BY: 449841
FIO2: 21
O2 SAT: 97.5 %
Patient temperature: 98.6
pCO2 arterial: 41.5 mmHg (ref 32.0–48.0)
pH, Arterial: 7.417 (ref 7.350–7.450)
pO2, Arterial: 96.4 mmHg (ref 83.0–108.0)

## 2017-03-31 LAB — PULMONARY FUNCTION TEST
DL/VA % pred: 73 %
DL/VA: 3.1 ml/min/mmHg/L
DLCO COR % PRED: 50 %
DLCO UNC % PRED: 43 %
DLCO cor: 13.05 ml/min/mmHg
DLCO unc: 11.24 ml/min/mmHg
FEF 25-75 PRE: 1.44 L/s
FEF2575-%Pred-Pre: 85 %
FEV1-%Pred-Pre: 77 %
FEV1-Pre: 1.84 L
FEV1FVC-%PRED-PRE: 103 %
FEV6-%Pred-Pre: 79 %
FEV6-Pre: 2.47 L
FEV6FVC-%PRED-PRE: 107 %
FVC-%Pred-Pre: 73 %
FVC-Pre: 2.48 L
Pre FEV1/FVC ratio: 74 %
Pre FEV6/FVC Ratio: 100 %
RV % PRED: 163 %
RV: 3.8 L
TLC % pred: 100 %
TLC: 6.07 L

## 2017-03-31 LAB — URINALYSIS, ROUTINE W REFLEX MICROSCOPIC
Bilirubin Urine: NEGATIVE
Glucose, UA: NEGATIVE mg/dL
Ketones, ur: NEGATIVE mg/dL
LEUKOCYTES UA: NEGATIVE
Nitrite: NEGATIVE
Protein, ur: 100 mg/dL — AB
SPECIFIC GRAVITY, URINE: 1.017 (ref 1.005–1.030)
pH: 5 (ref 5.0–8.0)

## 2017-03-31 LAB — CBC
HCT: 30.8 % — ABNORMAL LOW (ref 39.0–52.0)
Hemoglobin: 10.5 g/dL — ABNORMAL LOW (ref 13.0–17.0)
MCH: 34.7 pg — ABNORMAL HIGH (ref 26.0–34.0)
MCHC: 34.1 g/dL (ref 30.0–36.0)
MCV: 101.7 fL — ABNORMAL HIGH (ref 78.0–100.0)
PLATELETS: 167 10*3/uL (ref 150–400)
RBC: 3.03 MIL/uL — AB (ref 4.22–5.81)
RDW: 13.6 % (ref 11.5–15.5)
WBC: 6.9 10*3/uL (ref 4.0–10.5)

## 2017-03-31 LAB — COMPREHENSIVE METABOLIC PANEL
ALBUMIN: 3.3 g/dL — AB (ref 3.5–5.0)
ALT: 20 U/L (ref 17–63)
AST: 28 U/L (ref 15–41)
Alkaline Phosphatase: 110 U/L (ref 38–126)
Anion gap: 13 (ref 5–15)
BUN: 11 mg/dL (ref 6–20)
CHLORIDE: 100 mmol/L — AB (ref 101–111)
CO2: 21 mmol/L — AB (ref 22–32)
CREATININE: 1.35 mg/dL — AB (ref 0.61–1.24)
Calcium: 8.8 mg/dL — ABNORMAL LOW (ref 8.9–10.3)
GFR calc non Af Amer: 49 mL/min — ABNORMAL LOW (ref >60)
GFR, EST AFRICAN AMERICAN: 57 mL/min — AB (ref >60)
GLUCOSE: 121 mg/dL — AB (ref 65–99)
Potassium: 4.2 mmol/L (ref 3.5–5.1)
SODIUM: 134 mmol/L — AB (ref 135–145)
Total Bilirubin: 0.5 mg/dL (ref 0.3–1.2)
Total Protein: 6 g/dL — ABNORMAL LOW (ref 6.5–8.1)

## 2017-03-31 LAB — SURGICAL PCR SCREEN
MRSA, PCR: NEGATIVE
Staphylococcus aureus: NEGATIVE

## 2017-03-31 LAB — GLUCOSE, CAPILLARY: Glucose-Capillary: 108 mg/dL — ABNORMAL HIGH (ref 65–99)

## 2017-03-31 LAB — APTT: APTT: 27 s (ref 24–36)

## 2017-03-31 LAB — ABO/RH: ABO/RH(D): B POS

## 2017-03-31 NOTE — Progress Notes (Signed)
PCP: Dr. Betty Martinique Cardiologist: Dr. [name]  EKG: Today CXR: Today ECHO: 2018 Stress Test: Cardiac Cath: Jan 2019 PFTs- Today Dopplers- Today  Fasting Blood Sugar- 100's  STOP BANG assessment tool routed to PCP.  Patient denies fever, cough, and chest pain at PAT appointment. Pt confirms SOB with rest/activity.  Patient verbalized understanding of instructions provided today at the PAT appointment.  Patient asked to review instructions at home and day of surgery.

## 2017-03-31 NOTE — Progress Notes (Signed)
Called and left message with Joanell Rising for abnormal labs.

## 2017-03-31 NOTE — Progress Notes (Signed)
Pre OP MVR Dopplers completed. 1% to 39% ICA stenosis bilaterally. Vertebral artery flow is antegrade bilaterally. Palmar arch evaluation is normal bilaterally. [facility], [place] 03/31/2017, 12:25 PM

## 2017-03-31 NOTE — Progress Notes (Signed)
   03/31/17 0827  OBSTRUCTIVE SLEEP APNEA  Have you ever been diagnosed with sleep apnea through a sleep study? No  Do you snore loudly (loud enough to be heard through closed doors)?  1  Do you often feel tired, fatigued, or sleepy during the daytime (such as falling asleep during driving or talking to someone)? 0  Has anyone observed you stop breathing during your sleep? 0  Do you have, or are you being treated for high blood pressure? 1  BMI more than 35 kg/m2? 0  Age > 50 (1-yes) 1  Neck circumference greater than:Male 16 inches or larger, Male 17inches or larger? 1 (42.5)  Male Gender (Yes=1) 1  Obstructive Sleep Apnea Score 5  Score 5 or greater  Results sent to PCP

## 2017-04-01 LAB — HEMOGLOBIN A1C
HEMOGLOBIN A1C: 6.3 % — AB (ref 4.8–5.6)
MEAN PLASMA GLUCOSE: 134 mg/dL

## 2017-04-01 MED ORDER — VANCOMYCIN HCL 10 G IV SOLR
1500.0000 mg | INTRAVENOUS | Status: AC
  Administered 2017-04-02: 1500 mg via INTRAVENOUS
  Filled 2017-04-01: qty 1500

## 2017-04-01 MED ORDER — KENNESTONE BLOOD CARDIOPLEGIA VIAL
13.0000 mL | Freq: Once | Status: DC
  Filled 2017-04-01: qty 13

## 2017-04-01 MED ORDER — SODIUM CHLORIDE 0.9 % IV SOLN
INTRAVENOUS | Status: DC
  Filled 2017-04-01: qty 30

## 2017-04-01 MED ORDER — KENNESTONE BLOOD CARDIOPLEGIA (KBC) MANNITOL SYRINGE (20%, 32ML)
32.0000 mL | Freq: Once | INTRAVENOUS | Status: DC
  Filled 2017-04-01: qty 32

## 2017-04-01 MED ORDER — METOPROLOL TARTRATE 12.5 MG HALF TABLET: 12.5000 mg | ORAL_TABLET | ORAL | Status: DC

## 2017-04-01 MED ORDER — PLASMA-LYTE 148 IV SOLN
INTRAVENOUS | Status: DC
  Filled 2017-04-01: qty 2.5

## 2017-04-01 MED ORDER — CHLORHEXIDINE GLUCONATE 0.12 % MT SOLN
15.0000 mL | Freq: Once | OROMUCOSAL | Status: AC
  Administered 2017-04-02: 15 mL via OROMUCOSAL
  Filled 2017-04-01: qty 15

## 2017-04-01 MED ORDER — LEVOFLOXACIN IN D5W 500 MG/100ML IV SOLN
500.0000 mg | INTRAVENOUS | Status: AC
  Administered 2017-04-02: 500 mg via INTRAVENOUS
  Filled 2017-04-01: qty 100

## 2017-04-01 MED ORDER — TRANEXAMIC ACID 1000 MG/10ML IV SOLN
1.5000 mg/kg/h | INTRAVENOUS | Status: AC
  Administered 2017-04-02: 1.5 mg/kg/h via INTRAVENOUS
  Filled 2017-04-01: qty 25

## 2017-04-01 MED ORDER — SODIUM CHLORIDE 0.9 % IV SOLN
INTRAVENOUS | Status: DC
  Filled 2017-04-01: qty 1

## 2017-04-01 MED ORDER — POTASSIUM CHLORIDE 2 MEQ/ML IV SOLN
80.0000 meq | INTRAVENOUS | Status: DC
  Filled 2017-04-01: qty 40

## 2017-04-01 MED ORDER — MAGNESIUM SULFATE 50 % IJ SOLN
40.0000 meq | INTRAMUSCULAR | Status: DC
  Filled 2017-04-01: qty 9.85

## 2017-04-01 MED ORDER — TRANEXAMIC ACID (OHS) PUMP PRIME SOLUTION
2.0000 mg/kg | INTRAVENOUS | Status: DC
Start: 2017-04-02 — End: 2017-04-02
  Filled 2017-04-01: qty 1.7

## 2017-04-01 MED ORDER — GLUTARALDEHYDE 0.625% SOAKING SOLUTION
TOPICAL | Status: DC
  Filled 2017-04-01: qty 50

## 2017-04-01 MED ORDER — DEXMEDETOMIDINE HCL IN NACL 400 MCG/100ML IV SOLN
0.1000 ug/kg/h | INTRAVENOUS | Status: DC
  Filled 2017-04-01: qty 100

## 2017-04-01 MED ORDER — DOPAMINE-DEXTROSE 3.2-5 MG/ML-% IV SOLN
0.0000 ug/kg/min | INTRAVENOUS | Status: DC
  Filled 2017-04-01: qty 250

## 2017-04-01 MED ORDER — NITROGLYCERIN IN D5W 200-5 MCG/ML-% IV SOLN
2.0000 ug/min | INTRAVENOUS | Status: DC
  Filled 2017-04-01: qty 250

## 2017-04-01 MED ORDER — VANCOMYCIN HCL 1000 MG IV SOLR
INTRAVENOUS | Status: AC
  Administered 2017-04-02: 1000 mL
  Filled 2017-04-01: qty 1000

## 2017-04-01 MED ORDER — TRANEXAMIC ACID (OHS) BOLUS VIA INFUSION
15.0000 mg/kg | INTRAVENOUS | Status: AC
  Administered 2017-04-02: 1273.5 mg via INTRAVENOUS
  Filled 2017-04-01: qty 1274

## 2017-04-01 MED ORDER — MILRINONE LACTATE IN DEXTROSE 20-5 MG/100ML-% IV SOLN
0.1250 ug/kg/min | INTRAVENOUS | Status: DC
  Filled 2017-04-01: qty 100

## 2017-04-01 MED ORDER — SODIUM CHLORIDE 0.9 % IV SOLN
30.0000 ug/min | INTRAVENOUS | Status: DC
  Filled 2017-04-01: qty 2

## 2017-04-01 MED ORDER — EPINEPHRINE PF 1 MG/ML IJ SOLN
0.0000 ug/min | INTRAVENOUS | Status: DC
  Filled 2017-04-01: qty 4

## 2017-04-01 NOTE — H&P (Signed)
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 CAD 2. Normal LV function 3. Mild pulmonary HTN 4. Elevated LV filling pressures with large V wave on PCWP tracing to 42 mm Hg 5. Normal cardiac output.  Plan: Consider for MV repair.    Indications   Severe mitral insufficiency [I34.0 (ICD-10-CM)]  Procedural Details/Technique   Technical Details Indication: 77 yo WM with severe mitral insufficiency.  Procedural Details: The right wrist was prepped, draped, and anesthetized with 1% lidocaine. Using the modified Seldinger technique a 6 Fr slender sheath was placed in the right radial artery and a 5 French sheath was placed in the right brachial vein. A Swan-Ganz catheter was used for the right heart catheterization. Standard protocol was followed for recording of right heart pressures and sampling of oxygen saturations. Fick cardiac output was calculated. Standard Judkins catheters were used for selective coronary angiography and left ventriculography. There were no immediate procedural complications.  The patient was transferred to the post catheterization recovery area for further monitoring.  Contrast: 60 cc   Estimated blood loss <50 mL.  During this procedure the patient was administered the following to achieve and maintain moderate conscious sedation: Versed 1 mg, Fentanyl 25 mcg, while the patient's heart rate, blood pressure, and oxygen saturation were continuously monitored. The period of conscious sedation was 29 minutes, of which I was present face-to-face 100% of this time.  Complications   Complications documented before study signed (02/13/2017 8:31 AM EST)    No complications were associated with this study.  Documented by Martin[place], Peter M, MD - 02/13/2017 8:29 AM EST    Coronary Findings   Diagnostic  Dominance: Right  Left Main  Vessel was injected. Vessel is normal in caliber. Vessel is angiographically normal.  Left Anterior Descending  Vessel was injected. Vessel is normal in caliber. There is mild diffuse disease throughout the vessel.  Prox LAD lesion 20% stenosed  Prox LAD lesion is 20% stenosed.  Left Circumflex  Vessel was injected. Vessel is normal in caliber. Vessel is angiographically normal.  Right Coronary Artery  Prox RCA lesion 25% stenosed  Prox RCA lesion is 25% stenosed.  Intervention   No interventions have been documented.  Right Heart   Right Heart Pressures Hemodynamic findings consistent with mild pulmonary hypertension. Elevated LV EDP consistent with volume overload.  Wall Motion                  Left Heart   Left Ventricle The left ventricular size is normal. The left ventricular systolic function is normal. LV end diastolic pressure is normal. The left ventricular ejection fraction is greater than 65% by visual estimate. No regional wall motion abnormalities.  Mitral Valve There is severe (4+) mitral regurgitation.  Coronary Diagrams   Diagnostic Diagram       Implants        No implant documentation  for this case.  MERGE Images   Show images for CARDIAC CATHETERIZATION   Link to Procedure Log   Procedure Log    Hemo Data    Most Recent Value  Fick Cardiac Output 4.8 L/min  Fick Cardiac Output Index 2.57 (  L/min)/BSA  RA A Wave 10 mmHg  RA V Wave 10 mmHg  RA Mean 9 mmHg  RV Systolic Pressure 44 mmHg  RV Diastolic Pressure 3 mmHg  RV EDP 9 mmHg  PA Systolic Pressure 50 mmHg  PA Diastolic Pressure 22 mmHg  PA Mean 33 mmHg  PW A Wave 23 mmHg  PW V Wave 40 mmHg  PW Mean 24 mmHg  AO Systolic Pressure 732 mmHg  AO Diastolic Pressure 70 mmHg  AO Mean 95 mmHg  LV Systolic Pressure 202 mmHg  LV Diastolic Pressure 11 mmHg  LV EDP 16 mmHg  Arterial Occlusion Pressure Extended Systolic Pressure 542 mmHg  Arterial Occlusion Pressure Extended Diastolic Pressure 68 mmHg  Arterial Occlusion Pressure Extended Mean Pressure 94 mmHg  Left Ventricular Apex Extended Systolic Pressure 706 mmHg  Left Ventricular Apex Extended Diastolic Pressure 11 mmHg  Left Ventricular Apex Extended EDP Pressure 17 mmHg  QP/QS 1  TPVR Index 12.86 HRUI  TSVR Index 37.04 HRUI  PVR SVR Ratio 0.1  TPVR/TSVR Ratio 0.35   CT ANGIOGRAPHY CHEST, ABDOMEN AND PELVIS  TECHNIQUE: Multidetector CT imaging through the chest, abdomen and pelvis was performed using the standard protocol during bolus administration of intravenous contrast. Multiplanar reconstructed images and MIPs were obtained and reviewed to evaluate the vascular anatomy.  CONTRAST: 41mL ISOVUE-370 IOPAMIDOL (ISOVUE-370) INJECTION 76%  COMPARISON: None.  FINDINGS: CTA CHEST FINDINGS  Cardiovascular: Coronary artery calcifications involving the LAD and right coronary artery. Heart size is normal with a small amount of pericardial fluid. Normal caliber of the main pulmonary arteries. Atherosclerotic disease at the aortic arch. Normal caliber of the thoracic aorta without dissection. There is a bovine arch.  Mild atherosclerotic disease involving the great vessels without significant stenosis. Bilateral vertebral arteries are patent, the left vertebral artery appears to be dominant. Small amount of mural thrombus along the medial descending thoracic aorta but no definite intramural hematoma or dissection in this area. Prominent posterior intercostal arteries bilaterally.  Mediastinum/Nodes: Some calcifications in the anterior mediastinum. Subcarinal tissue measures 1.4 cm. Mildly prominent mediastinal lymph nodes. Overall, no suspicious chest lymphadenopathy. Mild dilatation of the mid and proximal esophagus containing some fluid.  Lungs/Pleura: Trachea and mainstem bronchi are patent. Small to moderate sized bilateral pleural effusions. Pleural fluid may be slightly complex. Bibasilar chest densities are suggestive for atelectasis. Centrilobular emphysema in the upper lobes. Pleural-based reticular densities and the haziness, particularly in the left upper chest, are nonspecific but could be related to scarring or atelectasis.  Musculoskeletal: Deformity in the left scapula compatible with old fracture. Probable old fracture involving the lateral left clavicle. Old left rib fractures.  Review of the MIP images confirms the above findings.  CTA ABDOMEN AND PELVIS FINDINGS  VASCULAR  Aorta: Atherosclerotic disease in the abdominal aorta without aneurysm. Irregular mural thrombus at the 8 o'clock position on sequence 8, image 177 is suggestive for ulcerative plaque.  Celiac: Patent without evidence of aneurysm, dissection, vasculitis or significant stenosis.  SMA: Patent without evidence of aneurysm, dissection, vasculitis or significant stenosis.  Renals: There are 2 renal arteries bilaterally. Calcified plaque at the origin of the main right renal artery with mild stenosis. Early branch vessel involving the main right renal artery is patent. Accessory right renal  artery comes off the anterior aspect of the abdominal aorta. Mild stenosis at the origin of the main left renal artery related to calcified plaque. Accessory left renal artery is just above and anterior to the main left renal artery. No  evidence for aneurysm or FMD involving the renal arteries.  IMA: Inferior mesenteric artery is patent.  Inflow: Atherosclerotic calcifications involving the bilateral iliac arteries without aneurysm or significant stenosis. However, there is significant calcified plaque involving the right common femoral artery causing nearly 50% narrowing of the lumen on sequence 8, image 285. Proximal right femoral arteries are patent. There is calcified plaque involving the left common femoral artery but there is no significant stenosis due to the left common femoral artery plaque. Proximal left femoral arteries are patent.  Veins: No obvious venous abnormality within the limitations of this arterial phase study.  NON VASCULAR  Hepatobiliary: Liver has a lobulated appearance but not clearly nodular. No focal liver abnormality. No biliary dilatation. The gallbladder is within normal limits.  Pancreas: Normal appearance of the pancreas without inflammation or duct dilatation.  Spleen: Normal appearance of spleen without enlargement.  Adrenals/Urinary Tract: Fullness of the left adrenal gland may be related to hyperplasia. Right adrenal gland is unremarkable. Probable cyst near the upper pole of the left kidney. There may be another small cyst in the medial left kidney. Perinephric edema bilaterally. No hydronephrosis. Contrast in the urinary bladder. Urinary bladder is nondistended.  Stomach/Bowel: Diverticulosis involving the proximal sigmoid colon and descending colon. No acute bowel inflammation. Normal appendix. No evidence for bowel obstruction. Stomach is unremarkable.  Lymphatic: No significant lymph node enlargement in the abdomen  or pelvis.  Reproductive: Prostate is unremarkable.  Other: Mild mesenteric edema in the abdomen and pelvis. No free fluid in the abdomen or pelvis. No free intraperitoneal air.  Musculoskeletal: Multilevel disc space narrowing in lumbar spine most prominent at L5-S1.  Review of the MIP images confirms the above findings.  IMPRESSION: Vascular:  Atherosclerotic disease throughout the chest, abdomen and pelvis without aortic aneurysm or dissection. There is some irregular mural thrombus in the abdominal aorta suggestive for ulcerative plaque.  Focal calcified plaque in the right common femoral artery causing approximately 50% stenosis.  Coronary artery calcifications.  Aortic Atherosclerosis (ICD10-I70.0).  Nonvascular:  Small to moderate bilateral pleural effusions. The pleural fluid may be mildly complex.  Emphysema (ICD10-J43.9).  Colonic diverticulosis.  Old chest trauma.   Electronically Signed By: Markus Daft [name].D. On: 03/21/2017 09:35    Impression:  Patient has stage D severe symptomatic primary mitral regurgitation with recurrent persistent atrial fibrillation that has failed DC cardioversion. He presents with worsening symptoms of exertional shortness of breath and fatigue consistent with chronic diastolic congestive heart failure, [place] Heart Association functional class III. I have personally reviewed the patient's recent transesophageal echocardiogram and diagnostic cardiac catheterization. TEE revealed severe mitral regurgitation. The posterior leaflet may be somewhat restricted and/or hypoplastic. The anterior leaflet overrides the posterior leaflet and there may be some chordal elongation or even perhaps 1 or 2 ruptured primary chordae tendinaein the middle of the A2 segment of the anterior leaflet. There is severe mitral regurgitation directed posteriorly around the entire left atrium. There is left atrial enlargement.  Left ventricular size and systolic function appears normal. Diagnostic cardiac catheterization is notable for the absence of significant coronary artery disease and confirmed the presence of severe mitral regurgitation. There was mild pulmonary hypertension. I agree the patient needs elective mitral valve repair. He would likely benefit from concomitant Maze procedure. He appears to be reasonably good candidate for minimally invasive approach for surgery.   Plan:  The patient and his wife were again counseled at length regarding the indications, risks and potential benefits of mitral valve  repair.  The rationale for elective surgery has been explained, including a comparison between surgery and continued medical therapy with close follow-up.  The likelihood of successful and durable valve repair has been discussed with particular reference to the findings of their recent echocardiogram.  Based upon these findings and previous experience, I have quoted them a greater than 90 percent likelihood of successful valve repair.  In the unlikely event that their valve cannot be successfully repaired, we discussed the possibility of replacing the mitral valve using a mechanical prosthesis with the attendant need for long-term anticoagulation versus the alternative of replacing it using a bioprosthetic tissue valve with its potential for late structural valve deterioration and failure, depending upon the patient's longevity.  The patient specifically requests that if the mitral valve must be replaced that it be done using a bioprosthetic tissue valve.   Alternative surgical approaches have been discussed including a comparison between conventional sternotomy and minimally-invasive techniques.  The relative risks and benefits of each have been reviewed as they pertain to the patient's specific circumstances, and all of their questions have been addressed.  The patient understands and accepts all potential risks of  surgery including but not limited to risk of death, stroke or other neurologic complication, myocardial infarction, congestive heart failure, respiratory failure, renal failure, bleeding requiring transfusion and/or reexploration, arrhythmia, infection or other wound complications, pneumonia, pleural and/or pericardial effusion, pulmonary embolus, aortic dissection or other major vascular complication, or delayed complications related to valve repair or replacement including but not limited to structural valve deterioration and failure, thrombosis, embolization, endocarditis, or paravalvular leak.  Specific risks potentially related to the minimally-invasive approach were discussed at length, including but not limited to risk of conversion to full or partial sternotomy, aortic dissection or other major vascular complication, unilateral acute lung injury or pulmonary edema, phrenic nerve dysfunction or paralysis, rib fracture, chronic pain, lung hernia, or lymphocele.  All of their questions have been answered.    Valentina Gu. Roxy Manns, MD 03/30/2017 10:12 AM

## 2017-04-02 ENCOUNTER — Other Ambulatory Visit: Payer: Self-pay

## 2017-04-02 ENCOUNTER — Inpatient Hospital Stay (HOSPITAL_COMMUNITY): Payer: Medicare Other | Admitting: Anesthesiology

## 2017-04-02 ENCOUNTER — Ambulatory Visit (HOSPITAL_COMMUNITY)
Admission: RE | Admit: 2017-04-02 | Discharge: 2017-04-02 | Disposition: A | Discharge status: Home / Self Care | Payer: Medicare Other | Source: Ambulatory Visit | Attending: Thoracic Surgery (Cardiothoracic Vascular Surgery) | Admitting: Thoracic Surgery (Cardiothoracic Vascular Surgery)

## 2017-04-02 ENCOUNTER — Encounter (HOSPITAL_COMMUNITY): Payer: Self-pay | Admitting: Thoracic Surgery (Cardiothoracic Vascular Surgery)

## 2017-04-02 ENCOUNTER — Inpatient Hospital Stay (HOSPITAL_COMMUNITY)
Admission: RE | Admit: 2017-04-02 | Discharge: 2017-04-09 | DRG: 220 | Disposition: A | Discharge status: Home Health | Payer: Medicare Other | Source: Ambulatory Visit | Attending: Thoracic Surgery (Cardiothoracic Vascular Surgery) | Admitting: Thoracic Surgery (Cardiothoracic Vascular Surgery)

## 2017-04-02 ENCOUNTER — Encounter (HOSPITAL_COMMUNITY)
Admission: RE | Disposition: A | Discharge status: Home Health | Payer: Self-pay | Source: Ambulatory Visit | Attending: Thoracic Surgery (Cardiothoracic Vascular Surgery)

## 2017-04-02 ENCOUNTER — Inpatient Hospital Stay (HOSPITAL_COMMUNITY): Payer: Medicare Other

## 2017-04-02 DIAGNOSIS — N1832 Chronic kidney disease, stage 3b: Secondary | ICD-10-CM | POA: Diagnosis present

## 2017-04-02 DIAGNOSIS — Z87891 Personal history of nicotine dependence: Secondary | ICD-10-CM

## 2017-04-02 DIAGNOSIS — I340 Nonrheumatic mitral (valve) insufficiency: Secondary | ICD-10-CM | POA: Diagnosis present

## 2017-04-02 DIAGNOSIS — M549 Dorsalgia, unspecified: Secondary | ICD-10-CM | POA: Diagnosis present

## 2017-04-02 DIAGNOSIS — N183 Chronic kidney disease, stage 3 (moderate): Secondary | ICD-10-CM | POA: Diagnosis present

## 2017-04-02 DIAGNOSIS — Z888 Allergy status to other drugs, medicaments and biological substances status: Secondary | ICD-10-CM

## 2017-04-02 DIAGNOSIS — Z91048 Other nonmedicinal substance allergy status: Secondary | ICD-10-CM

## 2017-04-02 DIAGNOSIS — I272 Pulmonary hypertension, unspecified: Secondary | ICD-10-CM | POA: Diagnosis present

## 2017-04-02 DIAGNOSIS — Z79899 Other long term (current) drug therapy: Secondary | ICD-10-CM

## 2017-04-02 DIAGNOSIS — I442 Atrioventricular block, complete: Secondary | ICD-10-CM | POA: Diagnosis not present

## 2017-04-02 DIAGNOSIS — J90 Pleural effusion, not elsewhere classified: Secondary | ICD-10-CM | POA: Diagnosis present

## 2017-04-02 DIAGNOSIS — Z7901 Long term (current) use of anticoagulants: Secondary | ICD-10-CM

## 2017-04-02 DIAGNOSIS — E7800 Pure hypercholesterolemia, unspecified: Secondary | ICD-10-CM | POA: Diagnosis present

## 2017-04-02 DIAGNOSIS — Q211 Atrial septal defect: Secondary | ICD-10-CM | POA: Diagnosis not present

## 2017-04-02 DIAGNOSIS — F0390 Unspecified dementia without behavioral disturbance: Secondary | ICD-10-CM | POA: Diagnosis present

## 2017-04-02 DIAGNOSIS — D696 Thrombocytopenia, unspecified: Secondary | ICD-10-CM | POA: Diagnosis not present

## 2017-04-02 DIAGNOSIS — I130 Hypertensive heart and chronic kidney disease with heart failure and stage 1 through stage 4 chronic kidney disease, or unspecified chronic kidney disease: Secondary | ICD-10-CM | POA: Diagnosis present

## 2017-04-02 DIAGNOSIS — I2510 Atherosclerotic heart disease of native coronary artery without angina pectoris: Secondary | ICD-10-CM | POA: Diagnosis present

## 2017-04-02 DIAGNOSIS — I081 Rheumatic disorders of both mitral and tricuspid valves: Secondary | ICD-10-CM | POA: Diagnosis present

## 2017-04-02 DIAGNOSIS — J9811 Atelectasis: Secondary | ICD-10-CM | POA: Diagnosis not present

## 2017-04-02 DIAGNOSIS — Z91013 Allergy to seafood: Secondary | ICD-10-CM

## 2017-04-02 DIAGNOSIS — I4891 Unspecified atrial fibrillation: Secondary | ICD-10-CM | POA: Diagnosis present

## 2017-04-02 DIAGNOSIS — I5032 Chronic diastolic (congestive) heart failure: Secondary | ICD-10-CM | POA: Diagnosis present

## 2017-04-02 DIAGNOSIS — I509 Heart failure, unspecified: Secondary | ICD-10-CM

## 2017-04-02 DIAGNOSIS — D62 Acute posthemorrhagic anemia: Secondary | ICD-10-CM | POA: Diagnosis not present

## 2017-04-02 DIAGNOSIS — I481 Persistent atrial fibrillation: Secondary | ICD-10-CM | POA: Diagnosis present

## 2017-04-02 DIAGNOSIS — Z880 Allergy status to penicillin: Secondary | ICD-10-CM

## 2017-04-02 DIAGNOSIS — E1121 Type 2 diabetes mellitus with diabetic nephropathy: Secondary | ICD-10-CM | POA: Diagnosis present

## 2017-04-02 DIAGNOSIS — M25511 Pain in right shoulder: Secondary | ICD-10-CM | POA: Diagnosis present

## 2017-04-02 DIAGNOSIS — K219 Gastro-esophageal reflux disease without esophagitis: Secondary | ICD-10-CM | POA: Diagnosis present

## 2017-04-02 DIAGNOSIS — E1122 Type 2 diabetes mellitus with diabetic chronic kidney disease: Secondary | ICD-10-CM | POA: Diagnosis present

## 2017-04-02 DIAGNOSIS — Z7984 Long term (current) use of oral hypoglycemic drugs: Secondary | ICD-10-CM

## 2017-04-02 DIAGNOSIS — F419 Anxiety disorder, unspecified: Secondary | ICD-10-CM | POA: Diagnosis present

## 2017-04-02 DIAGNOSIS — Z09 Encounter for follow-up examination after completed treatment for conditions other than malignant neoplasm: Secondary | ICD-10-CM

## 2017-04-02 DIAGNOSIS — I10 Essential (primary) hypertension: Secondary | ICD-10-CM | POA: Diagnosis present

## 2017-04-02 DIAGNOSIS — E1169 Type 2 diabetes mellitus with other specified complication: Secondary | ICD-10-CM | POA: Diagnosis present

## 2017-04-02 DIAGNOSIS — E785 Hyperlipidemia, unspecified: Secondary | ICD-10-CM | POA: Diagnosis present

## 2017-04-02 DIAGNOSIS — Z8679 Personal history of other diseases of the circulatory system: Secondary | ICD-10-CM

## 2017-04-02 DIAGNOSIS — Z9889 Other specified postprocedural states: Secondary | ICD-10-CM

## 2017-04-02 DIAGNOSIS — I059 Rheumatic mitral valve disease, unspecified: Secondary | ICD-10-CM

## 2017-04-02 HISTORY — DX: Personal history of other diseases of the circulatory system: Z86.79

## 2017-04-02 HISTORY — PX: TEE WITHOUT CARDIOVERSION: SHX5443

## 2017-04-02 HISTORY — PX: CLIPPING OF ATRIAL APPENDAGE: SHX5773

## 2017-04-02 HISTORY — PX: PATENT FORAMEN OVALE(PFO) CLOSURE: CATH118300

## 2017-04-02 HISTORY — DX: Other specified postprocedural states: Z98.890

## 2017-04-02 HISTORY — PX: MINIMALLY INVASIVE MAZE PROCEDURE: SHX6244

## 2017-04-02 HISTORY — PX: MITRAL VALVE REPAIR: SHX2039

## 2017-04-02 HISTORY — DX: Type 2 diabetes mellitus with diabetic nephropathy: E11.21

## 2017-04-02 LAB — APTT: aPTT: 36 seconds (ref 24–36)

## 2017-04-02 LAB — POCT I-STAT, CHEM 8
BUN: 12 mg/dL (ref 6–20)
BUN: 11 mg/dL (ref 6–20)
BUN: 12 mg/dL (ref 6–20)
BUN: 13 mg/dL (ref 6–20)
BUN: 13 mg/dL (ref 6–20)
BUN: 15 mg/dL (ref 6–20)
BUN: 14 mg/dL (ref 6–20)
BUN: 15 mg/dL (ref 6–20)
CALCIUM ION: 1.13 mmol/L — AB (ref 1.15–1.40)
CALCIUM ION: 1 mmol/L — AB (ref 1.15–1.40)
CALCIUM ION: 1.09 mmol/L — AB (ref 1.15–1.40)
CALCIUM ION: 1.05 mmol/L — AB (ref 1.15–1.40)
CALCIUM ION: 1.03 mmol/L — AB (ref 1.15–1.40)
CHLORIDE: 97 mmol/L — AB (ref 101–111)
CHLORIDE: 95 mmol/L — AB (ref 101–111)
CHLORIDE: 99 mmol/L — AB (ref 101–111)
CREATININE: 1.3 mg/dL — AB (ref 0.61–1.24)
CREATININE: 1.3 mg/dL — AB (ref 0.61–1.24)
Calcium, Ion: 1.03 mmol/L — ABNORMAL LOW (ref 1.15–1.40)
Calcium, Ion: 1.02 mmol/L — ABNORMAL LOW (ref 1.15–1.40)
Calcium, Ion: 1.02 mmol/L — ABNORMAL LOW (ref 1.15–1.40)
Chloride: 98 mmol/L — ABNORMAL LOW (ref 101–111)
Chloride: 94 mmol/L — ABNORMAL LOW (ref 101–111)
Chloride: 96 mmol/L — ABNORMAL LOW (ref 101–111)
Chloride: 98 mmol/L — ABNORMAL LOW (ref 101–111)
Chloride: 99 mmol/L — ABNORMAL LOW (ref 101–111)
Creatinine, Ser: 1.3 mg/dL — ABNORMAL HIGH (ref 0.61–1.24)
Creatinine, Ser: 1.2 mg/dL (ref 0.61–1.24)
Creatinine, Ser: 1.3 mg/dL — ABNORMAL HIGH (ref 0.61–1.24)
Creatinine, Ser: 1.2 mg/dL (ref 0.61–1.24)
Creatinine, Ser: 1.1 mg/dL (ref 0.61–1.24)
Creatinine, Ser: 1.2 mg/dL (ref 0.61–1.24)
GLUCOSE: 103 mg/dL — AB (ref 65–99)
GLUCOSE: 104 mg/dL — AB (ref 65–99)
Glucose, Bld: 95 mg/dL (ref 65–99)
Glucose, Bld: 106 mg/dL — ABNORMAL HIGH (ref 65–99)
Glucose, Bld: 112 mg/dL — ABNORMAL HIGH (ref 65–99)
Glucose, Bld: 147 mg/dL — ABNORMAL HIGH (ref 65–99)
Glucose, Bld: 99 mg/dL (ref 65–99)
Glucose, Bld: 122 mg/dL — ABNORMAL HIGH (ref 65–99)
HCT: 30 % — ABNORMAL LOW (ref 39.0–52.0)
HCT: 27 % — ABNORMAL LOW (ref 39.0–52.0)
HCT: 29 % — ABNORMAL LOW (ref 39.0–52.0)
HEMATOCRIT: 24 % — AB (ref 39.0–52.0)
HEMATOCRIT: 20 % — AB (ref 39.0–52.0)
HEMATOCRIT: 23 % — AB (ref 39.0–52.0)
HEMATOCRIT: 25 % — AB (ref 39.0–52.0)
HEMATOCRIT: 30 % — AB (ref 39.0–52.0)
HEMOGLOBIN: 10.2 g/dL — AB (ref 13.0–17.0)
HEMOGLOBIN: 8.2 g/dL — AB (ref 13.0–17.0)
HEMOGLOBIN: 9.2 g/dL — AB (ref 13.0–17.0)
HEMOGLOBIN: 7.8 g/dL — AB (ref 13.0–17.0)
HEMOGLOBIN: 10.2 g/dL — AB (ref 13.0–17.0)
HEMOGLOBIN: 9.9 g/dL — AB (ref 13.0–17.0)
Hemoglobin: 6.8 g/dL — CL (ref 13.0–17.0)
Hemoglobin: 8.5 g/dL — ABNORMAL LOW (ref 13.0–17.0)
POTASSIUM: 5.3 mmol/L — AB (ref 3.5–5.1)
POTASSIUM: 5.1 mmol/L (ref 3.5–5.1)
POTASSIUM: 4.6 mmol/L (ref 3.5–5.1)
Potassium: 3.9 mmol/L (ref 3.5–5.1)
Potassium: 4 mmol/L (ref 3.5–5.1)
Potassium: 4.1 mmol/L (ref 3.5–5.1)
Potassium: 4.9 mmol/L (ref 3.5–5.1)
Potassium: 4.7 mmol/L (ref 3.5–5.1)
SODIUM: 134 mmol/L — AB (ref 135–145)
SODIUM: 129 mmol/L — AB (ref 135–145)
SODIUM: 131 mmol/L — AB (ref 135–145)
Sodium: 133 mmol/L — ABNORMAL LOW (ref 135–145)
Sodium: 133 mmol/L — ABNORMAL LOW (ref 135–145)
Sodium: 133 mmol/L — ABNORMAL LOW (ref 135–145)
Sodium: 135 mmol/L (ref 135–145)
Sodium: 135 mmol/L (ref 135–145)
TCO2: 25 mmol/L (ref 22–32)
TCO2: 27 mmol/L (ref 22–32)
TCO2: 29 mmol/L (ref 22–32)
TCO2: 28 mmol/L (ref 22–32)
TCO2: 28 mmol/L (ref 22–32)
TCO2: 26 mmol/L (ref 22–32)
TCO2: 24 mmol/L (ref 22–32)
TCO2: 24 mmol/L (ref 22–32)

## 2017-04-02 LAB — CBC
HCT: 29.7 % — ABNORMAL LOW (ref 39.0–52.0)
HEMATOCRIT: 30 % — AB (ref 39.0–52.0)
HEMOGLOBIN: 10.1 g/dL — AB (ref 13.0–17.0)
Hemoglobin: 10 g/dL — ABNORMAL LOW (ref 13.0–17.0)
MCH: 34.4 pg — AB (ref 26.0–34.0)
MCH: 33.7 pg (ref 26.0–34.0)
MCHC: 33.7 g/dL (ref 30.0–36.0)
MCHC: 33.7 g/dL (ref 30.0–36.0)
MCV: 102.1 fL — ABNORMAL HIGH (ref 78.0–100.0)
MCV: 100 fL (ref 78.0–100.0)
PLATELETS: 132 10*3/uL — AB (ref 150–400)
Platelets: 120 10*3/uL — ABNORMAL LOW (ref 150–400)
RBC: 2.91 MIL/uL — AB (ref 4.22–5.81)
RBC: 3 MIL/uL — AB (ref 4.22–5.81)
RDW: 13.5 % (ref 11.5–15.5)
RDW: 13 % (ref 11.5–15.5)
WBC: 9.5 10*3/uL (ref 4.0–10.5)
WBC: 13 10*3/uL — ABNORMAL HIGH (ref 4.0–10.5)

## 2017-04-02 LAB — CREATININE, SERUM
CREATININE: 1.53 mg/dL — AB (ref 0.61–1.24)
GFR calc Af Amer: 49 mL/min — ABNORMAL LOW (ref >60)
GFR calc non Af Amer: 42 mL/min — ABNORMAL LOW (ref >60)

## 2017-04-02 LAB — POCT I-STAT 3, ART BLOOD GAS (G3+)
ACID-BASE DEFICIT: 3 mmol/L — AB (ref 0.0–2.0)
Acid-Base Excess: 1 mmol/L (ref 0.0–2.0)
Acid-base deficit: 2 mmol/L (ref 0.0–2.0)
Acid-base deficit: 5 mmol/L — ABNORMAL HIGH (ref 0.0–2.0)
Acid-base deficit: 1 mmol/L (ref 0.0–2.0)
BICARBONATE: 24.2 mmol/L (ref 20.0–28.0)
BICARBONATE: 20.3 mmol/L (ref 20.0–28.0)
BICARBONATE: 25.8 mmol/L (ref 20.0–28.0)
Bicarbonate: 24.9 mmol/L (ref 20.0–28.0)
Bicarbonate: 24.5 mmol/L (ref 20.0–28.0)
Bicarbonate: 22.5 mmol/L (ref 20.0–28.0)
O2 SAT: 100 %
O2 SAT: 95 %
O2 SAT: 96 %
O2 Saturation: 94 %
O2 Saturation: 95 %
O2 Saturation: 97 %
PCO2 ART: 48.8 mmHg — AB (ref 32.0–48.0)
PCO2 ART: 42.6 mmHg (ref 32.0–48.0)
PCO2 ART: 49 mmHg — AB (ref 32.0–48.0)
PH ART: 7.303 — AB (ref 7.350–7.450)
PH ART: 7.362 (ref 7.350–7.450)
PH ART: 7.33 — AB (ref 7.350–7.450)
PO2 ART: 80 mmHg — AB (ref 83.0–108.0)
PO2 ART: 74 mmHg — AB (ref 83.0–108.0)
PO2 ART: 75 mmHg — AB (ref 83.0–108.0)
Patient temperature: 35.9
Patient temperature: 37
Patient temperature: 37.1
TCO2: 26 mmol/L (ref 22–32)
TCO2: 26 mmol/L (ref 22–32)
TCO2: 21 mmol/L — AB (ref 22–32)
TCO2: 26 mmol/L (ref 22–32)
TCO2: 24 mmol/L (ref 22–32)
TCO2: 27 mmol/L (ref 22–32)
pCO2 arterial: 36.8 mmHg (ref 32.0–48.0)
pCO2 arterial: 37.2 mmHg (ref 32.0–48.0)
pCO2 arterial: 41.3 mmHg (ref 32.0–48.0)
pH, Arterial: 7.439 (ref 7.350–7.450)
pH, Arterial: 7.339 — ABNORMAL LOW (ref 7.350–7.450)
pH, Arterial: 7.345 — ABNORMAL LOW (ref 7.350–7.450)
pO2, Arterial: 333 mmHg — ABNORMAL HIGH (ref 83.0–108.0)
pO2, Arterial: 86 mmHg (ref 83.0–108.0)
pO2, Arterial: 100 mmHg (ref 83.0–108.0)

## 2017-04-02 LAB — HEMOGLOBIN AND HEMATOCRIT, BLOOD
HEMATOCRIT: 23 % — AB (ref 39.0–52.0)
HEMOGLOBIN: 7.8 g/dL — AB (ref 13.0–17.0)

## 2017-04-02 LAB — GLUCOSE, CAPILLARY
GLUCOSE-CAPILLARY: 110 mg/dL — AB (ref 65–99)
GLUCOSE-CAPILLARY: 120 mg/dL — AB (ref 65–99)
GLUCOSE-CAPILLARY: 133 mg/dL — AB (ref 65–99)
Glucose-Capillary: 93 mg/dL (ref 65–99)
Glucose-Capillary: 102 mg/dL — ABNORMAL HIGH (ref 65–99)
Glucose-Capillary: 118 mg/dL — ABNORMAL HIGH (ref 65–99)

## 2017-04-02 LAB — PROTIME-INR
INR: 1.64
PROTHROMBIN TIME: 19.3 s — AB (ref 11.4–15.2)

## 2017-04-02 LAB — PLATELET COUNT: PLATELETS: 112 10*3/uL — AB (ref 150–400)

## 2017-04-02 LAB — MAGNESIUM: Magnesium: 2.7 mg/dL — ABNORMAL HIGH (ref 1.7–2.4)

## 2017-04-02 LAB — PREPARE RBC (CROSSMATCH)

## 2017-04-02 SURGERY — REPAIR, MITRAL VALVE, MINIMALLY INVASIVE
Anesthesia: General | Site: Chest | Laterality: Right

## 2017-04-02 MED ORDER — SODIUM CHLORIDE 0.9% FLUSH: 3.0000 mL | Freq: Two times a day (BID) | INTRAVENOUS | Status: DC

## 2017-04-02 MED ORDER — MIDAZOLAM HCL 2 MG/2ML IJ SOLN: 2.0000 mg | INTRAMUSCULAR | Status: DC | PRN

## 2017-04-02 MED ORDER — ACETAMINOPHEN 650 MG RE SUPP: 650.0000 mg | Freq: Once | RECTAL | Status: DC

## 2017-04-02 MED ORDER — PANTOPRAZOLE SODIUM 40 MG PO TBEC
40.0000 mg | DELAYED_RELEASE_TABLET | Freq: Every day | ORAL | Status: DC
  Administered 2017-04-04 – 2017-04-09 (×6): 40 mg via ORAL
  Filled 2017-04-02 (×7): qty 1

## 2017-04-02 MED ORDER — PHENYLEPHRINE 40 MCG/ML (10ML) SYRINGE FOR IV PUSH (FOR BLOOD PRESSURE SUPPORT)
PREFILLED_SYRINGE | INTRAVENOUS | Status: DC | PRN
  Administered 2017-04-02: 80 ug via INTRAVENOUS
  Administered 2017-04-02: 40 ug via INTRAVENOUS
  Administered 2017-04-02: 80 ug via INTRAVENOUS
  Administered 2017-04-02: 40 ug via INTRAVENOUS

## 2017-04-02 MED ORDER — MAGNESIUM SULFATE 4 GM/100ML IV SOLN
4.0000 g | Freq: Once | INTRAVENOUS | Status: AC
  Administered 2017-04-02: 4 g via INTRAVENOUS
  Filled 2017-04-02: qty 100

## 2017-04-02 MED ORDER — 0.9 % SODIUM CHLORIDE (POUR BTL) OPTIME
TOPICAL | Status: DC | PRN
  Administered 2017-04-02: 5000 mL

## 2017-04-02 MED ORDER — PROPOFOL 10 MG/ML IV BOLUS
INTRAVENOUS | Status: AC
  Filled 2017-04-02: qty 20

## 2017-04-02 MED ORDER — SODIUM CHLORIDE 0.9 % IV SOLN
INTRAVENOUS | Status: DC | PRN
  Administered 2017-04-02: .9 [IU]/h via INTRAVENOUS

## 2017-04-02 MED ORDER — LIDOCAINE 2% (20 MG/ML) 5 ML SYRINGE
INTRAMUSCULAR | Status: AC
  Filled 2017-04-02: qty 5

## 2017-04-02 MED ORDER — SODIUM CHLORIDE 0.9 % IV SOLN: INTRAVENOUS | Status: DC

## 2017-04-02 MED ORDER — OXYCODONE HCL 5 MG PO TABS: 5.0000 mg | ORAL_TABLET | ORAL | Status: DC | PRN

## 2017-04-02 MED ORDER — FENTANYL CITRATE (PF) 250 MCG/5ML IJ SOLN
INTRAMUSCULAR | Status: AC
  Filled 2017-04-02: qty 5

## 2017-04-02 MED ORDER — DOPAMINE-DEXTROSE 3.2-5 MG/ML-% IV SOLN
0.0000 ug/kg/min | INTRAVENOUS | Status: DC
  Administered 2017-04-04: 3 ug/kg/min via INTRAVENOUS
  Filled 2017-04-02: qty 250

## 2017-04-02 MED ORDER — SODIUM BICARBONATE 8.4 % IV SOLN
50.0000 meq | Freq: Once | INTRAVENOUS | Status: AC
  Administered 2017-04-02: 50 meq via INTRAVENOUS

## 2017-04-02 MED ORDER — LACTATED RINGERS IV SOLN
INTRAVENOUS | Status: DC | PRN
  Administered 2017-04-02: 07:00:00 via INTRAVENOUS

## 2017-04-02 MED ORDER — VANCOMYCIN HCL IN DEXTROSE 1-5 GM/200ML-% IV SOLN
1000.0000 mg | Freq: Once | INTRAVENOUS | Status: AC
  Administered 2017-04-02: 1000 mg via INTRAVENOUS
  Filled 2017-04-02: qty 200

## 2017-04-02 MED ORDER — ASPIRIN 81 MG PO CHEW: 324.0000 mg | CHEWABLE_TABLET | Freq: Every day | ORAL | Status: DC

## 2017-04-02 MED ORDER — CHLORHEXIDINE GLUCONATE 4 % EX LIQD: 30.0000 mL | CUTANEOUS | Status: DC

## 2017-04-02 MED ORDER — DOCUSATE SODIUM 100 MG PO CAPS
200.0000 mg | ORAL_CAPSULE | Freq: Every day | ORAL | Status: DC
  Administered 2017-04-03 – 2017-04-06 (×4): 200 mg via ORAL
  Filled 2017-04-02 (×4): qty 2

## 2017-04-02 MED ORDER — ACETAMINOPHEN 500 MG PO TABS
1000.0000 mg | ORAL_TABLET | Freq: Four times a day (QID) | ORAL | Status: AC
  Administered 2017-04-02 – 2017-04-07 (×15): 1000 mg via ORAL
  Filled 2017-04-02 (×16): qty 2

## 2017-04-02 MED ORDER — FUROSEMIDE 10 MG/ML IJ SOLN
80.0000 mg | Freq: Once | INTRAMUSCULAR | Status: AC
  Administered 2017-04-02: 80 mg via INTRAVENOUS

## 2017-04-02 MED ORDER — HEPARIN SODIUM (PORCINE) 1000 UNIT/ML IJ SOLN
INTRAMUSCULAR | Status: DC | PRN
  Administered 2017-04-02: 27 mL via INTRAVENOUS

## 2017-04-02 MED ORDER — ARTIFICIAL TEARS OPHTHALMIC OINT
TOPICAL_OINTMENT | OPHTHALMIC | Status: DC | PRN
  Administered 2017-04-02: 1 via OPHTHALMIC

## 2017-04-02 MED ORDER — CHLORHEXIDINE GLUCONATE 0.12% ORAL RINSE (MEDLINE KIT)
15.0000 mL | Freq: Two times a day (BID) | OROMUCOSAL | Status: DC
  Administered 2017-04-02: 15 mL via OROMUCOSAL

## 2017-04-02 MED ORDER — ONDANSETRON HCL 4 MG/2ML IJ SOLN
4.0000 mg | Freq: Four times a day (QID) | INTRAMUSCULAR | Status: DC | PRN
  Administered 2017-04-03: 4 mg via INTRAVENOUS
  Filled 2017-04-02: qty 2

## 2017-04-02 MED ORDER — ROCURONIUM BROMIDE 50 MG/5ML IV SOLN
INTRAVENOUS | Status: AC
  Filled 2017-04-02: qty 2

## 2017-04-02 MED ORDER — PHENYLEPHRINE HCL 10 MG/ML IJ SOLN
INTRAVENOUS | Status: DC | PRN
  Administered 2017-04-02: 20 ug/min via INTRAVENOUS

## 2017-04-02 MED ORDER — MIDAZOLAM HCL 2 MG/2ML IJ SOLN: 0.5000 mg | Freq: Once | INTRAMUSCULAR | Status: DC | PRN

## 2017-04-02 MED ORDER — SODIUM CHLORIDE 0.45 % IV SOLN
INTRAVENOUS | Status: DC | PRN
  Administered 2017-04-02: 15:00:00 via INTRAVENOUS

## 2017-04-02 MED ORDER — FENTANYL CITRATE (PF) 250 MCG/5ML IJ SOLN
INTRAMUSCULAR | Status: DC | PRN
  Administered 2017-04-02: 100 ug via INTRAVENOUS
  Administered 2017-04-02 (×2): 50 ug via INTRAVENOUS
  Administered 2017-04-02: 100 ug via INTRAVENOUS
  Administered 2017-04-02: 50 ug via INTRAVENOUS
  Administered 2017-04-02: 100 ug via INTRAVENOUS
  Administered 2017-04-02: 50 ug via INTRAVENOUS
  Administered 2017-04-02: 350 ug via INTRAVENOUS
  Administered 2017-04-02: 100 ug via INTRAVENOUS

## 2017-04-02 MED ORDER — BUPIVACAINE HCL (PF) 0.5 % IJ SOLN
INTRAMUSCULAR | Status: AC
  Filled 2017-04-02: qty 30

## 2017-04-02 MED ORDER — SODIUM CHLORIDE 0.9% FLUSH: 10.0000 mL | INTRAVENOUS | Status: DC | PRN

## 2017-04-02 MED ORDER — ONDANSETRON HCL 4 MG/2ML IJ SOLN
INTRAMUSCULAR | Status: AC
Start: 2017-04-02 — End: 2017-04-02
  Filled 2017-04-02: qty 2

## 2017-04-02 MED ORDER — SODIUM CHLORIDE 0.9% FLUSH
10.0000 mL | Freq: Two times a day (BID) | INTRAVENOUS | Status: DC
  Administered 2017-04-04 – 2017-04-07 (×6): 10 mL

## 2017-04-02 MED ORDER — SODIUM CHLORIDE 0.9 % IV SOLN: 250.0000 mL | INTRAVENOUS | Status: DC

## 2017-04-02 MED ORDER — LACTATED RINGERS IV SOLN
INTRAVENOUS | Status: DC
  Administered 2017-04-03: 20:00:00 via INTRAVENOUS

## 2017-04-02 MED ORDER — FAMOTIDINE IN NACL 20-0.9 MG/50ML-% IV SOLN
20.0000 mg | Freq: Two times a day (BID) | INTRAVENOUS | Status: AC
  Administered 2017-04-02 (×2): 20 mg via INTRAVENOUS
  Filled 2017-04-02 (×2): qty 50

## 2017-04-02 MED ORDER — BUPIVACAINE HCL (PF) 0.5 % IJ SOLN
INTRAMUSCULAR | Status: DC | PRN
  Administered 2017-04-02: 10 mL

## 2017-04-02 MED ORDER — ALBUMIN HUMAN 5 % IV SOLN
INTRAVENOUS | Status: DC | PRN
  Administered 2017-04-02 (×2): via INTRAVENOUS

## 2017-04-02 MED ORDER — SODIUM CHLORIDE 0.9 % IV SOLN
INTRAVENOUS | Status: DC | PRN
  Administered 2017-04-02: .5 ug/kg/h via INTRAVENOUS

## 2017-04-02 MED ORDER — SODIUM CHLORIDE 0.9 % IV SOLN
INTRAVENOUS | Status: DC | PRN
  Administered 2017-04-02: 14:00:00 via INTRAVENOUS

## 2017-04-02 MED ORDER — MIDAZOLAM HCL 5 MG/5ML IJ SOLN
INTRAMUSCULAR | Status: DC | PRN
  Administered 2017-04-02: 5 mg via INTRAVENOUS
  Administered 2017-04-02: 2 mg via INTRAVENOUS
  Administered 2017-04-02: 1 mg via INTRAVENOUS

## 2017-04-02 MED ORDER — POTASSIUM CHLORIDE 10 MEQ/50ML IV SOLN: 10.0000 meq | INTRAVENOUS | Status: AC

## 2017-04-02 MED ORDER — SODIUM CHLORIDE 0.9 % IV SOLN: Freq: Once | INTRAVENOUS | Status: DC

## 2017-04-02 MED ORDER — LACTATED RINGERS IV SOLN
INTRAVENOUS | Status: DC | PRN
  Administered 2017-04-02 (×2): via INTRAVENOUS

## 2017-04-02 MED ORDER — SODIUM CHLORIDE 0.9 % IV SOLN
0.0000 ug/kg/h | INTRAVENOUS | Status: DC
  Filled 2017-04-02: qty 2

## 2017-04-02 MED ORDER — METOPROLOL TARTRATE 5 MG/5ML IV SOLN: 2.5000 mg | INTRAVENOUS | Status: DC | PRN

## 2017-04-02 MED ORDER — MORPHINE SULFATE (PF) 4 MG/ML IV SOLN: 1.0000 mg | INTRAVENOUS | Status: DC | PRN

## 2017-04-02 MED ORDER — LEVOFLOXACIN IN D5W 750 MG/150ML IV SOLN
750.0000 mg | INTRAVENOUS | Status: AC
  Administered 2017-04-03: 750 mg via INTRAVENOUS
  Filled 2017-04-02: qty 150

## 2017-04-02 MED ORDER — INSULIN REGULAR BOLUS VIA INFUSION
0.0000 [IU] | Freq: Three times a day (TID) | INTRAVENOUS | Status: DC
  Filled 2017-04-02: qty 10

## 2017-04-02 MED ORDER — LACTATED RINGERS IV SOLN: INTRAVENOUS | Status: DC

## 2017-04-02 MED ORDER — BUPIVACAINE HCL (PF) 0.5 % IJ SOLN
INTRAMUSCULAR | Status: AC
  Filled 2017-04-02: qty 10

## 2017-04-02 MED ORDER — ACETAMINOPHEN 160 MG/5ML PO SOLN: 1000.0000 mg | Freq: Four times a day (QID) | ORAL | Status: DC

## 2017-04-02 MED ORDER — NITROGLYCERIN IN D5W 200-5 MCG/ML-% IV SOLN: 0.0000 ug/min | INTRAVENOUS | Status: DC

## 2017-04-02 MED ORDER — BISACODYL 5 MG PO TBEC
10.0000 mg | DELAYED_RELEASE_TABLET | Freq: Every day | ORAL | Status: DC
  Administered 2017-04-03 – 2017-04-05 (×3): 10 mg via ORAL
  Filled 2017-04-02 (×3): qty 2

## 2017-04-02 MED ORDER — CHLORHEXIDINE GLUCONATE 0.12 % MT SOLN
15.0000 mL | OROMUCOSAL | Status: AC
Start: 2017-04-02 — End: 2017-04-02
  Administered 2017-04-02: 15 mL via OROMUCOSAL

## 2017-04-02 MED ORDER — MEPERIDINE HCL 50 MG/ML IJ SOLN: 6.2500 mg | INTRAMUSCULAR | Status: DC | PRN

## 2017-04-02 MED ORDER — PROMETHAZINE HCL 25 MG/ML IJ SOLN: 6.2500 mg | INTRAMUSCULAR | Status: DC | PRN

## 2017-04-02 MED ORDER — MORPHINE SULFATE (PF) 2 MG/ML IV SOLN: 1.0000 mg | INTRAVENOUS | Status: DC | PRN

## 2017-04-02 MED ORDER — ROCURONIUM BROMIDE 50 MG/5ML IV SOLN
INTRAVENOUS | Status: AC
  Filled 2017-04-02: qty 1

## 2017-04-02 MED ORDER — ASPIRIN EC 325 MG PO TBEC
325.0000 mg | DELAYED_RELEASE_TABLET | Freq: Every day | ORAL | Status: DC
  Administered 2017-04-03 – 2017-04-05 (×3): 325 mg via ORAL
  Filled 2017-04-02 (×3): qty 1

## 2017-04-02 MED ORDER — ONDANSETRON HCL 4 MG/2ML IJ SOLN
INTRAMUSCULAR | Status: DC | PRN
  Administered 2017-04-02: 4 mg via INTRAVENOUS

## 2017-04-02 MED ORDER — EPHEDRINE 5 MG/ML INJ
INTRAVENOUS | Status: AC
  Filled 2017-04-02: qty 10

## 2017-04-02 MED ORDER — BISACODYL 10 MG RE SUPP: 10.0000 mg | Freq: Every day | RECTAL | Status: DC

## 2017-04-02 MED ORDER — ALBUMIN HUMAN 5 % IV SOLN
250.0000 mL | INTRAVENOUS | Status: AC | PRN
  Administered 2017-04-02 – 2017-04-03 (×3): 250 mL via INTRAVENOUS
  Filled 2017-04-02: qty 250

## 2017-04-02 MED ORDER — DOPAMINE-DEXTROSE 3.2-5 MG/ML-% IV SOLN
INTRAVENOUS | Status: DC | PRN
  Administered 2017-04-02: 3 ug/kg/min via INTRAVENOUS

## 2017-04-02 MED ORDER — SODIUM CHLORIDE 0.9% FLUSH: 3.0000 mL | INTRAVENOUS | Status: DC | PRN

## 2017-04-02 MED ORDER — PROTAMINE SULFATE 10 MG/ML IV SOLN
INTRAVENOUS | Status: DC | PRN
  Administered 2017-04-02: 25 mg via INTRAVENOUS

## 2017-04-02 MED ORDER — PROTAMINE SULFATE 10 MG/ML IV SOLN
INTRAVENOUS | Status: AC
  Filled 2017-04-02: qty 25

## 2017-04-02 MED ORDER — CHLORHEXIDINE GLUCONATE CLOTH 2 % EX PADS
6.0000 | MEDICATED_PAD | Freq: Every day | CUTANEOUS | Status: DC
  Administered 2017-04-02 – 2017-04-08 (×7): 6 via TOPICAL

## 2017-04-02 MED ORDER — ACETAMINOPHEN 160 MG/5ML PO SOLN: 650.0000 mg | Freq: Once | ORAL | Status: DC

## 2017-04-02 MED ORDER — BUPIVACAINE 0.5 % ON-Q PUMP SINGLE CATH 400 ML
400.0000 mL | INJECTION | Status: AC
  Administered 2017-04-02: 400 mL
  Filled 2017-04-02: qty 400

## 2017-04-02 MED ORDER — SODIUM CHLORIDE 0.9 % IV SOLN
0.0000 ug/min | INTRAVENOUS | Status: DC
  Administered 2017-04-02: 30 ug/min via INTRAVENOUS
  Administered 2017-04-02: 40 ug/min via INTRAVENOUS
  Administered 2017-04-03: 70 ug/min via INTRAVENOUS
  Filled 2017-04-02: qty 20
  Filled 2017-04-02 (×2): qty 2

## 2017-04-02 MED ORDER — MORPHINE SULFATE (PF) 4 MG/ML IV SOLN
1.0000 mg | INTRAVENOUS | Status: DC | PRN
  Administered 2017-04-06: 2 mg via INTRAVENOUS
  Filled 2017-04-02 (×2): qty 1

## 2017-04-02 MED ORDER — MIDAZOLAM HCL 10 MG/2ML IJ SOLN
INTRAMUSCULAR | Status: AC
  Filled 2017-04-02: qty 2

## 2017-04-02 MED ORDER — SODIUM CHLORIDE 0.9 % IV SOLN
INTRAVENOUS | Status: DC
  Administered 2017-04-02: 15:00:00 via INTRAVENOUS

## 2017-04-02 MED ORDER — PROPOFOL 10 MG/ML IV BOLUS
INTRAVENOUS | Status: DC | PRN
  Administered 2017-04-02: 80 mg via INTRAVENOUS

## 2017-04-02 MED ORDER — LACTATED RINGERS IV SOLN: 500.0000 mL | Freq: Once | INTRAVENOUS | Status: DC | PRN

## 2017-04-02 MED ORDER — LIDOCAINE 2% (20 MG/ML) 5 ML SYRINGE
INTRAMUSCULAR | Status: DC | PRN
  Administered 2017-04-02: 40 mg via INTRAVENOUS

## 2017-04-02 MED ORDER — PHENYLEPHRINE 40 MCG/ML (10ML) SYRINGE FOR IV PUSH (FOR BLOOD PRESSURE SUPPORT)
PREFILLED_SYRINGE | INTRAVENOUS | Status: AC
Start: 2017-04-02 — End: 2017-04-02
  Filled 2017-04-02: qty 10

## 2017-04-02 MED ORDER — ORAL CARE MOUTH RINSE
15.0000 mL | OROMUCOSAL | Status: DC
  Administered 2017-04-02: 15 mL via OROMUCOSAL

## 2017-04-02 MED ORDER — ROCURONIUM BROMIDE 100 MG/10ML IV SOLN
INTRAVENOUS | Status: DC | PRN
  Administered 2017-04-02: 30 mg via INTRAVENOUS
  Administered 2017-04-02: 20 mg via INTRAVENOUS
  Administered 2017-04-02 (×2): 50 mg via INTRAVENOUS
  Administered 2017-04-02: 30 mg via INTRAVENOUS
  Administered 2017-04-02: 20 mg via INTRAVENOUS

## 2017-04-02 MED ORDER — TRAMADOL HCL 50 MG PO TABS
50.0000 mg | ORAL_TABLET | ORAL | Status: DC | PRN
  Administered 2017-04-04 (×2): 100 mg via ORAL
  Administered 2017-04-05 (×2): 50 mg via ORAL
  Administered 2017-04-06: 100 mg via ORAL
  Administered 2017-04-06: 50 mg via ORAL
  Administered 2017-04-07 – 2017-04-08 (×4): 100 mg via ORAL
  Filled 2017-04-02 (×3): qty 2
  Filled 2017-04-02: qty 1
  Filled 2017-04-02: qty 2
  Filled 2017-04-02: qty 1
  Filled 2017-04-02 (×2): qty 2
  Filled 2017-04-02: qty 1
  Filled 2017-04-02: qty 2

## 2017-04-02 MED ORDER — DEXTROSE 5 % IV SOLN
INTRAVENOUS | Status: DC | PRN
  Administered 2017-04-02: 20 ug/min via INTRAVENOUS

## 2017-04-02 SURGICAL SUPPLY — 117 items
ADAPTER CARDIO PERF ANTE/RETRO (ADAPTER) ×4 IMPLANT
ARTICLIP LAA PROCLIP II 45 (Clip) ×4 IMPLANT
BAG DECANTER FOR FLEXI CONT (MISCELLANEOUS) ×8 IMPLANT
BLADE SURG 11 STRL SS (BLADE) ×4 IMPLANT
CANISTER SUCT 3000ML PPV (MISCELLANEOUS) ×8 IMPLANT
CANNULA FEM VENOUS REMOTE 22FR (CANNULA) ×4 IMPLANT
CANNULA FEMORAL ART 14 SM (MISCELLANEOUS) ×4 IMPLANT
CANNULA GUNDRY RCSP 15FR (MISCELLANEOUS) ×4 IMPLANT
CANNULA OPTISITE PERFUSION 16F (CANNULA) IMPLANT
CANNULA OPTISITE PERFUSION 18F (CANNULA) ×4 IMPLANT
CANNULA SUMP PERICARDIAL (CANNULA) ×8 IMPLANT
CARDIOBLATE CARDIAC ABLATION (MISCELLANEOUS)
CATH KIT ON Q 5IN SLV (PAIN MANAGEMENT) IMPLANT
CATH KIT ON-Q SILVERSOAK 5IN (CATHETERS) ×4 IMPLANT
CELLS DAT CNTRL 66122 CELL SVR (MISCELLANEOUS) ×3 IMPLANT
CLAMP OLL ABLATION (MISCELLANEOUS) ×4 IMPLANT
CONN ST 1/4X3/8  BEN (MISCELLANEOUS) ×4
CONN ST 1/4X3/8 BEN (MISCELLANEOUS) ×12 IMPLANT
CONNECTOR 1/2X3/8X1/2 3 WAY (MISCELLANEOUS) ×1
CONNECTOR 1/2X3/8X1/2 3WAY (MISCELLANEOUS) ×3 IMPLANT
CONT SPEC 4OZ CLIKSEAL STRL BL (MISCELLANEOUS) ×8 IMPLANT
COVER BACK TABLE 24X17X13 BIG (DRAPES) ×4 IMPLANT
CRADLE DONUT ADULT HEAD (MISCELLANEOUS) ×4 IMPLANT
DERMABOND ADHESIVE PROPEN (GAUZE/BANDAGES/DRESSINGS) ×1
DERMABOND ADVANCED (GAUZE/BANDAGES/DRESSINGS) ×2
DERMABOND ADVANCED .7 DNX12 (GAUZE/BANDAGES/DRESSINGS) ×6 IMPLANT
DERMABOND ADVANCED .7 DNX6 (GAUZE/BANDAGES/DRESSINGS) ×3 IMPLANT
DEVICE ATRICLIP LAA PRCLPII 45 (Clip) ×3 IMPLANT
DEVICE CARDIOBLATE CARDIAC ABL (MISCELLANEOUS) IMPLANT
DEVICE PMI PUNCTURE CLOSURE (MISCELLANEOUS) ×4 IMPLANT
DEVICE SUT CK QUICK LOAD MINI (Prosthesis & Implant Heart) ×8 IMPLANT
DEVICE TROCAR PUNCTURE CLOSURE (ENDOMECHANICALS) ×4 IMPLANT
DRAIN CHANNEL 28F RND 3/8 FF (WOUND CARE) ×8 IMPLANT
DRAPE BILATERAL SPLIT (DRAPES) ×4 IMPLANT
DRAPE C-ARM 42X72 X-RAY (DRAPES) ×4 IMPLANT
DRAPE CV SPLIT W-CLR ANES SCRN (DRAPES) ×4 IMPLANT
DRAPE INCISE IOBAN 66X45 STRL (DRAPES) ×12 IMPLANT
DRAPE SLUSH/WARMER DISC (DRAPES) ×4 IMPLANT
DRSG COVADERM 4X8 (GAUZE/BANDAGES/DRESSINGS) ×4 IMPLANT
ELECT BLADE 6.5 EXT (BLADE) ×4 IMPLANT
ELECT REM PT RETURN 9FT ADLT (ELECTROSURGICAL) ×8
ELECTRODE REM PT RTRN 9FT ADLT (ELECTROSURGICAL) ×6 IMPLANT
FELT TEFLON 1X6 (MISCELLANEOUS) ×8 IMPLANT
FEMORAL VENOUS CANN RAP (CANNULA) IMPLANT
GAUZE SPONGE 4X4 12PLY STRL (GAUZE/BANDAGES/DRESSINGS) ×4 IMPLANT
GAUZE SPONGE 4X4 12PLY STRL LF (GAUZE/BANDAGES/DRESSINGS) ×4 IMPLANT
GLOVE BIO SURGEON STRL SZ 6.5 (GLOVE) ×24 IMPLANT
GLOVE ORTHO TXT STRL SZ7.5 (GLOVE) ×12 IMPLANT
GOWN STRL REUS W/ TWL LRG LVL3 (GOWN DISPOSABLE) ×24 IMPLANT
GOWN STRL REUS W/TWL LRG LVL3 (GOWN DISPOSABLE) ×8
IV NS 1000ML (IV SOLUTION) ×1
IV NS 1000ML BAXH (IV SOLUTION) ×3 IMPLANT
IV NS IRRIG 3000ML ARTHROMATIC (IV SOLUTION) ×4 IMPLANT
KIT BASIN OR (CUSTOM PROCEDURE TRAY) ×4 IMPLANT
KIT DILATOR VASC 18G NDL (KITS) ×4 IMPLANT
KIT DRAINAGE VACCUM ASSIST (KITS) ×4 IMPLANT
KIT ROOM TURNOVER OR (KITS) ×4 IMPLANT
KIT SUCTION CATH 14FR (SUCTIONS) ×4 IMPLANT
KIT SUT CK MINI COMBO 4X17 (Prosthesis & Implant Heart) ×4 IMPLANT
LEAD PACING MYOCARDI (MISCELLANEOUS) ×4 IMPLANT
LINE VENT (MISCELLANEOUS) ×4 IMPLANT
NEEDLE AORTIC ROOT 14G 7F (CATHETERS) ×4 IMPLANT
NS IRRIG 1000ML POUR BTL (IV SOLUTION) ×20 IMPLANT
PACK OPEN HEART (CUSTOM PROCEDURE TRAY) ×4 IMPLANT
PAD ARMBOARD 7.5X6 YLW CONV (MISCELLANEOUS) ×8 IMPLANT
PAD ELECT DEFIB RADIOL ZOLL (MISCELLANEOUS) ×4 IMPLANT
PROBE CRYO-ABLATION (MISCELLANEOUS) ×4 IMPLANT
PROBE CRYO2-ABLATION MALLABLE (MISCELLANEOUS) IMPLANT
RING MITRAL MEMO 3D 28MM SMD28 (Prosthesis & Implant Heart) ×4 IMPLANT
RTRCTR WOUND ALEXIS 18CM MED (MISCELLANEOUS) ×4
SET CANNULATION TOURNIQUET (MISCELLANEOUS) ×4 IMPLANT
SET CARDIOPLEGIA MPS 5001102 (MISCELLANEOUS) ×4 IMPLANT
SET IRRIG TUBING LAPAROSCOPIC (IRRIGATION / IRRIGATOR) ×4 IMPLANT
SOLUTION ANTI FOG 6CC (MISCELLANEOUS) ×4 IMPLANT
SPONGE GAUZE 4X4 12PLY STER LF (GAUZE/BANDAGES/DRESSINGS) ×4 IMPLANT
SUT BONE WAX W31G (SUTURE) ×4 IMPLANT
SUT E-PACK MINIMALLY INVASIVE (SUTURE) ×4 IMPLANT
SUT ETHIBOND (SUTURE) ×8 IMPLANT
SUT ETHIBOND 2 0 SH (SUTURE) ×4 IMPLANT
SUT ETHIBOND 2 0 V4 (SUTURE) IMPLANT
SUT ETHIBOND 2 0V4 GREEN (SUTURE) IMPLANT
SUT ETHIBOND 2-0 RB-1 WHT (SUTURE) ×8 IMPLANT
SUT ETHIBOND 4 0 TF (SUTURE) IMPLANT
SUT ETHIBOND 5 0 C 1 30 (SUTURE) IMPLANT
SUT ETHIBOND NAB MH 2-0 36IN (SUTURE) IMPLANT
SUT ETHIBOND X763 2 0 SH 1 (SUTURE) ×4 IMPLANT
SUT GORETEX 6.0 TH-9 30 IN (SUTURE) IMPLANT
SUT GORETEX CV 4 TH 22 36 (SUTURE) ×4 IMPLANT
SUT GORETEX CV-5THC-13 36IN (SUTURE) IMPLANT
SUT GORETEX CV4 TH-18 (SUTURE) ×8 IMPLANT
SUT GORETEX TH-18 36 INCH (SUTURE) IMPLANT
SUT PROLENE 3 0 SH1 36 (SUTURE) ×28 IMPLANT
SUT PROLENE 4 0 RB 1 (SUTURE) ×1
SUT PROLENE 4 0 SH DA (SUTURE) ×4 IMPLANT
SUT PROLENE 4-0 RB1 .5 CRCL 36 (SUTURE) ×3 IMPLANT
SUT PTFE CHORD X 24MM (SUTURE) ×4 IMPLANT
SUT SILK  1 MH (SUTURE) ×2
SUT SILK 1 MH (SUTURE) ×6 IMPLANT
SUT SILK 2 0 SH CR/8 (SUTURE) IMPLANT
SUT SILK 3 0 SH CR/8 (SUTURE) IMPLANT
SUT VIC AB 2-0 CTX 36 (SUTURE) IMPLANT
SUT VIC AB 3-0 SH 8-18 (SUTURE) ×4 IMPLANT
SUT VICRYL 2 TP 1 (SUTURE) IMPLANT
SYR 10ML LL (SYRINGE) ×4 IMPLANT
SYSTEM SAHARA CHEST DRAIN ATS (WOUND CARE) ×8 IMPLANT
TAPE CLOTH SURG 4X10 WHT LF (GAUZE/BANDAGES/DRESSINGS) ×4 IMPLANT
TAPE PAPER 2X10 WHT MICROPORE (GAUZE/BANDAGES/DRESSINGS) ×4 IMPLANT
TOWEL GREEN STERILE (TOWEL DISPOSABLE) ×4 IMPLANT
TOWEL GREEN STERILE FF (TOWEL DISPOSABLE) ×4 IMPLANT
TRAY FOLEY SILVER 16FR TEMP (SET/KITS/TRAYS/PACK) ×4 IMPLANT
TROCAR XCEL BLADELESS 5X75MML (TROCAR) ×4 IMPLANT
TROCAR XCEL NON-BLD 11X100MML (ENDOMECHANICALS) ×8 IMPLANT
TUBE SUCT INTRACARD DLP 20F (MISCELLANEOUS) ×4 IMPLANT
TUNNELER SHEATH ON-Q 11GX8 DSP (PAIN MANAGEMENT) ×4 IMPLANT
UNDERPAD 30X30 (UNDERPADS AND DIAPERS) ×4 IMPLANT
WATER STERILE IRR 1000ML POUR (IV SOLUTION) ×8 IMPLANT
WIRE .035 3MM-J 145CM (WIRE) ×4 IMPLANT

## 2017-04-02 NOTE — Anesthesia Postprocedure Evaluation (Signed)
Anesthesia Post Note  Patient: Aadhav Uhlig  Procedure(s) Performed: MINIMALLY INVASIVE MITRAL VALVE REPAIR (MVR) with (Right Chest) MINIMALLY INVASIVE MAZE PROCEDURE (N/A ) TRANSESOPHAGEAL ECHOCARDIOGRAM (TEE) (N/A ) CLIPPING OF ATRIAL APPENDAGE (N/A Chest) PATENT FORAMEN OVALE (PFO) CLOSURE (N/A Chest)     Patient location during evaluation: SICU Anesthesia Type: General Level of consciousness: sedated and patient remains intubated per anesthesia plan Pain management: pain level controlled Vital Signs Assessment: post-procedure vital signs reviewed and stable Respiratory status: patient remains intubated per anesthesia plan and patient on ventilator - see flowsheet for VS Cardiovascular status: stable Postop Assessment: no apparent nausea or vomiting Anesthetic complications: no    Last Vitals:  Vitals:   04/02/17 1830 04/02/17 1845  BP: (!) 107/59 112/61  Pulse:    Resp: 15 (!) 22  Temp: (!) 36.3 C (!) 36.3 C  SpO2: 100% 100%    Last Pain:  Vitals:   04/02/17 1815  TempSrc: Core (Comment)                 Sofhia Ulibarri COKER

## 2017-04-02 NOTE — Anesthesia Procedure Notes (Addendum)
Procedure Name: Intubation Date/Time: 04/02/2017 8:01 AM Performed by: Orlie Dakin, CRNA Pre-anesthesia Checklist: Patient identified, Suction available, Patient being monitored, Timeout performed and Emergency Drugs available Patient Re-evaluated:Patient Re-evaluated prior to induction Oxygen Delivery Method: Circle system utilized Preoxygenation: Pre-oxygenation with 100% oxygen Induction Type: Combination inhalational/ intravenous induction Ventilation: Mask ventilation without difficulty and Oral airway inserted - appropriate to patient size Laryngoscope Size: Mac and 3 Grade View: Grade I Endobronchial tube: Double lumen EBT, EBT position confirmed by fiberoptic bronchoscope and Left and 37 Fr Number of attempts: 1 Airway Equipment and Method: Stylet and Fiberoptic brochoscope Placement Confirmation: ETT inserted through vocal cords under direct vision,  positive ETCO2 and breath sounds checked- equal and bilateral Tube secured with: Tape Dental Injury: Teeth and Oropharynx as per pre-operative assessment

## 2017-04-02 NOTE — Progress Notes (Signed)
RT NOTE:  Pt returned to Full support.  Pt sleepy. RR < 6, Ve 3.1

## 2017-04-02 NOTE — Progress Notes (Signed)
RT NOTE:  Cardiac Rapid Wean initiated.  

## 2017-04-02 NOTE — Procedures (Signed)
Extubation Procedure Note  Pt completed Cardiac Wean without complication. Follows all commands. ABG acceptable, NIF -38, VC 1.1L, Cuff leak +  Pt extubated to 4L [place], No Stridor. A&O Clear BBS, strong cough  Patient Details:   Name: [name] [name] DOB: 26-Nov-1940 MRN: [id number]   Airway Documentation:     Evaluation  O2 sats: stable throughout Complications: No apparent complications Patient did tolerate procedure well. Bilateral Breath Sounds: Clear, Diminished   Yes  Marissa Nestle 04/02/2017, 10:08 PM

## 2017-04-02 NOTE — Anesthesia Procedure Notes (Signed)
Arterial Line Insertion Start/End2/28/2019 6:48 AM, 04/02/2017 6:50 AM Performed by: Orlie Dakin, CRNA, CRNA  Patient location: Pre-op. Preanesthetic checklist: patient identified, IV checked, site marked, risks and benefits discussed, surgical consent, monitors and equipment checked, pre-op evaluation and timeout performed Patient sedated Left, radial was placed Catheter size: 20 G Hand hygiene performed  and maximum sterile barriers used   Attempts: 1 Procedure performed without using ultrasound guided technique. Following insertion, dressing applied and Biopatch. Post procedure assessment: normal  Patient tolerated the procedure well with no immediate complications. Additional procedure comments: K Renda Rolls performed art line insertion with my supervision.Marland Kitchen

## 2017-04-02 NOTE — Anesthesia Procedure Notes (Signed)
Central Venous Catheter Insertion Performed by: Roberts Gaudy, MD, anesthesiologist Start/End2/28/2019 7:10 AM, 04/02/2017 7:15 AM Patient location: Pre-op. Preanesthetic checklist: patient identified, IV checked, site marked, risks and benefits discussed, surgical consent, monitors and equipment checked, pre-op evaluation, timeout performed and anesthesia consent Lidocaine 1% used for infiltration and patient sedated Hand hygiene performed  and maximum sterile barriers used  Catheter size: 8.5 Fr Sheath introducer Procedure performed using ultrasound guided technique. Ultrasound Notes:anatomy identified, needle tip was noted to be adjacent to the nerve/plexus identified, no ultrasound evidence of intravascular and/or intraneural injection and image(s) printed for medical record Attempts: 1 Following insertion, line sutured and dressing applied. Post procedure assessment: blood return through all ports, free fluid flow and no air  Patient tolerated the procedure well with no immediate complications.

## 2017-04-02 NOTE — Anesthesia Procedure Notes (Signed)
Central Venous Catheter Insertion Performed by: Roberts Gaudy, MD, anesthesiologist Start/End2/28/2019 7:10 AM, 04/02/2017 7:15 AM Patient location: Pre-op. Preanesthetic checklist: patient identified, IV checked, site marked, risks and benefits discussed, surgical consent, monitors and equipment checked, pre-op evaluation, timeout performed and anesthesia consent Hand hygiene performed  and maximum sterile barriers used  PA cath was placed.Swan type:thermodilution Procedure performed without using ultrasound guided technique. Attempts: 1 Patient tolerated the procedure well with no immediate complications.

## 2017-04-02 NOTE — Op Note (Signed)
CARDIOTHORACIC SURGERY OPERATIVE NOTE  Date of Procedure:   04/02/2017  Preoperative Diagnosis:    Severe Mitral Regurgitation  Recurrent Persistent Atrial Fibrillation  Postoperative Diagnosis: Same  Procedure:    Minimally-Invasive Mitral Valve Repair  Complex valvuloplasty including artificial Gore-tex neochord placement x6  Sorin Memo 3D Ring Annuloplasty (size 28 mm, catalog #[id number], serial [id number])  Closure of patent foramen ovale   Minimally-Invasive Maze Procedure  Complete bilateral atrial lesion set using cryothermy and bipolar radiofrequency ablation  Clipping of Left Atrial Appendage (Atricure left atrial clip, size 45 mm)  Endocardial oversewing of Left atrial appendage stump  Surgeon: Valentina Gu. Roxy Manns, MD  Assistant: Ellwood Handler, PA-C  Anesthesia: Roberts Gaudy, MD  Operative Findings:  Fibroelastic deficiency type myxomatous degenerative disease  Multiple elongated primary chordae tendinae with prolapse of anterior leaflet  Annular dilitation  Type I and type II mitral valve dysfunction with severe mitral regurgitation  Normal left ventricular systolic function  Severe left atrial enlargement  Trace residual mitral regurgitation after successful valve repair                     BRIEF CLINICAL NOTE AND INDICATIONS FOR SURGERY  Patient is a 77 year old male with history of mitral regurgitation, recurrent persistent atrial fibrillation that has failed recent DC cardioversion, chronic diastolic congestive heart failure, hypertension, chronic kidney disease (stageII-III), hyperlipidemia, and GE reflux disease who has been referred for surgical consultation to discuss treatment options for management of mitral regurgitation and atrial fibrillation. The patient states that he has been told that he had a heart murmur most of his life. Approximately 6-8 months ago the patient began to develop worsening symptoms of exertional shortness of  breath. He was evaluated by his primary care physician and noted to be in atrial fibrillation. He was referred for cardiology consultation and evaluated by Dr. [name] initially on October 27, 2016. Baseline transthoracic echocardiogram performed October 31, 2016 revealed normal left ventricular systolic function with some restriction of the posterior leaflet motion and mitral regurgitation that was "not well characterized" butfelt to be mild in severity. The patient was anticoagulated using Eliquisand brought in for elective TEE and cardioversion on January 02, 2017. TEE revealed severe mitral regurgitation but there was no sign of left atrial thrombus. The patient underwent DC cardioversion but did notmaintain.Left and right heart catheterization performed February 13, 2017 revealed normal coronary artery anatomy with minimal nonobstructive coronary artery disease. Catheterization confirmed the presence of severe mitral regurgitation. Pulmonary artery pressures were mildly elevated. He was seen in follow-up by Dr. [name] on February 18, 2017 and subsequently started on amiodarone. Surgical consultation was requested.  The patient has been seen in consultation and counseled at length regarding the indications, risks and potential benefits of surgery.  All questions have been answered, and the patient provides full informed consent for the operation as described.    DETAILS OF THE OPERATIVE PROCEDURE  Preparation:  The patient is brought to the operating room on the above mentioned date and central monitoring was established by the anesthesia team including placement of Swan-Ganz catheter through the left internal jugular vein.  A radial arterial line is placed. The patient is placed in the supine position on the operating table.  Intravenous antibiotics are administered. General endotracheal anesthesia is induced uneventfully. The patient is initially intubated using a dual lumen  endotracheal tube.  A Foley catheter is placed.  Baseline transesophageal echocardiogram was performed.  Findings were notable for the presence of severe  mitral regurgitation.  The mitral annulus was dilated.  The anterior leaflet prolapsed over the posterior leaflet causing an eccentric jet of regurgitation that coursed posteriorly around the left atrium.  The left atrium was severely dilated.  There were no obvious ruptured chordae tendinae.  Left ventricular size and function appeared normal.  Right ventricular size and function appeared normal.  There was trace tricuspid regurgitation.  There was a small patent foramen ovale.  A soft roll is placed behind the patient's left scapula and the neck gently extended and turned to the left.   The patient's right neck, chest, abdomen, both groins, and both lower extremities are prepared and draped in a sterile manner. A time out procedure is performed.   Surgical Approach:  A right miniature anterolateral thoracotomy incision is performed. The incision is placed just lateral to and superior to the right nipple. The pectoralis major muscle is retracted medially and completely preserved. The right pleural space is entered through the 3rd intercostal space. A soft tissue retractor is placed.  The patient had a moderate size right pleural effusion which was drained.  Two 11 mm ports are placed through separate stab incisions inferiorly. The right pleural space is insufflated continuously with carbon dioxide gas through the posterior port during the remainder of the operation.  A pledgeted sutures placed through the dome of the right hemidiaphragm and retracted inferiorly to facilitate exposure.  A longitudinal incision is made in the pericardium 3 cm anterior to the phrenic nerve and silk traction sutures are placed on either side of the incision for exposure.   Extracorporeal Cardiopulmonary Bypass:  A small incision is made in the right inguinal crease and  the anterior surface of the right common femoral artery and right common femoral vein are identified.  The patient is placed in Trendelenburg position. The right internal jugular vein is cannulated with Seldinger technique and a guidewire advanced into the right atrium. The patient is heparinized systemically. The right internal jugular vein is cannulated with a 14 [place] pediatric femoral venous cannula. Pursestring sutures are placed on the anterior surface of the right common femoral vein and right common femoral artery. The right common femoral vein is cannulated with the Seldinger technique and a guidewire is advanced under transesophageal echocardiogram guidance through the right atrium. The femoral vein is cannulated with a long 22 French femoral venous cannula. The right common femoral artery is cannulated with Seldinger technique and a flexible guidewire is advanced until it can be appreciated intraluminally in the descending thoracic aorta on transesophageal echocardiogram. The femoral artery is cannulated with an 18 French femoral arterial cannula.  Adequate heparinization is verified.      The entire pre-bypass portion of the operation was notable for stable hemodynamics.  Cardiopulmonary bypass was begun.  Vacuum assist venous drainage is utilized. The incision in the pericardium is extended in both directions. Venous drainage and exposure are notably excellent.    Clipping of Left Atrial Appendage:  The left atrial appendage is obliterated using an Atricure left atrial appendage clip (Atriclip Pro245, size 74mm).  The clip is applied under thoracoscopic visualization posterior to the aorta and pulmonary artery through the oblique sinus.  The clip was applied prior to application of the aortic crossclamp, with transesophageal echocardiographic confirmation that the clip satisfactorily obliterates the appendage.   Myocardial Protection:  A retrograde cardioplegia cannula is placed through  the right atrium into the coronary sinus using transesophageal echocardiogram guidance.  An antegrade cardioplegia cannula is placed in  the ascending aorta.  The patient is cooled to 28C systemic temperature.  The aortic cross clamp is applied and cardioplegia is delivered initially in an antegrade fashion through the aortic root using modified [place] cold blood cardioplegia ([place] blood cardioplegia protocol).   The initial cardioplegic arrest is rapid with early diastolic arrest.  Repeat doses of cardioplegia are administered at 90 minutes and every 30 minutes thereafter through the coronary sinus catheter in order to maintain completely flat electrocardiogram.  Myocardial protection was felt to be excellent.   Closure of Patent Foramen Ovale:  Following placement of the aortic crossclamp and the administration of the initial arresting dose of cardioplegia, a left atriotomy incision was performed through the interatrial groove and extended partially across the back wall of the left atrium after opening the oblique sinus inferiorly.  Upon exposure of the left atrial surface of the intra-atrial septum with a small patent foramen ovale was identified.  The patent foramen ovale was closed using simple running 4-0 Prolene suture.   Maze Procedure (left atrial lesion set):  The mitral valve and floor of the left atrium are exposed using a self-retaining retractor.  The Atricure CryoICE nitrous oxide cryothermy system is utilized for all cryothermy ablation lesions.  The left atrial lesion set of the Cox cryomaze procedure is now performed using 3 minute duration for all cryothermy lesions.  Initially a lesion is placed along the endocardial surface of the left atrium from the caudad apex of the atriotomy incision across the posterior wall of the left atrium onto the posterior mitral annulus.  A mirror image lesion along the epicardial surface is then performed with the probe posterior to the left  atrium, crossing over the coronary sinus.  Two lesions are then performed to create a box isolating all of the pulmonary veins from the remainder of the left atrium.  The first lesion is placed from the cephalad apex of the atriotomy incision across the dome of the left atrium to just anterior to the left sided pulmonary veins.  The second lesion completes the box from the caudad apex of the atriotomy incision across the back wall of the left atrium to connect with the previous lesion just anterior to the left sided pulmonary veins.  Finally, the origin of the right-sided pulmonary veins is isolated using an elliptical lesion created using bipolar radiofrequency ablation.   Mitral Valve Repair:  The mitral valve was inspected and notable for fibroelastic deficiency type myxomatous degenerative disease.  The mitral annulus was dilated.  There was elongation of several primary chordae tendinae arising from the middle scallop (A2) of the anterior leaflet. The elongated cords were primarily associated with the head of the posterior papillary muscle.  Interrupted 2-0 Ethibond horizontal mattress sutures are placed circumferentially around the entire mitral valve annulus. The sutures will ultimately be utilized for ring annuloplasty, and at this juncture there are utilized to suspend the valve symmetrically.  Artificial neochord placement was performed using Chord-X multi-strand CV-4 Goretex pre-measured loops.  The appropriate cord length was measured from corresponding normal length primary cords from the A3 segment of the anterior leaflet. The papillary muscle suture of the Chord-X multi-strand suture was placed through the head of the posterior papillary muscle in a horizontal mattress fashion and tied over Teflon felt pledgets. Each of the three pre-measured loops were then reimplanted into the free margin of the A2 segment of the anterior leaflet.    The valve is tested with saline and appears reasonably  competent even prior to ring annuloplasty.  The valve is sized to accept a 28 mm annuloplasty ring based upon the distance between the left and right commissures, the height and the surface area of the anterior leaflet.  A Sorin Memo 3D annuloplasty ring (size 54mm, catalog # [id number], serial # [id number]) is implanted uneventfully.  All ring sutures were secured using a Cor-knot device.  The valve is again tested with saline and appears to be perfectly competent with a broad symmetrical line of coaptation of the anterior and posterior leaflet. There is no residual leak. Rewarming is begun.  The atriotomy was closed using a 2-layer closure of running 3-0 Prolene suture after placing a sump drain across the mitral valve to serve as a left ventricular vent.  One final dose of warm retrograde "reanimation dose" cardioplegia was administered retrograde through the coronary sinus catheter while all air was evacuated through the aortic root.  The aortic cross clamp was removed after a total cross clamp time of 126 minutes.   Maze Procedure (right atrial lesion set):  The retrograde cardioplegia cannula was removed and the small hole in the right atrium extended a short distance.  The AtriCure Synergy bipolar radiofrequency ablation clamp is utilized to create a series of linear lesions in the right atrium, each with one limb of the clamp along the endocardial surface and the other along the epicardial surface. The first lesion is placed from the posterior apex of the atriotomy incision and along the lateral wall of the right atrium to reach the lateral aspect of the superior vena cava. A second lesion is placed in the opposite direction from the posterior apex of the atriotomy incision along the lateral wall to reach the lateral aspect of the inferior vena cava. A third lesion is placed from the midportion of the atriotomy incision extending at a right angle to reach the tip of the right atrial appendage. A fourth  lesion is placed from the anterior apex of the atriotomy incision in an anterior and inferior direction to reach the acute margin of the heart. Finally, the cryotherapy probe is utilized to complete the right atrial lesion set by placing the probe along the endocardial surface of the right atrium from the anterior apex of the atriotomy incision to reach the tricuspid annulus at the 2:00 position. The atriotomy incision is closed with a 2 layer closure of running 4-0 Prolene suture.   Procedure Completion:  Epicardial pacing wires are fixed to the inferior wall of the right ventricule and to the right atrial appendage. The patient is rewarmed to 37C temperature. The left ventricular vent is removed.  The patient is ventilated and flow volumes turndown while the mitral valve repair is inspected using transesophageal echocardiogram. The valve repair appears intact with no residual leak. The antegrade cardioplegia cannula is now removed. The patient is weaned and disconnected from cardiopulmonary bypass.  The patient's rhythm at separation from bypass was AV paced.  The patient was weaned from bypass without any inotropic support.  Low-dose dopamine infusion was begun for renal perfusion.  Total cardiopulmonary bypass time for the operation was 187 minutes.  Followup transesophageal echocardiogram performed after separation from bypass revealed a well-seated annuloplasty ring in the mitral position with a normal functioning mitral valve. There was trace to mild residual leak with a very small jet located at the anterior commissure.  Left ventricular function was unchanged from preoperatively.  The femoral arterial and venous cannulae were removed uneventfully. There was a palpable  pulse in the distal right common femoral artery after removal of the cannula. Protamine was administered to reverse the anticoagulation. The right internal jugular cannula was removed and manual pressure held on the neck for 15  minutes.  Single lung ventilation was begun. The atriotomy closure was inspected for hemostasis. The pericardial sac was drained using a 28 French Bard drain placed through the anterior port incision.  The pericardium was closed using a patch of core matrix bovine submucosal tissue patch. The right pleural space is irrigated with saline solution and inspected for hemostasis. The right pleural space was drained using a 28 French Bard drain placed through the posterior port incision. The miniature thoracotomy incision was closed in multiple layers in routine fashion. The right groin incision was inspected for hemostasis and closed in multiple layers in routine fashion.  The post-bypass portion of the operation was notable for stable rhythm and hemodynamics.  No blood products were administered during the operation.   Disposition:  The patient tolerated the procedure well.  The patient was reintubated using a single lumen endotracheal tube and subsequently transported to the surgical intensive care unit in stable condition. There were no intraoperative complications. All sponge instrument and needle counts are verified correct at completion of the operation.    Valentina Gu. Roxy Manns MD 04/02/2017 2:01 PM

## 2017-04-02 NOTE — Progress Notes (Addendum)
  Echocardiogram Echocardiogram Transesophageal with 3D has been performed.  [name] [name] 04/02/2017, 9:27 AM

## 2017-04-02 NOTE — Progress Notes (Signed)
RT NOTE:  Cardiac Wean initiated.

## 2017-04-02 NOTE — Progress Notes (Signed)
Chem 8 rechecked as well as ABG. Improved after treatment modalities per dr Roxy Manns.  Awaking to command/voice. Moves all extremities. Attempted to wean but goes back to sleep with depressed respirations. Explained weaning to patient. CO improved. UOP good, chest tube drainage more serosanguinous, continues at 80 a hour. AV paced at 80 underlying rythym junctional at slow rate ( 40's)

## 2017-04-02 NOTE — Anesthesia Procedure Notes (Signed)
Procedure Name: Intubation Date/Time: 04/02/2017 2:18 PM Performed by: Orlie Dakin, CRNA Pre-anesthesia Checklist: Patient identified, Emergency Drugs available, Suction available, Patient being monitored and Timeout performed Patient Re-evaluated:Patient Re-evaluated prior to induction Oxygen Delivery Method: Circle system utilized Laryngoscope Size: Glidescope and 4 Grade View: Grade I Tube type: Oral Tube size: 8.0 mm Number of attempts: 1 Airway Equipment and Method: Bougie stylet and Video-laryngoscopy Placement Confirmation: ETT inserted through vocal cords under direct vision,  positive ETCO2 and breath sounds checked- equal and bilateral Secured at: 24 cm Tube secured with: Tape Dental Injury: Teeth and Oropharynx as per pre-operative assessment  Comments: Double lumen ETT exchanged over [place] for single lumen ETT.

## 2017-04-02 NOTE — Anesthesia Procedure Notes (Addendum)
Central Venous Catheter Insertion Performed by: Roberts Gaudy, MD, anesthesiologist Start/End2/28/2019 7:10 AM, 04/02/2017 7:15 AM Patient location: Pre-op. Preanesthetic checklist: patient identified, IV checked, site marked, risks and benefits discussed, surgical consent, monitors and equipment checked, pre-op evaluation, timeout performed and anesthesia consent Position: Trendelenburg Lidocaine 1% used for infiltration and patient sedated Hand hygiene performed  and maximum sterile barriers used  Catheter size: 8.5 Fr Sheath introducer Procedure performed using ultrasound guided technique. Ultrasound Notes:anatomy identified, needle tip was noted to be adjacent to the nerve/plexus identified, no ultrasound evidence of intravascular and/or intraneural injection and image(s) printed for medical record Attempts: 1 Following insertion, line sutured and dressing applied. Post procedure assessment: blood return through all ports, free fluid flow and no air  Patient tolerated the procedure well with no immediate complications.

## 2017-04-02 NOTE — Interval H&P Note (Signed)
History and Physical Interval Note:  04/02/2017 5:48 AM  [name] [name]  has presented today for surgery, with the diagnosis of MR AFIB  The various methods of treatment have been discussed with the patient and family. After consideration of risks, benefits and other options for treatment, the patient has consented to  Procedure(s): MINIMALLY INVASIVE MITRAL VALVE REPAIR (MVR) (Right) MINIMALLY INVASIVE MAZE PROCEDURE (N/A) TRANSESOPHAGEAL ECHOCARDIOGRAM (TEE) (N/A) as a surgical intervention .  The patient's history has been reviewed, patient examined, no change in status, stable for surgery.  I have reviewed the patient's chart and labs.  Questions were answered to the patient's satisfaction.     Rexene Alberts

## 2017-04-02 NOTE — Anesthesia Preprocedure Evaluation (Signed)
Anesthesia Evaluation  Patient identified by MRN, date of birth, ID band Patient awake    Reviewed: Allergy & Precautions, NPO status , Patient's Chart, lab work & pertinent test results  Airway Mallampati: II  TM Distance: >3 FB Neck ROM: Full    Dental  (+) Teeth Intact, Dental Advisory Given   Pulmonary former smoker,    breath sounds clear to auscultation       Cardiovascular hypertension,  Rhythm:Irregular Rate:Normal + Systolic murmurs    Neuro/Psych    GI/Hepatic   Endo/Other  diabetes  Renal/GU      Musculoskeletal   Abdominal   Peds  Hematology   Anesthesia Other Findings   Reproductive/Obstetrics                             Anesthesia Physical Anesthesia Plan  ASA: III  Anesthesia Plan: General   Post-op Pain Management:    Induction: Intravenous  PONV Risk Score and Plan: Ondansetron  Airway Management Planned: Oral ETT  Additional Equipment: Arterial line, CVP, PA Cath, 3D TEE and Ultrasound Guidance Line Placement  Intra-op Plan:   Post-operative Plan: Post-operative intubation/ventilation  Informed Consent: I have reviewed the patients History and Physical, chart, labs and discussed the procedure including the risks, benefits and alternatives for the proposed anesthesia with the patient or authorized representative who has indicated his/her understanding and acceptance.   Dental advisory given  Plan Discussed with: CRNA and Anesthesiologist  Anesthesia Plan Comments:         Anesthesia Quick Evaluation

## 2017-04-02 NOTE — Progress Notes (Signed)
Admitted to room 6 2H post operativly.

## 2017-04-02 NOTE — Transfer of Care (Signed)
Immediate Anesthesia Transfer of Care Note  Patient: [name] [name]  Procedure(s) Performed: MINIMALLY INVASIVE MITRAL VALVE REPAIR (MVR) with (Right Chest) MINIMALLY INVASIVE MAZE PROCEDURE (N/A ) TRANSESOPHAGEAL ECHOCARDIOGRAM (TEE) (N/A ) CLIPPING OF ATRIAL APPENDAGE (N/A Chest) PATENT FORAMEN OVALE (PFO) CLOSURE (N/A Chest)  Patient Location: ICU  Anesthesia Type:General  Level of Consciousness: unresponsive and Patient remains intubated per anesthesia plan  Airway & Oxygen Therapy: Patient remains intubated per anesthesia plan and Patient placed on Ventilator (see vital sign flow sheet for setting)  Post-op Assessment: Report given to RN and Post -op Vital signs reviewed and stable  Post vital signs: Reviewed and stable  Last Vitals:  Vitals:   04/02/17 0543  BP: (!) 151/77  Pulse: 62  Resp: 20  Temp: 36.8 C  SpO2: 95%    Last Pain: There were no vitals filed for this visit.    Patients Stated Pain Goal: 3 (84/21/03 1281)  Complications: No apparent anesthesia complications

## 2017-04-02 NOTE — Brief Op Note (Signed)
04/02/2017  1:54 PM  PATIENT:  [name] [name]  77 y.o. male  PRE-OPERATIVE DIAGNOSIS:  MR AFIB  POST-OPERATIVE DIAGNOSIS:  MR AFIB  PROCEDURE:  Procedure(s):  MINIMALLY INVASIVE MITRAL VALVE REPAIR  -Ring Annuloplasty with a 54 Sorin Memo 3D Ring -Placement of 6 Neochords to Anterior Leaflet A2  MINIMALLY INVASIVE MAZE PROCEDURE (N/A) -Complete Bi-Atrial Lesion Set with Cryothermy and Radiofrequency Ablation -Clipping of LA Appendage with a 45 mm Atricure ProClip2 -Oversew of LA Appendage  PATENT FORAMEN OVALE (PFO) CLOSURE (N/A)  TRANSESOPHAGEAL ECHOCARDIOGRAM (TEE) (N/A)  SURGEON:  Surgeon(s) and Role:    Rexene Alberts, MD - Primary  PHYSICIAN ASSISTANT: Erin Barrett PA-C  ANESTHESIA:   general  EBL:  400 mL   BLOOD ADMINISTERED:CELLSAVER  DRAINS: Right Pleural Chest Tubes   LOCAL MEDICATIONS USED:  NONE  SPECIMEN:  No Specimen  DISPOSITION OF SPECIMEN:  N/A  COUNTS:  YES  TOURNIQUET:  * No tourniquets in log *  DICTATION: .Dragon Dictation  PLAN OF CARE: Admit to inpatient   PATIENT DISPOSITION:  ICU - intubated and hemodynamically stable.   Delay start of Pharmacological VTE agent (>24hrs) due to surgical blood loss or risk of bleeding: yes

## 2017-04-02 NOTE — OR Nursing (Signed)
13:20 - 45 minute call to SICU charge nurse 14:00 - 20 minute call to SICU charge nurse

## 2017-04-03 ENCOUNTER — Inpatient Hospital Stay (HOSPITAL_COMMUNITY): Payer: Medicare Other

## 2017-04-03 ENCOUNTER — Encounter (HOSPITAL_COMMUNITY): Payer: Self-pay | Admitting: Thoracic Surgery (Cardiothoracic Vascular Surgery)

## 2017-04-03 LAB — CBC
HCT: 27 % — ABNORMAL LOW (ref 39.0–52.0)
HCT: 22.8 % — ABNORMAL LOW (ref 39.0–52.0)
Hemoglobin: 9.2 g/dL — ABNORMAL LOW (ref 13.0–17.0)
Hemoglobin: 7.6 g/dL — ABNORMAL LOW (ref 13.0–17.0)
MCH: 33.8 pg (ref 26.0–34.0)
MCH: 33.6 pg (ref 26.0–34.0)
MCHC: 34.1 g/dL (ref 30.0–36.0)
MCHC: 33.3 g/dL (ref 30.0–36.0)
MCV: 99.3 fL (ref 78.0–100.0)
MCV: 100.9 fL — ABNORMAL HIGH (ref 78.0–100.0)
PLATELETS: 112 10*3/uL — AB (ref 150–400)
PLATELETS: 100 10*3/uL — AB (ref 150–400)
RBC: 2.72 MIL/uL — AB (ref 4.22–5.81)
RBC: 2.26 MIL/uL — AB (ref 4.22–5.81)
RDW: 13.1 % (ref 11.5–15.5)
RDW: 13.5 % (ref 11.5–15.5)
WBC: 13.7 10*3/uL — AB (ref 4.0–10.5)
WBC: 9.9 10*3/uL (ref 4.0–10.5)

## 2017-04-03 LAB — GLUCOSE, CAPILLARY
GLUCOSE-CAPILLARY: 119 mg/dL — AB (ref 65–99)
GLUCOSE-CAPILLARY: 87 mg/dL (ref 65–99)
GLUCOSE-CAPILLARY: 98 mg/dL (ref 65–99)
GLUCOSE-CAPILLARY: 99 mg/dL (ref 65–99)
GLUCOSE-CAPILLARY: 112 mg/dL — AB (ref 65–99)
GLUCOSE-CAPILLARY: 170 mg/dL — AB (ref 65–99)
GLUCOSE-CAPILLARY: 137 mg/dL — AB (ref 65–99)
Glucose-Capillary: 113 mg/dL — ABNORMAL HIGH (ref 65–99)
Glucose-Capillary: 120 mg/dL — ABNORMAL HIGH (ref 65–99)
Glucose-Capillary: 129 mg/dL — ABNORMAL HIGH (ref 65–99)
Glucose-Capillary: 135 mg/dL — ABNORMAL HIGH (ref 65–99)
Glucose-Capillary: 171 mg/dL — ABNORMAL HIGH (ref 65–99)
Glucose-Capillary: 201 mg/dL — ABNORMAL HIGH (ref 65–99)
Glucose-Capillary: 80 mg/dL (ref 65–99)
Glucose-Capillary: 89 mg/dL (ref 65–99)

## 2017-04-03 LAB — POCT I-STAT 4, (NA,K, GLUC, HGB,HCT)
GLUCOSE: 108 mg/dL — AB (ref 65–99)
HEMATOCRIT: 31 % — AB (ref 39.0–52.0)
HEMOGLOBIN: 10.5 g/dL — AB (ref 13.0–17.0)
Potassium: 6.1 mmol/L — ABNORMAL HIGH (ref 3.5–5.1)
Sodium: 134 mmol/L — ABNORMAL LOW (ref 135–145)

## 2017-04-03 LAB — CREATININE, SERUM
CREATININE: 1.84 mg/dL — AB (ref 0.61–1.24)
GFR calc Af Amer: 39 mL/min — ABNORMAL LOW (ref >60)
GFR calc non Af Amer: 34 mL/min — ABNORMAL LOW (ref >60)

## 2017-04-03 LAB — POCT I-STAT, CHEM 8
BUN: 20 mg/dL (ref 6–20)
CALCIUM ION: 1.09 mmol/L — AB (ref 1.15–1.40)
Chloride: 95 mmol/L — ABNORMAL LOW (ref 101–111)
Creatinine, Ser: 1.7 mg/dL — ABNORMAL HIGH (ref 0.61–1.24)
GLUCOSE: 134 mg/dL — AB (ref 65–99)
HCT: 22 % — ABNORMAL LOW (ref 39.0–52.0)
Hemoglobin: 7.5 g/dL — ABNORMAL LOW (ref 13.0–17.0)
POTASSIUM: 4 mmol/L (ref 3.5–5.1)
Sodium: 134 mmol/L — ABNORMAL LOW (ref 135–145)
TCO2: 26 mmol/L (ref 22–32)

## 2017-04-03 LAB — BASIC METABOLIC PANEL
ANION GAP: 10 (ref 5–15)
BUN: 17 mg/dL (ref 6–20)
CHLORIDE: 102 mmol/L (ref 101–111)
CO2: 22 mmol/L (ref 22–32)
Calcium: 7.2 mg/dL — ABNORMAL LOW (ref 8.9–10.3)
Creatinine, Ser: 1.64 mg/dL — ABNORMAL HIGH (ref 0.61–1.24)
GFR calc Af Amer: 45 mL/min — ABNORMAL LOW (ref >60)
GFR, EST NON AFRICAN AMERICAN: 39 mL/min — AB (ref >60)
Glucose, Bld: 98 mg/dL (ref 65–99)
POTASSIUM: 5.3 mmol/L — AB (ref 3.5–5.1)
SODIUM: 134 mmol/L — AB (ref 135–145)

## 2017-04-03 LAB — MAGNESIUM
MAGNESIUM: 2.3 mg/dL (ref 1.7–2.4)
Magnesium: 2.2 mg/dL (ref 1.7–2.4)

## 2017-04-03 MED ORDER — ALPRAZOLAM 0.25 MG PO TABS
0.2500 mg | ORAL_TABLET | Freq: Every day | ORAL | Status: DC
  Administered 2017-04-03 – 2017-04-05 (×3): 0.25 mg via ORAL
  Filled 2017-04-03 (×3): qty 1

## 2017-04-03 MED ORDER — INSULIN ASPART 100 UNIT/ML ~~LOC~~ SOLN
0.0000 [IU] | SUBCUTANEOUS | Status: DC
Start: 2017-04-03 — End: 2017-04-03

## 2017-04-03 MED ORDER — ORAL CARE MOUTH RINSE
15.0000 mL | Freq: Two times a day (BID) | OROMUCOSAL | Status: DC
  Administered 2017-04-05 – 2017-04-09 (×4): 15 mL via OROMUCOSAL

## 2017-04-03 MED ORDER — ENOXAPARIN SODIUM 40 MG/0.4ML ~~LOC~~ SOLN
40.0000 mg | Freq: Every day | SUBCUTANEOUS | Status: DC
Start: 2017-04-04 — End: 2017-04-09
  Administered 2017-04-04 – 2017-04-08 (×5): 40 mg via SUBCUTANEOUS
  Filled 2017-04-03 (×5): qty 0.4

## 2017-04-03 MED ORDER — SERTRALINE HCL 50 MG PO TABS
50.0000 mg | ORAL_TABLET | Freq: Every day | ORAL | Status: DC
  Administered 2017-04-03 – 2017-04-05 (×3): 50 mg via ORAL
  Filled 2017-04-03 (×3): qty 1

## 2017-04-03 MED ORDER — ORAL CARE MOUTH RINSE: 15.0000 mL | Freq: Two times a day (BID) | OROMUCOSAL | Status: DC

## 2017-04-03 MED ORDER — FUROSEMIDE 10 MG/ML IJ SOLN
40.0000 mg | Freq: Two times a day (BID) | INTRAMUSCULAR | Status: DC
  Administered 2017-04-03 – 2017-04-06 (×6): 40 mg via INTRAVENOUS
  Filled 2017-04-03 (×6): qty 4

## 2017-04-03 MED ORDER — FUROSEMIDE 10 MG/ML IJ SOLN
40.0000 mg | Freq: Once | INTRAMUSCULAR | Status: AC
  Administered 2017-04-03: 40 mg via INTRAVENOUS
  Filled 2017-04-03: qty 4

## 2017-04-03 MED ORDER — INSULIN ASPART 100 UNIT/ML ~~LOC~~ SOLN
0.0000 [IU] | SUBCUTANEOUS | Status: DC
  Administered 2017-04-03: 8 [IU] via SUBCUTANEOUS
  Administered 2017-04-03 (×2): 2 [IU] via SUBCUTANEOUS
  Administered 2017-04-03: 4 [IU] via SUBCUTANEOUS

## 2017-04-03 MED ORDER — INSULIN DETEMIR 100 UNIT/ML ~~LOC~~ SOLN
15.0000 [IU] | Freq: Once | SUBCUTANEOUS | Status: AC
  Administered 2017-04-03: 15 [IU] via SUBCUTANEOUS
  Filled 2017-04-03: qty 0.15

## 2017-04-03 MED ORDER — INSULIN DETEMIR 100 UNIT/ML ~~LOC~~ SOLN
15.0000 [IU] | Freq: Every day | SUBCUTANEOUS | Status: DC
  Filled 2017-04-03: qty 0.15

## 2017-04-03 NOTE — Progress Notes (Addendum)
[facility] DAILY ICU PROGRESS NOTE                   [address]            [place] [address]          [phone]   1 Day Post-Op Procedure(s) (LRB): MINIMALLY INVASIVE MITRAL VALVE REPAIR (MVR) with (Right) MINIMALLY INVASIVE MAZE PROCEDURE (N/A) TRANSESOPHAGEAL ECHOCARDIOGRAM (TEE) (N/A) CLIPPING OF ATRIAL APPENDAGE (N/A) PATENT FORAMEN OVALE (PFO) CLOSURE (N/A)  Total Length of Stay:  LOS: 1 day   Subjective:  [name] states he is feeling better than he thought he would.  He denies pain, N/V  Objective: Vital signs in last 24 hours: Temp:  [96.1 F (35.6 C)-99.1 F (37.3 C)] 99 F (37.2 C) (03/01 0700) Pulse Rate:  [77-81] 80 (03/01 0700) Cardiac Rhythm: A-V Sequential paced (03/01 0400) Resp:  [12-26] 16 (03/01 0700) BP: (91-127)/(53-77) 107/77 (03/01 0700) SpO2:  [95 %-100 %] 100 % (03/01 0700) Arterial Line BP: (92-141)/(47-61) 127/54 (03/01 0700) FiO2 (%):  [40 %-50 %] 40 % (02/28 2143) Weight:  [196 lb 10.4 oz (89.2 kg)] 196 lb 10.4 oz (89.2 kg) (03/01 0515)  Filed Weights   04/02/17 0551 04/03/17 0515  Weight: 187 lb (84.8 kg) 196 lb 10.4 oz (89.2 kg)    Weight change: 9 lb 10.4 oz (4.377 kg)   Hemodynamic parameters for last 24 hours: PAP: (31-44)/(12-27) 39/23 CO:  [3.1 L/min-5.6 L/min] 5.1 L/min CI:  [1.6 L/min/m2-2.9 L/min/m2] 2.7 L/min/m2  Intake/Output from previous day: 02/28 0701 - 03/01 0700 In: 6747.5 [I.V.:5112.5; Blood:505; IV VOZDGUYQI:3474] Out: 2595 [Urine:2760; Blood:825; Chest Tube:930]  Current Meds: Scheduled Meds: . acetaminophen (TYLENOL) oral liquid 160 mg/5 mL  650 mg Per Tube Once   Or  . acetaminophen  650 mg Rectal Once  . acetaminophen  1,000 mg Oral Q6H  . aspirin EC  325 mg Oral Daily  . bisacodyl  10 mg Oral Daily   Or  . bisacodyl  10 mg Rectal Daily  . Chlorhexidine Gluconate Cloth  6 each Topical Daily  . docusate sodium  200 mg Oral Daily  . insulin aspart  0-24 Units Subcutaneous Q4H  .  mouth rinse  15 mL Mouth Rinse BID  . [START ON 04/04/2017] pantoprazole  40 mg Oral Daily  . sodium chloride flush  10-40 mL Intracatheter Q12H  . sodium chloride flush  3 mL Intravenous Q12H   Continuous Infusions: . sodium chloride    . albumin human    . DOPamine 3.019 mcg/kg/min (04/03/17 0700)  . lactated ringers 20 mL/hr at 04/03/17 0700  . lactated ringers 20 mL/hr at 04/02/17 1600  . levofloxacin (LEVAQUIN) IV    . nitroGLYCERIN    . phenylephrine (NEO-SYNEPHRINE) Adult infusion 10 mcg/min (04/03/17 0750)   PRN Meds:.albumin human, metoprolol tartrate, morphine injection, ondansetron (ZOFRAN) IV, oxyCODONE, sodium chloride flush, sodium chloride flush, traMADol  General appearance: alert, cooperative and no distress Heart: regular rate and rhythm Lungs: clear to auscultation bilaterally Abdomen: soft, non-tender; bowel sounds normal; no masses,  no organomegaly Extremities: edema trace Wound: clean and dry  Lab Results: CBC: Recent Labs    04/02/17 2055 04/03/17 0347  WBC 13.0* 13.7*  HGB 10.1* 9.2*  HCT 30.0* 27.0*  PLT 120* 112*   BMET:  Recent Labs    03/31/17 0847  04/02/17 2049 04/02/17 2055 04/03/17 0552  NA 134*   < > 135  --  134*  K  4.2   < > 4.7  --  5.3*  CL 100*   < > 99*  --  102  CO2 21*  --   --   --  22  GLUCOSE 121*   < > 122*  --  98  BUN 11   < > 15  --  17  CREATININE 1.35*   < > 1.30* 1.53* 1.64*  CALCIUM 8.8*  --   --   --  7.2*   < > = values in this interval not displayed.    CMET: Lab Results  Component Value Date   WBC 13.7 (H) 04/03/2017   HGB 9.2 (L) 04/03/2017   HCT 27.0 (L) 04/03/2017   PLT 112 (L) 04/03/2017   GLUCOSE 98 04/03/2017   ALT 20 03/31/2017   AST 28 03/31/2017   NA 134 (L) 04/03/2017   K 5.3 (H) 04/03/2017   CL 102 04/03/2017   CREATININE 1.64 (H) 04/03/2017   BUN 17 04/03/2017   CO2 22 04/03/2017   TSH 1.910 02/10/2017   INR 1.64 04/02/2017   HGBA1C 6.3 (H) 03/31/2017      PT/INR:  Recent  Labs    04/02/17 1500  LABPROT 19.3*  INR 1.64   Radiology: Dg Chest Port 1 View  Result Date: 04/03/2017 CLINICAL DATA:  Atelectasis. EXAM: PORTABLE CHEST 1 VIEW COMPARISON:  04/02/2017, 12/02/2011.  CT 03/20/2017. FINDINGS: Interim extubation removal of NG tube. Swan-Ganz catheter and left IJ line stable position. Right chest tubes in stable position. Stable cardiomegaly. Prior cardiac valve replacement. Epicardial pacing wires noted. Cardiomegaly with mild bilateral interstitial prominence and small pleural effusions suggesting mild CHF. Low lung volumes with basilar atelectasis. Stable deformity of the left scapula. Old left rib fractures. Mild right chest wall subcutaneous emphysema. IMPRESSION: 1. Interim extubation and removal of NG tube. Right chest tubes, Swan-Ganz catheter, left IJ line stable position. No pneumothorax. 2. Prior cardiac valve replacement. Cardiomegaly with mild bilateral interstitial prominence and small bilateral pleural effusions suggesting mild CHF. Low lung volumes with basilar atelectasis. Electronically Signed   By: Marcello Moores  Register   On: 04/03/2017 07:44   Dg Chest Port 1 View  Result Date: 04/02/2017 CLINICAL DATA:  Status post mitral valve repair EXAM: PORTABLE CHEST 1 VIEW COMPARISON:  03/31/2017, CT chest 03/20/2017 FINDINGS: Endotracheal tube tip about 4 cm superior to the carina. Esophageal tube tip below the diaphragm. Right-sided chest tubes with tips projecting over the right apex and lower mediastinum. Valvular prosthesis. Left-sided central venous catheter tip overlies the brachiocephalic vein. Left-sided jugular Swan-Ganz catheter tip over right central pulmonary artery. No definitive pneumothorax is seen. Trace pleural effusion and streaky atelectasis at the bases and right upper lobe. Cardiomediastinal silhouette within normal limits. Aortic atherosclerosis. Old left-sided rib fracture. Chronic appearing left scapular deformity. IMPRESSION: 1. Placement of  support lines and tubes as above with postoperative changes. Endotracheal tube tip about 4 cm superior to carina 2. Tiny pleural effusions with left basilar and right upper lobe subsegmental atelectasis. Electronically Signed   By: Donavan Foil M.D.   On: 04/02/2017 15:36     Assessment/Plan: S/P Procedure(s) (LRB): MINIMALLY INVASIVE MITRAL VALVE REPAIR (MVR) with (Right) MINIMALLY INVASIVE MAZE PROCEDURE (N/A) TRANSESOPHAGEAL ECHOCARDIOGRAM (TEE) (N/A) CLIPPING OF ATRIAL APPENDAGE (N/A) PATENT FORAMEN OVALE (PFO) CLOSURE (N/A)  1. CV- NSR with 1st degree AV Block under pacer- on Neo, Dopamine which we will wean as tolerated, no BB at this time 2. Pulm- weaning oxygen as tolerated, CXR with atelectasis,  mild pulmonary edema, continue IS 3. Renal- creatinine at 1.64, which is baseline, mild hypervolemia on exam, K at 5.3.. No supplement at this time 4. Expected post operative blood loss anemia, mild hgb at 9.2 5. Thrombocytopenia, expected.. At 112, will monitor 6. DM- off insulin drip, will start low dose Levemir, continue SSIP 7. Dispo- patient stable, watch creatinine/K, wean drips as tolerated, POD #1 progression orders    Ellwood Handler 04/03/2017 7:58 AM    I have seen and examined the patient and agree with the assessment and plan as outlined.  Doing very well POD1.  Rhythm looks junctional under pacer but will not AAI pace.  Continue DDD pacing for now.  Mobilize.  Continue renal dose dopamine for now.  Hold diuretics until BP improved. D/C lines.  Start Coumadin  Rexene Alberts, MD 04/03/2017 9:24 AM

## 2017-04-03 NOTE — Progress Notes (Signed)
      [address]       [place] [address]             [phone]      POD # 1 MVR maze BP (!) 99/57   Pulse 80   Temp 98.5 F (36.9 C) (Oral)   Resp 16   Ht 5\' 5"  (1.651 m)   Wt 196 lb 10.4 oz (89.2 kg)   SpO2 100%   BMI 32.72 kg/m   Intake/Output Summary (Last 24 hours) at 04/03/2017 1752 Last data filed at 04/03/2017 1600 Gross per 24 hour  Intake 2681.23 ml  Output 3145 ml  Net -463.77 ml   CBG a little high- continue SSI  Janeil Schexnayder C. Roxan Hockey, MD [facility] [phone]

## 2017-04-04 ENCOUNTER — Other Ambulatory Visit: Payer: Self-pay

## 2017-04-04 ENCOUNTER — Inpatient Hospital Stay (HOSPITAL_COMMUNITY): Payer: Medicare Other

## 2017-04-04 LAB — GLUCOSE, CAPILLARY
GLUCOSE-CAPILLARY: 91 mg/dL (ref 65–99)
Glucose-Capillary: 85 mg/dL (ref 65–99)
Glucose-Capillary: 79 mg/dL (ref 65–99)
Glucose-Capillary: 176 mg/dL — ABNORMAL HIGH (ref 65–99)
Glucose-Capillary: 112 mg/dL — ABNORMAL HIGH (ref 65–99)

## 2017-04-04 LAB — BASIC METABOLIC PANEL
Anion gap: 8 (ref 5–15)
BUN: 21 mg/dL — AB (ref 6–20)
CHLORIDE: 99 mmol/L — AB (ref 101–111)
CO2: 25 mmol/L (ref 22–32)
CREATININE: 1.7 mg/dL — AB (ref 0.61–1.24)
Calcium: 7.5 mg/dL — ABNORMAL LOW (ref 8.9–10.3)
GFR calc non Af Amer: 37 mL/min — ABNORMAL LOW (ref >60)
GFR, EST AFRICAN AMERICAN: 43 mL/min — AB (ref >60)
Glucose, Bld: 77 mg/dL (ref 65–99)
Potassium: 3.8 mmol/L (ref 3.5–5.1)
SODIUM: 132 mmol/L — AB (ref 135–145)

## 2017-04-04 LAB — CBC
HCT: 22.2 % — ABNORMAL LOW (ref 39.0–52.0)
Hemoglobin: 7.6 g/dL — ABNORMAL LOW (ref 13.0–17.0)
MCH: 34.7 pg — AB (ref 26.0–34.0)
MCHC: 34.2 g/dL (ref 30.0–36.0)
MCV: 101.4 fL — AB (ref 78.0–100.0)
PLATELETS: 99 10*3/uL — AB (ref 150–400)
RBC: 2.19 MIL/uL — AB (ref 4.22–5.81)
RDW: 13.7 % (ref 11.5–15.5)
WBC: 9.8 10*3/uL (ref 4.0–10.5)

## 2017-04-04 MED ORDER — WARFARIN - PHYSICIAN DOSING INPATIENT
Freq: Every day | Status: DC
  Administered 2017-04-05: 1

## 2017-04-04 MED ORDER — ALPRAZOLAM 0.25 MG PO TABS
0.2500 mg | ORAL_TABLET | Freq: Four times a day (QID) | ORAL | Status: DC | PRN
  Administered 2017-04-04: 0.25 mg via ORAL
  Filled 2017-04-04: qty 1

## 2017-04-04 MED ORDER — WARFARIN SODIUM 2.5 MG PO TABS
2.5000 mg | ORAL_TABLET | Freq: Every day | ORAL | Status: DC
  Administered 2017-04-04 – 2017-04-08 (×4): 2.5 mg via ORAL
  Filled 2017-04-04 (×4): qty 1

## 2017-04-04 MED ORDER — WARFARIN VIDEO
Freq: Once | Status: AC
  Administered 2017-04-04: 16:00:00

## 2017-04-04 MED ORDER — PATIENT'S GUIDE TO USING COUMADIN BOOK
Freq: Once | Status: AC
  Administered 2017-04-04: 17:00:00
  Filled 2017-04-04: qty 1

## 2017-04-04 MED ORDER — INSULIN ASPART 100 UNIT/ML ~~LOC~~ SOLN
0.0000 [IU] | Freq: Three times a day (TID) | SUBCUTANEOUS | Status: DC
  Administered 2017-04-04: 3 [IU] via SUBCUTANEOUS
  Administered 2017-04-05 – 2017-04-09 (×5): 2 [IU] via SUBCUTANEOUS

## 2017-04-04 NOTE — Progress Notes (Signed)
2 Days Post-Op Procedure(s) (LRB): MINIMALLY INVASIVE MITRAL VALVE REPAIR (MVR) with (Right) MINIMALLY INVASIVE MAZE PROCEDURE (N/A) TRANSESOPHAGEAL ECHOCARDIOGRAM (TEE) (N/A) CLIPPING OF ATRIAL APPENDAGE (N/A) PATENT FORAMEN OVALE (PFO) CLOSURE (N/A) Subjective: Denies pain  Objective: Vital signs in last 24 hours: Temp:  [97.8 F (36.6 C)-98.8 F (37.1 C)] 97.8 F (36.6 C) (03/02 0812) Pulse Rate:  [78-99] 80 (03/02 0700) Cardiac Rhythm: Ventricular paced (03/02 0400) Resp:  [10-23] 10 (03/02 0700) BP: (91-156)/(38-113) 114/69 (03/02 0700) SpO2:  [84 %-100 %] 96 % (03/02 0700) Arterial Line BP: (114-149)/(47-54) 149/54 (03/01 0900) Weight:  [194 lb (88 kg)] 194 lb (88 kg) (03/02 0600)  Hemodynamic parameters for last 24 hours: PAP: (35-44)/(20-24) 44/24  Intake/Output from previous day: 03/01 0701 - 03/02 0700 In: 2003.3 [P.O.:1200; I.V.:403.3; IV Piggyback:400] Out: 2500 [Urine:1050; Chest Tube:790] Intake/Output this shift: No intake/output data recorded.  General appearance: alert, cooperative and no distress Neurologic: intact Heart: regular rate and rhythm Lungs: diminished breath sounds left base Abdomen: normal findings: soft, non-tender serous drainage from CT  Lab Results: Recent Labs    04/03/17 1756 04/03/17 1802 04/04/17 0449  WBC 9.9  --  9.8  HGB 7.6* 7.5* 7.6*  HCT 22.8* 22.0* 22.2*  PLT 100*  --  99*   BMET:  Recent Labs    04/03/17 0552  04/03/17 1802 04/04/17 0449  NA 134*  --  134* 132*  K 5.3*  --  4.0 3.8  CL 102  --  95* 99*  CO2 22  --   --  25  GLUCOSE 98  --  134* 77  BUN 17  --  20 21*  CREATININE 1.64*   < > 1.70* 1.70*  CALCIUM 7.2*  --   --  7.5*   < > = values in this interval not displayed.    PT/INR:  Recent Labs    04/02/17 1500  LABPROT 19.3*  INR 1.64   ABG    Component Value Date/Time   PHART 7.330 (L) 04/02/2017 2303   HCO3 25.8 04/02/2017 2303   TCO2 26 04/03/2017 1802   ACIDBASEDEF 3.0 (H)  04/02/2017 2202   O2SAT 97.0 04/02/2017 2303   CBG (last 3)  Recent Labs    04/03/17 1949 04/03/17 2339 04/04/17 0330  GLUCAP 137* 80 85    Assessment/Plan: S/P Procedure(s) (LRB): MINIMALLY INVASIVE MITRAL VALVE REPAIR (MVR) with (Right) MINIMALLY INVASIVE MAZE PROCEDURE (N/A) TRANSESOPHAGEAL ECHOCARDIOGRAM (TEE) (N/A) CLIPPING OF ATRIAL APPENDAGE (N/A) PATENT FORAMEN OVALE (PFO) CLOSURE (N/A) -NEURO- intact  CV- narrow complex rhythm at 60- hard to see P wave on monitor  Pacer to VVI at 50  Beta blocker on hold  RESP- has some LLL atelectasis +/- effusion on CXR  IS  RENAL- creatinine stable, continue diuresis  Wean dopamine as BP allows  ENDO- CBG stable. Will dc levemir, change SSI to [facility] and HS  On enoxaparin until INR up  Mobilize- PT has been consulted   LOS: 2 days    Melrose Nakayama 04/04/2017

## 2017-04-04 NOTE — Progress Notes (Signed)
      [address]       [place] [address]             [phone]      Some mild agitation earlier today Drinks a couple of drinks a day at home Low dose xanax ordered PRN  Otherwise stable  Remo Lipps C. Roxan Hockey, MD [facility] [phone]

## 2017-04-04 NOTE — Evaluation (Signed)
Physical Therapy Evaluation Patient Details Name: [name] [name] MRN: [id number] DOB: 11-06-1940 Today's Date: 04/04/2017   History of Present Illness  Pt s/p minimally invasive MVR, MAZE procedure, clipping of atrial appendage, and PFO closure on 04/03/27. PMH - mitral regurgitation, afib, DM, HTN, TIA  Clinical Impression  Pt presents to PT with significant limitations in mobility primarily due to shaking/tremors of all extremities in standing or when attempting to ambulate. Wife reports he has been having these episodes at home and they have been getting worse and pt has been falling at least 1x/day. On first attempt at amb with me pt amb about 89' and then began having significant shaking/tremors of all extremities and c/o dizziness. Pt took a few more steps and then had to sit in recliner that wife was pushing behind [place]. BP after sitting 2-3 minutes was 90's/50's. BP taken an hour prior was 130's/60's. Tremors subsided and then stopped after several minutes of sitting.  Stood again to attempt to get a standing BP and pt began having tremors again after 30-40 sec and had to sit prior to BP reading. When BP finished it was 90/50's. Tried standing BP a third time with basically the same results. After sitting each time tremors continue for 2-3 minutes. There seems to be a postural component but unable to get an actual standing BP. From what I observed this does not appear to be a weakness or balance issue. Recommend CIR consult for further therapy.    Follow Up Recommendations CIR    Equipment Recommendations  Other (comment)(To be determined)    Recommendations for Other Services       Precautions / Restrictions Precautions Precautions: Fall Restrictions Weight Bearing Restrictions: No      Mobility  Bed Mobility               General bed mobility comments: Pt up in chair  Transfers Overall transfer level: Needs assistance Equipment used: 4-wheeled walker Transfers:  Sit to/from Stand Sit to Stand: Min assist;+2 safety/equipment;Mod assist(and extra person to have chair right behind pt)         General transfer comment: min assist for balance and support when coming to stand. Mod assist to slow descent returning to sitting due to shaking/tremors  Ambulation/Gait Ambulation/Gait assistance: Mod assist;+2 safety/equipment(3rd person to follow closely with chair.) Ambulation Distance (Feet): 15 Feet Assistive device: 4-wheeled walker Gait Pattern/deviations: Step-through pattern;Decreased step length - right;Decreased step length - left;Shuffle;Trunk flexed Gait velocity: decr Gait velocity interpretation: Below normal speed for age/gender General Gait Details: Initially pt min assist for balance and support. After 12-13' pt began having shaking/tremors in all 4 extremities and c/o dizziness. Required incr assistance and had to sit in chair following him.  Stairs            Wheelchair Mobility    Modified Rankin (Stroke Patients Only)       Balance Overall balance assessment: Needs assistance Sitting-balance support: Feet supported;Bilateral upper extremity supported Sitting balance-Leahy Scale: Poor Sitting balance - Comments: If not having shaking/tremors pt doesn't require assist for static sitting. If having shaking/tremors pt requires min guard for safety.   Standing balance support: Bilateral upper extremity supported Standing balance-Leahy Scale: Poor Standing balance comment: Initially min assist for static standing but when shaking/tremors start pt requires mod assist and then has to sit.  Pertinent Vitals/Pain Pain Assessment: Faces Faces Pain Scale: Hurts little more Pain Location: rt shoulder and buttocks Pain Descriptors / Indicators: Grimacing Pain Intervention(s): Limited activity within patient's tolerance;Monitored during session    [place] expects to be  discharged to:: Private residence Living Arrangements: Spouse/significant other Available Help at Discharge: Family;Available PRN/intermittently(spouse works) Type of Home: House Home Access: Stairs to enter Entrance Stairs-Rails: Right Entrance Stairs-Number of Steps: 6 Home Layout: Multi-level;Able to live on main level with bedroom/bathroom Home Equipment: Cane - single point      Prior Function Level of Independence: Needs assistance   Gait / Transfers Assistance Needed: Amb with cane but with frequent falls due to shaking of legs/dizziness. Wife reports at least 1 fall per day.           Hand Dominance   Dominant Hand: Left    Extremity/Trunk Assessment   Upper Extremity Assessment Upper Extremity Assessment: Defer to OT evaluation    Lower Extremity Assessment Lower Extremity Assessment: Generalized weakness       Communication   Communication: No difficulties  Cognition Arousal/Alertness: Awake/alert Behavior During Therapy: Impulsive;Anxious Overall Cognitive Status: Impaired/Different from baseline Area of Impairment: Attention;Memory;Safety/judgement;Problem solving                   Current Attention Level: Sustained Memory: Decreased short-term memory;Decreased recall of precautions   Safety/Judgement: Decreased awareness of safety;Decreased awareness of deficits   Problem Solving: Requires verbal cues;Requires tactile cues        General Comments      Exercises     Assessment/Plan    PT Assessment Patient needs continued PT services  PT Problem List Decreased strength;Decreased activity tolerance;Decreased balance;Decreased mobility;Decreased cognition;Decreased knowledge of use of DME;Decreased safety awareness       PT Treatment Interventions DME instruction;Gait training;Stair training;Functional mobility training;Therapeutic activities;Therapeutic exercise;Balance training;Cognitive remediation;Patient/family education    PT  Goals (Current goals can be found in the Care Plan section)  Acute Rehab PT Goals Patient Stated Goal: Pt and wife want to figure out why he is having these episodes of shaking/tremors PT Goal Formulation: With patient/family Time For Goal Achievement: 04/18/17 Potential to Achieve Goals: Fair    Frequency Min 3X/week   Barriers to discharge Decreased caregiver support;Inaccessible home environment stairs to enter and wife works    Co-evaluation               AM-PAC PT "6 Clicks" Daily Activity  Outcome Measure Difficulty turning over in bed (including adjusting bedclothes, sheets and blankets)?: Unable Difficulty moving from lying on back to sitting on the side of the bed? : Unable Difficulty sitting down on and standing up from a chair with arms (e.g., wheelchair, bedside commode, etc,.)?: Unable Help needed moving to and from a bed to chair (including a wheelchair)?: A Little Help needed walking in hospital room?: A Lot Help needed climbing 3-5 steps with a railing? : Total 6 Click Score: 9    End of Session Equipment Utilized During Treatment: Gait belt Activity Tolerance: Other (comment)(Limited by shaking/tremors) Patient left: in chair;with call bell/phone within reach;with chair alarm set;with family/visitor present Nurse Communication: Mobility status(Nurse assisted with mobility) PT Visit Diagnosis: Unsteadiness on feet (R26.81);Other abnormalities of gait and mobility (R26.89);Repeated falls (R29.6);Difficulty in walking, not elsewhere classified (R26.2)    Time: 0175-1025 PT Time Calculation (min) (ACUTE ONLY): 34 min   Charges:   PT Evaluation $PT Eval High Complexity: 1 High PT Treatments $Gait Training: 8-22 mins   PT  G CodesSuanne Marker PT [place] 04/04/2017, 2:53 PM

## 2017-04-04 NOTE — Plan of Care (Signed)
  Education: Knowledge of General Education information will improve 04/04/2017 1633 - Progressing by Rafael Bihari, RN Patient able to verbalize need for assistance with ADLs & medication management   Activity: Risk for activity intolerance will decrease 04/04/2017 1633 - Progressing by Rafael Bihari, RN PT consult and patient able to ambulate to doorway.  PT rec's IR consult for rehab   Coping: Level of anxiety will decrease 04/04/2017 1633 - Not Progressing by Rafael Bihari, RN Patient became agitated and anxious, wanting to get car keys and leave hospital.  PRN xanax initiated.    Pain Managment: General experience of comfort will improve 04/04/2017 1633 - Progressing by Rafael Bihari, RN  Patient's pain adequately managed with scheduled tylenol and prn tramadol  Education: Knowledge of the prescribed therapeutic regimen will improve 04/04/2017 1633 - Progressing by Rafael Bihari, RN Patient and patient's wife watched Coumadin video and given Coumadin book   Cardiac: Hemodynamic stability will improve 04/04/2017 1633 - Progressing by Rafael Bihari, RN Able to titrate dopamine down while maintaining BP parameters   Respiratory: Respiratory status will improve 04/04/2017 1633 - Progressing by Rafael Bihari, RN  Patient able to pull 1250 on IS

## 2017-04-04 NOTE — Progress Notes (Signed)
Anesthesiology Follow-up:  Awake and alert, neuro intact, sitting in chair, breathing unlabored, minimal pain.  VS: T- 36.6 BP- 116/66 HR- 60 RR-19 O2 Sat 100 on RA  K-3.2 Na 132 glucose- 134 BUN/Cr. 21/1.70 H/H- 7.6/22.2 Platelets- 99,000  Extubated 7 1/2 hours post-op  77 year old male with H/O severe MR and persistent Afib, now 2 days S/P minimally invasive MV repair and Maze procedure.  Creatinine mildly elevated from baseline and he appears to be in nodal rhythm at present. Otherwise doing well.  Roberts Gaudy

## 2017-04-04 NOTE — Addendum Note (Signed)
Addendum  created 04/04/17 0951 by Roberts Gaudy, MD   Sign clinical note

## 2017-04-04 NOTE — Progress Notes (Signed)
Inpatient Rehabilitation  Per PT request, patient was screened by Annina Piotrowski for appropriateness for an Inpatient Acute Rehab consult.  At this time we are recommending an Inpatient Rehab consult.  Please order if you are agreeable.    Antony Sian, M.A., CCC/SLP Admission Coordinator  [facility]  Cell [phone]  

## 2017-04-05 ENCOUNTER — Inpatient Hospital Stay (HOSPITAL_COMMUNITY): Payer: Medicare Other

## 2017-04-05 LAB — BASIC METABOLIC PANEL
ANION GAP: 6 (ref 5–15)
BUN: 20 mg/dL (ref 6–20)
CALCIUM: 7.5 mg/dL — AB (ref 8.9–10.3)
CO2: 27 mmol/L (ref 22–32)
Chloride: 98 mmol/L — ABNORMAL LOW (ref 101–111)
Creatinine, Ser: 1.63 mg/dL — ABNORMAL HIGH (ref 0.61–1.24)
GFR calc non Af Amer: 39 mL/min — ABNORMAL LOW (ref >60)
GFR, EST AFRICAN AMERICAN: 46 mL/min — AB (ref >60)
Glucose, Bld: 99 mg/dL (ref 65–99)
POTASSIUM: 3.5 mmol/L (ref 3.5–5.1)
SODIUM: 131 mmol/L — AB (ref 135–145)

## 2017-04-05 LAB — CBC
HCT: 20.7 % — ABNORMAL LOW (ref 39.0–52.0)
Hemoglobin: 7.1 g/dL — ABNORMAL LOW (ref 13.0–17.0)
MCH: 34.6 pg — ABNORMAL HIGH (ref 26.0–34.0)
MCHC: 34.3 g/dL (ref 30.0–36.0)
MCV: 101 fL — ABNORMAL HIGH (ref 78.0–100.0)
PLATELETS: 96 10*3/uL — AB (ref 150–400)
RBC: 2.05 MIL/uL — AB (ref 4.22–5.81)
RDW: 13.6 % (ref 11.5–15.5)
WBC: 6.4 10*3/uL (ref 4.0–10.5)

## 2017-04-05 LAB — PREPARE RBC (CROSSMATCH)

## 2017-04-05 LAB — GLUCOSE, CAPILLARY
GLUCOSE-CAPILLARY: 87 mg/dL (ref 65–99)
GLUCOSE-CAPILLARY: 140 mg/dL — AB (ref 65–99)
Glucose-Capillary: 162 mg/dL — ABNORMAL HIGH (ref 65–99)
Glucose-Capillary: 173 mg/dL — ABNORMAL HIGH (ref 65–99)

## 2017-04-05 LAB — HEMOGLOBIN AND HEMATOCRIT, BLOOD
HCT: 25.9 % — ABNORMAL LOW (ref 39.0–52.0)
HEMOGLOBIN: 9 g/dL — AB (ref 13.0–17.0)

## 2017-04-05 MED ORDER — PHENOL 1.4 % MT LIQD: 1.0000 | OROMUCOSAL | Status: DC | PRN

## 2017-04-05 MED ORDER — SODIUM CHLORIDE 0.9 % IV SOLN: Freq: Once | INTRAVENOUS | Status: DC

## 2017-04-05 MED ORDER — POTASSIUM CHLORIDE 10 MEQ/50ML IV SOLN
10.0000 meq | INTRAVENOUS | Status: AC
  Administered 2017-04-05 (×3): 10 meq via INTRAVENOUS
  Filled 2017-04-05 (×3): qty 50

## 2017-04-05 NOTE — Progress Notes (Signed)
C/o pain from cuff on left arm, took it off. Kept it off for patient comfort, as VS have been stable. No new sx or dizziness.

## 2017-04-05 NOTE — Progress Notes (Signed)
3 Days Post-Op Procedure(s) (LRB): MINIMALLY INVASIVE MITRAL VALVE REPAIR (MVR) with (Right) MINIMALLY INVASIVE MAZE PROCEDURE (N/A) TRANSESOPHAGEAL ECHOCARDIOGRAM (TEE) (N/A) CLIPPING OF ATRIAL APPENDAGE (N/A) PATENT FORAMEN OVALE (PFO) CLOSURE (N/A) Subjective: Wants to go home Some back pain and right shoulder pain  Objective: Vital signs in last 24 hours: Temp:  [97.8 F (36.6 C)-98.7 F (37.1 C)] 98 F (36.7 C) (03/03 0737) Pulse Rate:  [52-61] 57 (03/03 0600) Cardiac Rhythm: Junctional rhythm (03/03 0400) Resp:  [14-22] 19 (03/03 0600) BP: (89-138)/(45-76) 110/52 (03/03 0600) SpO2:  [92 %-100 %] 100 % (03/03 0600) Weight:  [194 lb 0.1 oz (88 kg)] 194 lb 0.1 oz (88 kg) (03/03 0600)  Hemodynamic parameters for last 24 hours:    Intake/Output from previous day: 03/02 0701 - 03/03 0700 In: 1621.8 [P.O.:1080; I.V.:541.8] Out: 7681 [Urine:1180; Chest Tube:610] Intake/Output this shift: No intake/output data recorded.  General appearance: alert, cooperative and no distress Neurologic: intact Heart: brady, regular Lungs: diminished breath sounds bibasilar Abdomen: normal findings: soft, non-tender  Lab Results: Recent Labs    04/04/17 0449 04/05/17 0431  WBC 9.8 6.4  HGB 7.6* 7.1*  HCT 22.2* 20.7*  PLT 99* 96*   BMET:  Recent Labs    04/04/17 0449 04/05/17 0431  NA 132* 131*  K 3.8 3.5  CL 99* 98*  CO2 25 27  GLUCOSE 77 99  BUN 21* 20  CREATININE 1.70* 1.63*  CALCIUM 7.5* 7.5*    PT/INR:  Recent Labs    04/02/17 1500  LABPROT 19.3*  INR 1.64   ABG    Component Value Date/Time   PHART 7.330 (L) 04/02/2017 2303   HCO3 25.8 04/02/2017 2303   TCO2 26 04/03/2017 1802   ACIDBASEDEF 3.0 (H) 04/02/2017 2202   O2SAT 97.0 04/02/2017 2303   CBG (last 3)  Recent Labs    04/04/17 1559 04/04/17 2147 04/05/17 0736  GLUCAP 176* 112* 87    Assessment/Plan: S/P Procedure(s) (LRB): MINIMALLY INVASIVE MITRAL VALVE REPAIR (MVR) with  (Right) MINIMALLY INVASIVE MAZE PROCEDURE (N/A) TRANSESOPHAGEAL ECHOCARDIOGRAM (TEE) (N/A) CLIPPING OF ATRIAL APPENDAGE (N/A) PATENT FORAMEN OVALE (PFO) CLOSURE (N/A) -  NEURO- some mild confusion yesterday afternoon. Better this AM. Low dose xanax PRN  CV- in junctional at 50, BP OK, off dopamine  Warfarin started INR 2.64  RESP- CXR shows some left lower lobe atelectasis + effusion  Flutter valve  RENAL- creatinine stable, supplement K  Continue diuresis  ENDO- CBG well controlled  Anemia secondary to ABL- Hct 20 this AM- will transfuse one unit  Continue cardiac rehab   LOS: 3 days    Melrose Nakayama 04/05/2017

## 2017-04-05 NOTE — Progress Notes (Signed)
Assumed care of patient from [facility]. Patient up in chair, did void in urinal. C/o shoulder /back pain medicated with tramadol. Blood finished at 1230 no s/s of reaction.

## 2017-04-05 NOTE — Progress Notes (Signed)
q ball discontinued. New dressing to chest tube sites and old qball site.

## 2017-04-05 NOTE — Progress Notes (Signed)
      [address]       [place] [address]             [phone]      Up in chair  BP (!) 110/98   Pulse (!) 53   Temp 98.2 F (36.8 C) (Oral)   Resp 20   Ht 5\' 5"  (1.651 m)   Wt 194 lb 0.1 oz (88 kg)   SpO2 99%   BMI 32.28 kg/m   Intake/Output Summary (Last 24 hours) at 04/05/2017 1630 Last data filed at 04/05/2017 1500 Gross per 24 hour  Intake 2050.81 ml  Output 1610 ml  Net 440.81 ml   Stable day no new issues  Remo Lipps C. Roxan Hockey, MD [facility] [phone]

## 2017-04-06 ENCOUNTER — Ambulatory Visit (HOSPITAL_COMMUNITY): Payer: Medicare Other

## 2017-04-06 ENCOUNTER — Encounter (HOSPITAL_COMMUNITY): Payer: Medicare Other

## 2017-04-06 ENCOUNTER — Other Ambulatory Visit (HOSPITAL_COMMUNITY): Payer: Medicare Other

## 2017-04-06 ENCOUNTER — Inpatient Hospital Stay (HOSPITAL_COMMUNITY): Payer: Medicare Other

## 2017-04-06 LAB — GLUCOSE, CAPILLARY
GLUCOSE-CAPILLARY: 99 mg/dL (ref 65–99)
GLUCOSE-CAPILLARY: 149 mg/dL — AB (ref 65–99)
GLUCOSE-CAPILLARY: 105 mg/dL — AB (ref 65–99)
Glucose-Capillary: 123 mg/dL — ABNORMAL HIGH (ref 65–99)

## 2017-04-06 LAB — BASIC METABOLIC PANEL
Anion gap: 7 (ref 5–15)
BUN: 20 mg/dL (ref 6–20)
CALCIUM: 7.9 mg/dL — AB (ref 8.9–10.3)
CO2: 26 mmol/L (ref 22–32)
CREATININE: 1.57 mg/dL — AB (ref 0.61–1.24)
Chloride: 100 mmol/L — ABNORMAL LOW (ref 101–111)
GFR, EST AFRICAN AMERICAN: 48 mL/min — AB (ref >60)
GFR, EST NON AFRICAN AMERICAN: 41 mL/min — AB (ref >60)
Glucose, Bld: 122 mg/dL — ABNORMAL HIGH (ref 65–99)
Potassium: 3.4 mmol/L — ABNORMAL LOW (ref 3.5–5.1)
SODIUM: 133 mmol/L — AB (ref 135–145)

## 2017-04-06 LAB — CBC
HCT: 24.1 % — ABNORMAL LOW (ref 39.0–52.0)
Hemoglobin: 8.3 g/dL — ABNORMAL LOW (ref 13.0–17.0)
MCH: 33.9 pg (ref 26.0–34.0)
MCHC: 34.4 g/dL (ref 30.0–36.0)
MCV: 98.4 fL (ref 78.0–100.0)
PLATELETS: 98 10*3/uL — AB (ref 150–400)
RBC: 2.45 MIL/uL — ABNORMAL LOW (ref 4.22–5.81)
RDW: 15.4 % (ref 11.5–15.5)
WBC: 5.6 10*3/uL (ref 4.0–10.5)

## 2017-04-06 LAB — PROTIME-INR
INR: 1.87
PROTHROMBIN TIME: 21.4 s — AB (ref 11.4–15.2)

## 2017-04-06 MED ORDER — ASPIRIN EC 81 MG PO TBEC
81.0000 mg | DELAYED_RELEASE_TABLET | Freq: Every day | ORAL | Status: DC
  Administered 2017-04-06 – 2017-04-09 (×4): 81 mg via ORAL
  Filled 2017-04-06 (×4): qty 1

## 2017-04-06 MED ORDER — POTASSIUM CHLORIDE CRYS ER 20 MEQ PO TBCR
20.0000 meq | EXTENDED_RELEASE_TABLET | Freq: Every day | ORAL | Status: DC
  Administered 2017-04-06 – 2017-04-07 (×2): 20 meq via ORAL
  Filled 2017-04-06 (×2): qty 1

## 2017-04-06 MED ORDER — ATORVASTATIN CALCIUM 80 MG PO TABS
80.0000 mg | ORAL_TABLET | Freq: Every day | ORAL | Status: DC
  Administered 2017-04-07 – 2017-04-08 (×2): 80 mg via ORAL
  Filled 2017-04-06 (×2): qty 1

## 2017-04-06 MED ORDER — IRBESARTAN 75 MG PO TABS
75.0000 mg | ORAL_TABLET | Freq: Every day | ORAL | Status: DC
  Administered 2017-04-06: 75 mg via ORAL
  Filled 2017-04-06 (×2): qty 1

## 2017-04-06 MED ORDER — ALPRAZOLAM 0.25 MG PO TABS
0.2500 mg | ORAL_TABLET | Freq: Every day | ORAL | Status: DC
  Administered 2017-04-06 – 2017-04-08 (×3): 0.25 mg via ORAL
  Filled 2017-04-06 (×3): qty 1

## 2017-04-06 MED ORDER — FUROSEMIDE 40 MG PO TABS
40.0000 mg | ORAL_TABLET | Freq: Every day | ORAL | Status: DC
  Administered 2017-04-07 – 2017-04-09 (×3): 40 mg via ORAL
  Filled 2017-04-06 (×3): qty 1

## 2017-04-06 MED ORDER — POTASSIUM CHLORIDE 10 MEQ/100ML IV SOLN
10.0000 meq | INTRAVENOUS | Status: AC
  Administered 2017-04-06 (×2): 10 meq via INTRAVENOUS
  Filled 2017-04-06 (×2): qty 100

## 2017-04-06 MED ORDER — MOVING RIGHT ALONG BOOK
Freq: Once | Status: AC
  Administered 2017-04-06: 10:00:00
  Filled 2017-04-06: qty 1

## 2017-04-06 MED ORDER — SERTRALINE HCL 50 MG PO TABS
50.0000 mg | ORAL_TABLET | Freq: Every day | ORAL | Status: DC
  Administered 2017-04-06 – 2017-04-08 (×3): 50 mg via ORAL
  Filled 2017-04-06 (×3): qty 1

## 2017-04-06 MED FILL — Potassium Chloride Inj 2 mEq/ML: INTRAVENOUS | Qty: 40 | Status: AC

## 2017-04-06 MED FILL — Heparin Sodium (Porcine) Inj 1000 Unit/ML: INTRAMUSCULAR | Qty: 30 | Status: AC

## 2017-04-06 MED FILL — Dexmedetomidine HCl in NaCl 0.9% IV Soln 400 MCG/100ML: INTRAVENOUS | Qty: 100 | Status: AC

## 2017-04-06 MED FILL — Magnesium Sulfate Inj 50%: INTRAMUSCULAR | Qty: 10 | Status: AC

## 2017-04-06 NOTE — Progress Notes (Signed)
Patient ID: [name] [name], male   DOB: August 24, 1940, 77 y.o.   MRN: [id number] TCTS Evening Rounds:  Hemodynamically stable. sats 100% Good urine output CT output 220 cc so far today.

## 2017-04-06 NOTE — Progress Notes (Signed)
Physical Therapy Treatment Patient Details Name: [name] [name] MRN: [id number] DOB: 1940-08-26 Today's Date: 04/06/2017    History of Present Illness Pt s/p minimally invasive MVR, MAZE procedure, clipping of atrial appendage, and PFO closure on 04/03/27. PMH - mitral regurgitation, afib, DM, HTN, TIA    PT Comments    Pt performed increased activity with no signs of symptoms of uncontrollable shaking as previously mentioned.  Pt fearful to mobilize but with encouragement and close chair follow he was agreeable to participate.  Pt presents with cognitive deficits and wife reports he is no longer able to manage his meds at home and she is now managing his meds after mutliple errors without assistance.  Informed nursing of BPs obtained during session.  Pt is not safe to return home and will benefit from skilled therapy to improve strength and function before returning home.  Pre tx 141/80 In standing 141/101 Posy tx 152/69    Follow Up Recommendations  CIR     Equipment Recommendations  Other (comment)(TBD)    Recommendations for Other Services       Precautions / Restrictions Precautions Precautions: Fall Precaution Comments: Pt's wife reports patient falling daily at home, bring chair follow for safety, history of tremoring that leads to falls.   Restrictions Weight Bearing Restrictions: No(minimally invasive no sternal precautions)    Mobility  Bed Mobility               General bed mobility comments: Pt up in chair  Transfers Overall transfer level: Needs assistance Equipment used: Rolling walker (2 wheeled) Transfers: Sit to/from Stand Sit to Stand: Min assist;+2 physical assistance;+2 safety/equipment         General transfer comment: Cues for hand placement to push from seated surface and to reach back for seated surface when returning to chair.  Pt attempting to pull into standing with RW.    Ambulation/Gait Ambulation/Gait assistance: Min  assist;+2 physical assistance;+2 safety/equipment(3rd person to follow closely with chair.  ) Ambulation Distance (Feet): 145 Feet(x2 trials) Assistive device: Rolling walker (2 wheeled) Gait Pattern/deviations: Step-through pattern;Decreased step length - right;Decreased step length - left;Shuffle;Trunk flexed Gait velocity: decr Gait velocity interpretation: Below normal speed for age/gender General Gait Details: Cues to step closer to RW and maintain close proximity during turns.  Cues for posture and forward gaze.  Pt required standing rest break mid trial.     Stairs            Wheelchair Mobility    Modified Rankin (Stroke Patients Only)       Balance                                            Cognition Arousal/Alertness: Awake/alert Behavior During Therapy: Anxious(Fearful of ambualtion due to uncontrollable shaking on previous session.  ) Overall Cognitive Status: Impaired/Different from baseline Area of Impairment: Memory;Orientation;Attention;Problem solving;Safety/judgement                 Orientation Level: Disoriented to;Time Current Attention Level: Sustained Memory: Decreased short-term memory;Decreased recall of precautions(Pt's wife reports he is not able to manage his own meds at home and has taken more and now she keeps them locked up.  )   Safety/Judgement: Decreased awareness of safety;Decreased awareness of deficits   Problem Solving: Requires verbal cues;Requires tactile cues;Slow processing General Comments: Pt reports the month is november and uses  humor to disguise his deficits.  Required increased time to perform simple math equation.        Exercises      General Comments        Pertinent Vitals/Pain Pain Assessment: Faces Faces Pain Scale: Hurts little more Pain Location: back pain Pain Descriptors / Indicators: Discomfort Pain Intervention(s): Monitored during session;Repositioned    Home Living                       Prior Function            PT Goals (current goals can now be found in the care plan section) Acute Rehab PT Goals Patient Stated Goal: Pt and wife want to figure out why he is having these episodes of shaking/tremors, this remains the focus and topic of conversation.   PT Goal Formulation: With patient/family Potential to Achieve Goals: Fair Progress towards PT goals: Progressing toward goals    Frequency    Min 3X/week      PT Plan Current plan remains appropriate    Co-evaluation PT/OT/SLP Co-Evaluation/Treatment: Yes Reason for Co-Treatment: Necessary to address cognition/behavior during functional activity;For patient/therapist safety;To address functional/ADL transfers(2nd set of skilled hands necessary due to episode last session of uncontrollable shaking and tremoring.  ) PT goals addressed during session: Mobility/safety with mobility OT goals addressed during session: ADL's and self-care      AM-PAC PT "6 Clicks" Daily Activity  Outcome Measure  Difficulty turning over in bed (including adjusting bedclothes, sheets and blankets)?: Unable Difficulty moving from lying on back to sitting on the side of the bed? : Unable Difficulty sitting down on and standing up from a chair with arms (e.g., wheelchair, bedside commode, etc,.)?: Unable Help needed moving to and from a bed to chair (including a wheelchair)?: A Little Help needed walking in hospital room?: A Little Help needed climbing 3-5 steps with a railing? : A Little 6 Click Score: 12    End of Session Equipment Utilized During Treatment: Gait belt Activity Tolerance: Patient tolerated treatment well;Patient limited by fatigue Patient left: in chair;with call bell/phone within reach;with chair alarm set;with family/visitor present Nurse Communication: Mobility status(informed nursing of BP and need for neuropsych consult.  ) PT Visit Diagnosis: Unsteadiness on feet (R26.81);Other  abnormalities of gait and mobility (R26.89);Repeated falls (R29.6);Difficulty in walking, not elsewhere classified (R26.2)     Time: 7793-9030 PT Time Calculation (min) (ACUTE ONLY): 35 min  Charges:  $Gait Training: 8-22 mins                    G Codes:       Governor Rooks, PTA pager [place] 04/06/2017, 3:12 PM

## 2017-04-06 NOTE — Progress Notes (Signed)
Occupational Therapy Evaluation Patient Details Name: [name] [name] MRN: [id number] DOB: Mar 28, 1940 Today's Date: 04/06/2017    History of Present Illness Pt s/p minimally invasive MVR, MAZE procedure, clipping of atrial appendage, and PFO closure on 04/03/27. PMH - mitral regurgitation, afib, DM, HTN, TIA   Clinical Impression   PTA, pt lived at home with wife, completed his own basic ADL tasks, ambulated using a [place] and had "daily falls due to shaking/trmor episodes". Pt states he managed his own medications, however wife states she does this for him as he has made mistakes with his medicatiions. Pt thinks it is currently November. +2 min A required for ambulation @ RW level and pt overall mod A for ADL tasks. Recommend CIR to maximize functional level of independence to safely DC home with wife. May benefit from neuropsych consult to further assess cognitive status as it appears pt has been having cognitive deficits PTA. Will follow acutely.     Follow Up Recommendations  CIR;Supervision/Assistance - 24 hour    Equipment Recommendations  3 in 1 bedside commode;Other (comment)(RW)    Recommendations for Other Services Rehab consult  Neuropsych consult     Precautions / Restrictions Precautions Precautions: Fall Precaution Comments: Pt's wife reports patient falling daily at home, bring chair follow for safety, history of tremoring that leads to falls.  Chest tube; pacing wires Restrictions Weight Bearing Restrictions: No(minimally invasive no sternal precautions)      Mobility Bed Mobility               General bed mobility comments: Pt up in chair  Transfers Overall transfer level: Needs assistance Equipment used: Rolling walker (2 wheeled) Transfers: Sit to/from Stand Sit to Stand: Min assist;+2 physical assistance;+2 safety/equipment         General transfer comment: Cues for hand placement to push from seated surface and to reach back for seated surface  when returning to chair.  Pt attempting to pull into standing with RW.      Balance Overall balance assessment: Needs assistance Sitting-balance support: Feet supported;Bilateral upper extremity supported Sitting balance-Leahy Scale: Good     Standing balance support: Bilateral upper extremity supported Standing balance-Leahy Scale: Poor Standing balance comment: dependent on exteranl support                           ADL either performed or assessed with clinical judgement   ADL Overall ADL's : Needs assistance/impaired     Grooming: Minimal assistance;Sitting   Upper Body Bathing: Minimal assistance;Sitting   Lower Body Bathing: Moderate assistance;Sit to/from stand   Upper Body Dressing : Minimal assistance;Sitting   Lower Body Dressing: Moderate assistance;Sit to/from stand   Toilet Transfer: Minimal assistance;BSC;+2 for safety/equipment   Toileting- Clothing Manipulation and Hygiene: Minimal assistance;Sit to/from stand       Functional mobility during ADLs: +2 for physical assistance;Minimal assistance;Cueing for safety;Rolling walker  Poor orientation of self in RW; unable to correct posture with max vc     Vision Baseline Vision/History: Wears glasses Additional Comments: Able to scan and read menu. No complaints of visual cahnges. Will further assess     Perception     Praxis      Pertinent Vitals/Pain Pain Assessment: Faces Faces Pain Scale: Hurts little more Pain Location: back pain Pain Descriptors / Indicators: Discomfort Pain Intervention(s): Limited activity within patient's tolerance     Hand Dominance Left   Extremity/Trunk Assessment Upper Extremity Assessment Upper Extremity Assessment:  Generalized weakness   Lower Extremity Assessment Lower Extremity Assessment: Defer to PT evaluation   Cervical / Trunk Assessment Cervical / Trunk Assessment: Kyphotic   Communication Communication Communication: No difficulties    Cognition Arousal/Alertness: Awake/alert Behavior During Therapy: Anxious(Fearful of ambualtion due to uncontrollable shaking on previous session.  ) Overall Cognitive Status: Impaired/Different from baseline Area of Impairment: Memory;Orientation;Attention;Problem solving;Safety/judgement                 Orientation Level: Disoriented to;Time (November) - reluctantly answered as "everyone asks me that" Current Attention Level: Sustained Memory: Decreased short-term memory;Decreased recall of precautions(Pt's wife reports he is not able to manage his own meds at home and has taken more and now she keeps them locked up.  )   Safety/Judgement: Decreased awareness of safety;Decreased awareness of deficits   Problem Solving: Requires verbal cues;Requires tactile cues;Slow processing General Comments: Pt reports the month is november and uses humor to disguise his deficits.  Required increased time to perform simple math equation.  Will further assess   General Comments   Required encouragement to participate    Exercises     Shoulder Instructions      Home Living Family/patient expects to be discharged to:: Private residence Living Arrangements: Spouse/significant other Available Help at Discharge: Family;Available PRN/intermittently(spouse works) Type of Home: House Home Access: Stairs to enter [employer] of Steps: 6 Entrance Stairs-Rails: Right Home Layout: Multi-level;Able to live on main level with bedroom/bathroom     Bathroom Shower/Tub: Tub/shower unit;Walk-in shower   Bathroom Toilet: Standard Bathroom Accessibility: No   Home Equipment: Cane - single point          Prior Functioning/Environment Level of Independence: Needs assistance  Gait / Transfers Assistance Needed: Amb with cane but with frequent falls due to shaking of legs/dizziness. Wife reports at least 1 fall per day. ADL's / Homemaking Assistance Needed: States he completed his own  bathing/dressing; wife assisted with medicaiton managment            OT Problem List: Decreased strength;Decreased range of motion;Decreased activity tolerance;Impaired balance (sitting and/or standing);Decreased safety awareness;Decreased knowledge of use of DME or AE;Cardiopulmonary status limiting activity;Decreased knowledge of precautions;Pain      OT Treatment/Interventions: Self-care/ADL training;Energy conservation;DME and/or AE instruction;Therapeutic activities;Cognitive remediation/compensation;Patient/family education;Balance training    OT Goals(Current goals can be found in the care plan section) Acute Rehab OT Goals Patient Stated Goal: Pt and wife want to figure out why he is having these episodes of shaking/tremors OT Goal Formulation: With patient/family Time For Goal Achievement: 04/20/17 Potential to Achieve Goals: Good  OT Frequency: Min 2X/week   Barriers to D/C:            Co-evaluation PT/OT/SLP Co-Evaluation/Treatment: Yes Reason for Co-Treatment: To address functional/ADL transfers;For patient/therapist safety PT goals addressed during session: Mobility/safety with mobility OT goals addressed during session: ADL's and self-care      AM-PAC PT "6 Clicks" Daily Activity     Outcome Measure Help from another person eating meals?: None Help from another person taking care of personal grooming?: A Little Help from another person toileting, which includes using toliet, bedpan, or urinal?: A Little Help from another person bathing (including washing, rinsing, drying)?: A Little Help from another person to put on and taking off regular upper body clothing?: A Little Help from another person to put on and taking off regular lower body clothing?: A Lot 6 Click Score: 18   End of Session Equipment Utilized During Treatment: Gait belt  Nurse Communication: Mobility status  Activity Tolerance: Patient tolerated treatment well Patient left: in chair;with call  bell/phone within reach;with chair alarm set;with family/visitor present  OT Visit Diagnosis: Unsteadiness on feet (R26.81);Other abnormalities of gait and mobility (R26.89);Muscle weakness (generalized) (M62.81);Repeated falls (R29.6);Other symptoms and signs involving cognitive function;Dizziness and giddiness (R42);Pain Pain - part of body: (back - chronic)                Time: 0301-3143 OT Time Calculation (min): 32 min Charges:  OT General Charges $OT Visit: 1 Visit OT Evaluation $OT Eval Moderate Complexity: 1 Mod G-Codes:     [place], OT/L  [phone] 04/06/2017  Eziah Negro,HILLARY 04/06/2017, 4:03 PM

## 2017-04-06 NOTE — Progress Notes (Signed)
Pt had couple runs of PVC's through out the night along with some pauses, his pacer was not capturing at times, turn pacer to DDD and increase rate to 60 and b/p have been within normal range. Pacer continues to work but still at times doesn't capture.     Pt is very impulsive and tries to get up without help, had to be redirected many times. Also inform pt to be aware of his chest tubes and tubing.   Pt is now up in chair.  Replacing K with 3 runs with potassium of 3.4 BM - 3/4

## 2017-04-06 NOTE — Progress Notes (Signed)
[address]       [place] [address]             [phone]        CARDIOTHORACIC SURGERY PROGRESS NOTE   R4 Days Post-Op Procedure(s) (LRB): MINIMALLY INVASIVE MITRAL VALVE REPAIR (MVR) with (Right) MINIMALLY INVASIVE MAZE PROCEDURE (N/A) TRANSESOPHAGEAL ECHOCARDIOGRAM (TEE) (N/A) CLIPPING OF ATRIAL APPENDAGE (N/A) PATENT FORAMEN OVALE (PFO) CLOSURE (N/A)  Subjective: Looks good and feels well.  Mild discomfort in back.  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  Lab Results:  CBC: Recent Labs    04/05/17 0431 04/05/17 1430 04/06/17 0427  WBC 6.4  --  5.6  HGB 7.1* 9.0* 8.3*  HCT 20.7* 25.9* 24.1*  PLT 96*  --  98*    BMET:  Recent Labs    04/05/17 0431 04/06/17 0427  NA 131* 133*  K 3.5 3.4*  CL 98* 100*  CO2 27 26  GLUCOSE 99 122*  BUN 20 20  CREATININE 1.63* 1.57*  CALCIUM 7.5* 7.9*     PT/INR:   Recent Labs    04/06/17 0427  LABPROT 21.4*  INR 1.87    CBG (last 3)  Recent Labs    04/05/17 1555 04/05/17 2110 04/06/17 0723  GLUCAP 162* 173* 99    ABG    Component Value Date/Time   PHART 7.330 (L) 04/02/2017 2303   PCO2ART 49.0 (H) 04/02/2017 2303   PO2ART 100.0 04/02/2017 2303   HCO3 25.8 04/02/2017 2303   TCO2 26 04/03/2017 1802   ACIDBASEDEF 3.0 (H) 04/02/2017 2202   O2SAT 97.0 04/02/2017 2303    CXR: PORTABLE CHEST 1 VIEW  COMPARISON:  04/05/2017 chest x-ray.  FINDINGS: Right-sided chest tubes remain in place. Two left central lines in place with tip in the region of the formation of the superior vena cava and left jugular vein/brachiocephalic vein expected junction. Appearance unchanged.  No pneumothorax identified.  Post mitral valve replacement. Heart size difficult to assess although appearing enlarged. Epicardial leads in place.  Calcified aorta.  Left base consolidation may represent pleural fluid with atelectasis. Infiltrate secondary less likely consideration.  Pulmonary vascular congestion with small amount of fluid in the minor fissure.  IMPRESSION: Persistent left base consolidation slightly improved suggestive of pleural fluid/atelectasis with infiltrate a less likely consideration.  Pulmonary vascular congestion with small amount of fluid minor fissure.  Post mitral valve replacement with cardiomegaly suspected.  Aortic Atherosclerosis (ICD10-I70.0).   Electronically Signed   By: Genia Del M.D.   On: 04/06/2017 07:10  Assessment/Plan: S/P Procedure(s) (LRB): MINIMALLY INVASIVE MITRAL VALVE REPAIR (MVR) with (Right) MINIMALLY INVASIVE MAZE PROCEDURE (N/A) TRANSESOPHAGEAL ECHOCARDIOGRAM (  TEE) (N/A) CLIPPING OF ATRIAL APPENDAGE (N/A) PATENT FORAMEN OVALE (PFO) CLOSURE (N/A)  Overall doing well POD4 Currently sinus w/ 2nd and 3rd degree AV block, BP stable Expected post op acute blood loss anemia, Hgb up 8.3 this morning Chronic diastolic CHF with expected post-op volume excess, weight down but still 2 lbs > preop Expected post op atelectasis, mild CKD stage III - creatinine stable, UOP adequate Type II diabetes mellitus, excellent  glycemic control 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

## 2017-04-07 LAB — GLUCOSE, CAPILLARY
GLUCOSE-CAPILLARY: 95 mg/dL (ref 65–99)
GLUCOSE-CAPILLARY: 143 mg/dL — AB (ref 65–99)
Glucose-Capillary: 113 mg/dL — ABNORMAL HIGH (ref 65–99)
Glucose-Capillary: 150 mg/dL — ABNORMAL HIGH (ref 65–99)

## 2017-04-07 LAB — BASIC METABOLIC PANEL
ANION GAP: 8 (ref 5–15)
BUN: 17 mg/dL (ref 6–20)
CO2: 27 mmol/L (ref 22–32)
Calcium: 8.2 mg/dL — ABNORMAL LOW (ref 8.9–10.3)
Chloride: 97 mmol/L — ABNORMAL LOW (ref 101–111)
Creatinine, Ser: 1.45 mg/dL — ABNORMAL HIGH (ref 0.61–1.24)
GFR calc Af Amer: 52 mL/min — ABNORMAL LOW (ref >60)
GFR, EST NON AFRICAN AMERICAN: 45 mL/min — AB (ref >60)
Glucose, Bld: 103 mg/dL — ABNORMAL HIGH (ref 65–99)
POTASSIUM: 3.7 mmol/L (ref 3.5–5.1)
SODIUM: 132 mmol/L — AB (ref 135–145)

## 2017-04-07 LAB — CBC
HCT: 28 % — ABNORMAL LOW (ref 39.0–52.0)
Hemoglobin: 9.6 g/dL — ABNORMAL LOW (ref 13.0–17.0)
MCH: 33.6 pg (ref 26.0–34.0)
MCHC: 34.3 g/dL (ref 30.0–36.0)
MCV: 97.9 fL (ref 78.0–100.0)
PLATELETS: 143 10*3/uL — AB (ref 150–400)
RBC: 2.86 MIL/uL — AB (ref 4.22–5.81)
RDW: 14.7 % (ref 11.5–15.5)
WBC: 6.3 10*3/uL (ref 4.0–10.5)

## 2017-04-07 LAB — PROTIME-INR
INR: 1.8
PROTHROMBIN TIME: 20.8 s — AB (ref 11.4–15.2)

## 2017-04-07 MED ORDER — FUROSEMIDE 10 MG/ML IJ SOLN
40.0000 mg | Freq: Once | INTRAMUSCULAR | Status: AC
  Administered 2017-04-07: 40 mg via INTRAVENOUS
  Filled 2017-04-07: qty 4

## 2017-04-07 MED ORDER — POTASSIUM CHLORIDE CRYS ER 20 MEQ PO TBCR
40.0000 meq | EXTENDED_RELEASE_TABLET | Freq: Once | ORAL | Status: AC
  Administered 2017-04-07: 40 meq via ORAL

## 2017-04-07 MED ORDER — IRBESARTAN 150 MG PO TABS
150.0000 mg | ORAL_TABLET | Freq: Every day | ORAL | Status: DC
  Administered 2017-04-08 – 2017-04-09 (×2): 150 mg via ORAL
  Filled 2017-04-07 (×4): qty 1

## 2017-04-07 MED FILL — Electrolyte-R (PH 7.4) Solution: INTRAVENOUS | Qty: 4000 | Status: AC

## 2017-04-07 MED FILL — Sodium Bicarbonate IV Soln 8.4%: INTRAVENOUS | Qty: 50 | Status: AC

## 2017-04-07 MED FILL — Lidocaine HCl IV Inj 20 MG/ML: INTRAVENOUS | Qty: 25 | Status: AC

## 2017-04-07 MED FILL — Sodium Chloride IV Soln 0.9%: INTRAVENOUS | Qty: 3000 | Status: AC

## 2017-04-07 MED FILL — Mannitol IV Soln 20%: INTRAVENOUS | Qty: 1000 | Status: AC

## 2017-04-07 MED FILL — Heparin Sodium (Porcine) Inj 1000 Unit/ML: INTRAMUSCULAR | Qty: 60 | Status: AC

## 2017-04-07 NOTE — Progress Notes (Addendum)
[address]       [name] [address]             [phone]      5 Days Post-Op Procedure(s) (LRB): MINIMALLY INVASIVE MITRAL VALVE REPAIR (MVR) with (Right) MINIMALLY INVASIVE MAZE PROCEDURE (N/A) TRANSESOPHAGEAL ECHOCARDIOGRAM (TEE) (N/A) CLIPPING OF ATRIAL APPENDAGE (N/A) PATENT FORAMEN OVALE (PFO) CLOSURE (N/A) Subjective: Feeling better each day + productive cough  Objective: Vital signs in last 24 hours: Temp:  [97.7 F (36.5 C)-99 F (37.2 C)] 97.7 F (36.5 C) (03/05 0400) Pulse Rate:  [30-160] 58 (03/05 0700) Cardiac Rhythm: Heart block (03/05 0400) Resp:  [14-26] 18 (03/05 0700) BP: (121-165)/(60-117) 152/80 (03/05 0700) SpO2:  [98 %-100 %] 100 % (03/05 0700) Weight:  [192 lb 14.4 oz (87.5 kg)] 192 lb 14.4 oz (87.5 kg) (03/05 0500)  Hemodynamic parameters for last 24 hours:    Intake/Output from previous day: 03/04 0701 - 03/05 0700 In: 710 [P.O.:660; IV Piggyback:50] Out: 2375 [Urine:2025; Chest Tube:350] Intake/Output this shift: No intake/output data recorded.    Physical Exam  Constitutional: He is oriented to person, place, and time.  NAD   Cardiovascular: Exam reveals no friction rub.  Irregular rate and rhythm   Pulmonary/Chest:  Coarse ronchi   Abdominal: Soft. Bowel sounds are normal. There is no tenderness.  Musculoskeletal:  Trace edema   Neurological: He is alert and oriented to person, place, and time.  Incis healing well    Lab Results: Recent Labs    04/06/17 0427 04/07/17 0232  WBC 5.6 6.3  HGB 8.3* 9.6*  HCT 24.1* 28.0*  PLT 98* 143*   BMET:  Recent Labs    04/06/17 0427 04/07/17 0232  NA 133* 132*  K 3.4* 3.7  CL 100* 97*  CO2 26 27  GLUCOSE 122* 103*  BUN 20 17  CREATININE 1.57* 1.45*  CALCIUM 7.9* 8.2*    PT/INR:  Recent Labs    04/07/17 0232  LABPROT 20.8*  INR 1.80   ABG    Component Value Date/Time   PHART 7.330 (L) 04/02/2017 2303   HCO3 25.8 04/02/2017 2303   TCO2 26 04/03/2017 1802   ACIDBASEDEF 3.0 (H) 04/02/2017 2202   O2SAT 97.0 04/02/2017 2303   CBG (last 3)  Recent Labs    04/06/17 1115 04/06/17 1607 04/06/17 2135  GLUCAP 149* 123* 105*    Meds Scheduled Meds: . acetaminophen  1,000 mg Oral Q6H  . ALPRAZolam  0.25 mg Oral QHS  . aspirin EC  81 mg Oral Daily  . atorvastatin  80 mg Oral q1800  . bisacodyl  10 mg Oral Daily   Or  . bisacodyl  10 mg Rectal Daily  . Chlorhexidine Gluconate Cloth  6 each Topical Daily  . docusate sodium  200 mg Oral Daily  . enoxaparin (LOVENOX) injection  40 mg Subcutaneous QHS  . furosemide  40 mg Oral Daily  . insulin aspart  0-15 Units Subcutaneous TID WC  . irbesartan  150 mg Oral Daily  . mouth rinse  15 mL Mouth Rinse BID  . pantoprazole  40 mg Oral Daily  . potassium chloride  20 mEq Oral Daily  . sertraline  50 mg Oral QHS  . sodium chloride flush  10-40 mL Intracatheter Q12H  . warfarin  2.5 mg Oral q1800  . Warfarin - Physician Dosing Inpatient   Does not apply q1800   Continuous Infusions: . lactated ringers 20 mL/hr at 04/05/17 2000  PRN Meds:.ALPRAZolam, metoprolol tartrate, morphine injection, ondansetron (ZOFRAN) IV, phenol, sodium chloride flush, traMADol  Xrays Dg Chest Port 1 View  Result Date: 04/06/2017 CLINICAL DATA:  77 year old male post mitral valve replacement. Subsequent encounter. EXAM: PORTABLE CHEST 1 VIEW COMPARISON:  04/05/2017 chest x-ray. FINDINGS: Right-sided chest tubes remain in place. Two left central lines in place with tip in the region of the formation of the superior vena cava and left jugular vein/brachiocephalic vein expected junction. Appearance unchanged. No pneumothorax identified. Post mitral valve replacement. Heart size difficult to assess although appearing enlarged. Epicardial leads in place. Calcified aorta. Left base consolidation may represent pleural fluid with atelectasis. Infiltrate secondary less likely consideration. Pulmonary vascular  congestion with small amount of fluid in the minor fissure. IMPRESSION: Persistent left base consolidation slightly improved suggestive of pleural fluid/atelectasis with infiltrate a less likely consideration. Pulmonary vascular congestion with small amount of fluid minor fissure. Post mitral valve replacement with cardiomegaly suspected. Aortic Atherosclerosis (ICD10-I70.0). Electronically Signed   By: Genia Del M.D.   On: 04/06/2017 07:10    Assessment/Plan: S/P Procedure(s) (LRB): MINIMALLY INVASIVE MITRAL VALVE REPAIR (MVR) with (Right) MINIMALLY INVASIVE MAZE PROCEDURE (N/A) TRANSESOPHAGEAL ECHOCARDIOGRAM (TEE) (N/A) CLIPPING OF ATRIAL APPENDAGE (N/A) PATENT FORAMEN OVALE (PFO) CLOSURE (N/A)  1 overall doing well 2 rhythm looks like afib with pvc's but hard to differentiate p waves rate is controlled- intolerant to amio in past (N/V), cont coumadin.  3 420 cc drainage from tube yesterday, 130 so far today- keep in place 4 H.H conts to improve 5 BUN/Creat improving- cont gentle diuresis, BP elevated- may need to increase ARB 6 BS control adeq for now, hold on metformin as we monitor renal fxn  LOS: 5 days    [name] 04/07/2017  I have seen and examined the patient and agree with the assessment and plan as outlined.  Awaiting bed on 4E for transfer.  Increase ARB.  Leave chest tubes 1 more day.  Mobilize.  Rexene Alberts, MD 04/07/2017 10:06 AM

## 2017-04-07 NOTE — Progress Notes (Signed)
Physical Therapy Treatment Patient Details Name: [name] MRN: [id number] DOB: 1940/06/16 Today's Date: 04/07/2017    History of Present Illness Pt s/p minimally invasive MVR, MAZE procedure, clipping of atrial appendage, and PFO closure on 04/03/27. PMH - mitral regurgitation, afib, DM, HTN, TIA    PT Comments    Pt performed increased gait and functional mobility during session.  He remains to rquire cues for safety and cognitive defictis continue to limit and impact function.  Pt continues to benefit from agreesive CIR therapies at this time to improve strength and function.  Pt did not use a RW at home and will need extensive neuro ed to improve balance.     Follow Up Recommendations  CIR     Equipment Recommendations  Other (comment)(TBD)    Recommendations for Other Services       Precautions / Restrictions Precautions Precautions: Fall Precaution Comments: Pt's wife reports patient falling daily at home, bring chair follow for safety, history of tremoring that leads to falls.   Restrictions Weight Bearing Restrictions: No    Mobility  Bed Mobility               General bed mobility comments: Pt up in chair  Transfers Overall transfer level: Needs assistance Equipment used: Rolling walker (2 wheeled) Transfers: Sit to/from Stand Sit to Stand: Min assist;+2 physical assistance;+2 safety/equipment         General transfer comment: Cues for hand placement to push from seated surface and to reach back for seated surface when returning to chair.  Pt attempting to pull into standing with RW.    Ambulation/Gait Ambulation/Gait assistance: Min assist;+2 safety/equipment Ambulation Distance (Feet): (perfomed in halls with standing rest breaksi due to fatigue.  137ft  x3 trials.) Assistive device: Rolling walker (2 wheeled) Gait Pattern/deviations: Step-through pattern;Decreased step length - right;Decreased step length - left;Shuffle;Trunk flexed    Gait velocity interpretation: at or above normal speed for age/gender General Gait Details: Cues to step closer to RW and maintain close proximity during turns.  Cues for posture and forward gaze.  Pt required standing rest break mid trial.     Stairs            Wheelchair Mobility    Modified Rankin (Stroke Patients Only)       Balance     Sitting balance-Leahy Scale: Good       Standing balance-Leahy Scale: Poor Standing balance comment: dependent on exteranl support                            Cognition Arousal/Alertness: Awake/alert Behavior During Therapy: Anxious Overall Cognitive Status: Impaired/Different from baseline Area of Impairment: Memory;Orientation;Attention;Problem solving;Safety/judgement                 Orientation Level: Disoriented to;Situation Current Attention Level: Sustained Memory: Decreased short-term memory;Decreased recall of precautions   Safety/Judgement: Decreased awareness of safety;Decreased awareness of deficits   Problem Solving: Requires verbal cues;Requires tactile cues;Slow processing General Comments: Pt reports he is [place] get to the resort.  Pt required frenquent educatrion that he is not going to a report.        Exercises      General Comments        Pertinent Vitals/Pain Pain Assessment: 0-10 Pain Score: 4  Pain Location: back pain Pain Descriptors / Indicators: Discomfort Pain Intervention(s): Monitored during session;Repositioned    Home Living  Prior Function            PT Goals (current goals can now be found in the care plan section) Acute Rehab PT Goals Patient Stated Goal: Pt and wife want to figure out why he is having these episodes of shaking/tremors Potential to Achieve Goals: Fair Progress towards PT goals: Progressing toward goals    Frequency    Min 3X/week      PT Plan Current plan remains appropriate    Co-evaluation               AM-PAC PT "6 Clicks" Daily Activity  Outcome Measure  Difficulty turning over in bed (including adjusting bedclothes, sheets and blankets)?: Unable Difficulty moving from lying on back to sitting on the side of the bed? : Unable Difficulty sitting down on and standing up from a chair with arms (e.g., wheelchair, bedside commode, etc,.)?: Unable Help needed moving to and from a bed to chair (including a wheelchair)?: A Little Help needed walking in hospital room?: A Little Help needed climbing 3-5 steps with a railing? : A Lot 6 Click Score: 11    End of Session Equipment Utilized During Treatment: Gait belt Activity Tolerance: Patient tolerated treatment well;Patient limited by fatigue Patient left: in chair;with call bell/phone within reach;with chair alarm set;with family/visitor present Nurse Communication: Mobility status(informed nursing of BP and need for neuro psych consult.  ) PT Visit Diagnosis: Unsteadiness on feet (R26.81);Other abnormalities of gait and mobility (R26.89);Repeated falls (R29.6);Difficulty in walking, not elsewhere classified (R26.2)     Time: 2951-8841 PT Time Calculation (min) (ACUTE ONLY): 18 min  Charges:  $Gait Training: 8-22 mins                    G Codes:       Governor Rooks, PTA pager [place] 04/07/2017, 1:45 PM
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
## 2017-04-07 NOTE — Discharge Instructions (Addendum)
Mitral Valve Replacement/Repair  Atrial Fibrillation Atrial fibrillation is a type of heartbeat that is irregular or fast (rapid). If you have this condition, your heart keeps quivering in a weird (chaotic) way. This condition can make it so your heart cannot pump blood normally. Having this condition gives a person more risk for stroke, heart failure, and other heart problems. There are different types of atrial fibrillation. Talk with your doctor to learn about the type that you have. Follow these instructions at home:  Take over-the-counter and prescription medicines only as told by your doctor.  If your doctor prescribed a blood-thinning medicine, take it exactly as told. Taking too much of it can cause bleeding. If you do not take enough of it, you will not have the protection that you need against stroke and other problems.  Do not use any tobacco products. These include cigarettes, chewing tobacco, and e-cigarettes. If you need help quitting, ask your doctor.  If you have apnea (obstructive sleep apnea), manage it as told by your doctor.  Do not drink alcohol.  Do not drink beverages that have caffeine. These include coffee, soda, and tea.  Maintain a healthy weight. Do not use diet pills unless your doctor says they are safe for you. Diet pills may make heart problems worse.  Follow diet instructions as told by your doctor.  Exercise regularly as told by your doctor.  Keep all follow-up visits as told by your doctor. This is important. Contact a doctor if:  You notice a change in the speed, rhythm, or strength of your heartbeat.  You are taking a blood-thinning medicine and you notice more bruising.  You get tired more easily when you move or exercise. Get help right away if:  You have pain in your chest or your belly (abdomen).  You have sweating or weakness.  You feel sick to your stomach (nauseous).  You notice blood in your throw up (vomit), poop (stool), or pee  (urine).  You are short of breath.  You suddenly have swollen feet and ankles.  You feel dizzy.  Your suddenly get weak or numb in your face, arms, or legs, especially if it happens on one side of your body.  You have trouble talking, trouble understanding, or both.  Your face or your eyelid droops on one side. These symptoms may be an emergency. Do not wait to see if the symptoms will go away. Get medical help right away. Call your local emergency services (911 in the [place] Do not drive yourself to the hospital. This information is not intended to replace advice given to you by your health care provider. Make sure you discuss any questions you have with your health care provider. Document Released: 10/30/2007 Document Revised: 06/28/2015 Document Reviewed: 05/17/2014 Elsevier Interactive Patient Education  2018 [place] After This sheet gives you information about how to care for yourself after your procedure. Your health care provider may also give you more specific instructions. If you have problems or questions, contact your health care provider. What can I expect after the procedure? After the procedure, it is common to have:  Pain at the incision area that may last for several weeks.  Follow these instructions at home: Incision care  Follow instructions from your health care provider about how to take care of your incision. Make sure you: ? Wash your hands with soap and water before you change your bandage (dressing). If soap and water are not available, use hand sanitizer. ?  Change your dressing as told by your health care provider. ? Leave stitches (sutures), skin glue, or adhesive strips in place. These skin closures may need to stay in place for 2 weeks or longer. If adhesive strip edges start to loosen and curl up, you may trim the loose edges. Do not remove adhesive strips completely unless your health care provider tells you to do that.  Check your incision  area every day for signs of infection. Check for: ? More redness, swelling, or pain. ? More fluid or blood. ? Warmth. ? Pus or a bad smell.  Do not apply powder or lotion to the area. Driving  Do not drive until your health care provider approves.  Do not drive or use heavy machinery while taking prescription pain medicines. Bathing  Do not take baths, swim, or use a hot tub for 2-4 weeks after surgery, or until your health care provider approves. Ask your health care provider if you may take showers.  To wash the incision site, gently wash with soap and water and pat the area dry with a clean towel. Do not rub the incision area. That may cause bleeding. Activity  Rest as told by your health care provider. Ask your health care provider when you can resume normal activities, including sexual activity.  Avoid the following activities for 6-8 weeks, or as long as directed: ? Lifting anything that is heavier than 10 lb (4.5 kg), or the limit that your health care provider tells you. ? Pushing or pulling things with your arms.  Avoid climbing stairs and using the handrail to pull yourself up for the first 2-3 weeks after surgery.  Avoid airplane travel for 4-6 weeks, or as long as directed.  Avoid sitting for long periods of time and crossing your legs. Get up and move around at least once every 1-2 hours.  If you are taking blood thinners (anticoagulants), avoid activities that have a high risk of injury. Ask your health care provider what activities are safe for you. Lifestyle  Limit alcohol intake to no more than 1 drink a day for nonpregnant women and 2 drinks a day for men. One drink equals 12 oz of beer, 5 oz of wine, or 1 oz of hard liquor.  Do not use any products that contain nicotine or tobacco, such as cigarettes and e-cigarettes. If you need help quitting, ask your health care provider. General instructions  Take your temperature every day and weigh yourself every  morning for the first 7 days after surgery. Write your temperatures and weight down and take this record with you to any follow-up visits.  Take over-the-counter and prescription medicines only as told by your health care provider.  To prevent or treat constipation while you are taking prescription pain medicine, your health care provider may recommend that you: ? Drink enough fluid to keep your urine clear or pale yellow. ? Take over-the-counter or prescription medicines. ? Eat foods that are high in fiber, such as fresh fruits and vegetables, whole grains, and beans. ? Limit foods that are high in fat and processed sugars, such as fried and sweet foods.  Follow instructions from your health care provider about eating or drinking restrictions.  Wear compression stockings for at least 2 weeks, or as long as told by your health care provider. These stockings help to prevent blood clots and reduce swelling in your legs. If your ankles are swollen after 2 weeks, continue to wear the stockings.  Keep all follow-up  visits as told by your health care provider. This is important. Contact a health care provider if:  You develop a skin rash.  Your weight is increasing each day over 2-3 days.  You gain 2 lb (1 kg) or more in a single day.  You have a fever. Get help right away if:  You develop chest pain that feels different from the pain caused by your incision.  You develop shortness of breath or difficulty breathing.  You have more redness, swelling, or pain around your incision.  You have more fluid or blood coming from your incision.  Your incision feels warm to the touch.  You have pus or a bad smell coming from your incision.  You feel light-headed. This information is not intended to replace advice given to you by your health care provider. Make sure you discuss any questions you have with your health care provider. Document Released: 08/09/2004 Document Revised: 11/02/2015  Document Reviewed: 11/02/2015 Elsevier Interactive Patient Education  2018 [place] on my medicine - Coumadin   (Warfarin)  This medication education was reviewed with me or my healthcare representative as part of my discharge preparation.  The pharmacist that spoke with me during my hospital stay was:  Betsey Holiday, [facility]  Why was Coumadin prescribed for you? Coumadin was prescribed for you because you have a blood clot or a medical condition that can cause an increased risk of forming blood clots. Blood clots can cause serious health problems by blocking the flow of blood to the heart, lung, or brain. Coumadin can prevent harmful blood clots from forming. As a reminder your indication for Coumadin is:   Stroke Prevention Because Of Atrial Fibrillation  What test will check on my response to Coumadin? While on Coumadin (warfarin) you will need to have an INR test regularly to ensure that your dose is keeping you in the desired range. The INR (international normalized ratio) number is calculated from the result of the laboratory test called prothrombin time (PT).  If an INR APPOINTMENT HAS NOT ALREADY BEEN MADE FOR YOU please schedule an appointment to have this lab work done by your health care provider within 7 days. Your INR goal is usually a number between:  2 to 3 or your provider may give you a more narrow range like 2-2.5.  Ask your health care provider during an office visit what your goal INR is.  What  do you need to  know  About  COUMADIN? Take Coumadin (warfarin) exactly as prescribed by your healthcare provider about the same time each day.  DO NOT stop taking without talking to the doctor who prescribed the medication.  Stopping without other blood clot prevention medication to take the place of Coumadin may increase your risk of developing a new clot or stroke.  Get refills before you run out.  What do you do if you miss a dose? If you miss a dose, take it as  soon as you remember on the same day then continue your regularly scheduled regimen the next day.  Do not take two doses of Coumadin at the same time.  Important Safety Information A possible side effect of Coumadin (Warfarin) is an increased risk of bleeding. You should call your healthcare provider right away if you experience any of the following: ? Bleeding from an injury or your nose that does not stop. ? Unusual colored urine (red or dark brown) or unusual colored stools (red or black). ? Unusual bruising  for unknown reasons. ? A serious fall or if you hit your head (even if there is no bleeding).  Some foods or medicines interact with Coumadin (warfarin) and might alter your response to warfarin. To help avoid this: ? Eat a balanced diet, maintaining a consistent amount of Vitamin K. ? Notify your provider about major diet changes you plan to make. ? Avoid alcohol or limit your intake to 1 drink for women and 2 drinks for men per day. (1 drink is 5 oz. wine, 12 oz. beer, or 1.5 oz. liquor.)  Make sure that ANY health care provider who prescribes medication for you knows that you are taking Coumadin (warfarin).  Also make sure the healthcare provider who is monitoring your Coumadin knows when you have started a new medication including herbals and non-prescription products.  Coumadin (Warfarin)  Major Drug Interactions  Increased Warfarin Effect Decreased Warfarin Effect  Alcohol (large quantities) Antibiotics (esp. Septra/Bactrim, Flagyl, Cipro) Amiodarone (Cordarone) Aspirin (ASA) Cimetidine (Tagamet) Megestrol (Megace) NSAIDs (ibuprofen, naproxen, etc.) Piroxicam (Feldene) Propafenone (Rythmol SR) Propranolol (Inderal) Isoniazid (INH) Posaconazole (Noxafil) Barbiturates (Phenobarbital) Carbamazepine (Tegretol) Chlordiazepoxide (Librium) Cholestyramine (Questran) Griseofulvin Oral Contraceptives Rifampin Sucralfate (Carafate) Vitamin K   Coumadin (Warfarin) Major  Herbal Interactions  Increased Warfarin Effect Decreased Warfarin Effect  Garlic Ginseng Ginkgo biloba Coenzyme Q10 Green tea [place] wort    Coumadin (Warfarin) FOOD Interactions  Eat a consistent number of servings per week of foods HIGH in Vitamin K (1 serving =  cup)  Collards (cooked, or boiled & drained) Kale (cooked, or boiled & drained) Mustard greens (cooked, or boiled & drained) Parsley *serving size only =  cup Spinach (cooked, or boiled & drained) Swiss chard (cooked, or boiled & drained) Turnip greens (cooked, or boiled & drained)  Eat a consistent number of servings per week of foods MEDIUM-HIGH in Vitamin K (1 serving = 1 cup)  Asparagus (cooked, or boiled & drained) Broccoli (cooked, boiled & drained, or raw & chopped) Brussel sprouts (cooked, or boiled & drained) *serving size only =  cup Lettuce, raw (green leaf, endive, romaine) Spinach, raw Turnip greens, raw & chopped   These websites have more information on Coumadin (warfarin):  FailFactory.se; VeganReport.com.au;

## 2017-04-08 LAB — BASIC METABOLIC PANEL
ANION GAP: 10 (ref 5–15)
BUN: 14 mg/dL (ref 6–20)
CHLORIDE: 96 mmol/L — AB (ref 101–111)
CO2: 29 mmol/L (ref 22–32)
CREATININE: 1.34 mg/dL — AB (ref 0.61–1.24)
Calcium: 8.4 mg/dL — ABNORMAL LOW (ref 8.9–10.3)
GFR calc non Af Amer: 50 mL/min — ABNORMAL LOW (ref >60)
GFR, EST AFRICAN AMERICAN: 58 mL/min — AB (ref >60)
Glucose, Bld: 122 mg/dL — ABNORMAL HIGH (ref 65–99)
POTASSIUM: 3.2 mmol/L — AB (ref 3.5–5.1)
SODIUM: 135 mmol/L (ref 135–145)

## 2017-04-08 LAB — GLUCOSE, CAPILLARY
GLUCOSE-CAPILLARY: 110 mg/dL — AB (ref 65–99)
GLUCOSE-CAPILLARY: 112 mg/dL — AB (ref 65–99)
GLUCOSE-CAPILLARY: 120 mg/dL — AB (ref 65–99)

## 2017-04-08 LAB — PROTIME-INR
INR: 1.79
Prothrombin Time: 20.7 seconds — ABNORMAL HIGH (ref 11.4–15.2)

## 2017-04-08 MED ORDER — METFORMIN HCL 500 MG PO TABS
500.0000 mg | ORAL_TABLET | Freq: Two times a day (BID) | ORAL | Status: DC
  Administered 2017-04-08 – 2017-04-09 (×3): 500 mg via ORAL
  Filled 2017-04-08 (×3): qty 1

## 2017-04-08 MED ORDER — POTASSIUM CHLORIDE CRYS ER 20 MEQ PO TBCR
40.0000 meq | EXTENDED_RELEASE_TABLET | Freq: Two times a day (BID) | ORAL | Status: AC
  Administered 2017-04-08 (×2): 40 meq via ORAL
  Filled 2017-04-08 (×2): qty 2

## 2017-04-08 NOTE — Progress Notes (Signed)
CARDIAC REHAB PHASE I   PRE:  Rate/Rhythm: 79 AFIB  BP:  Supine: 141/87    SaO2: 97% RA  MODE:  Ambulation: 390 ft   POST:  Rate/Rhythm: 97 A FIb  BP:  Sitting: 147/79     SaO2: 98% RA  1018-1104 Pt ambulated 380 ft with two assist. Pt walked with RW and staff followed with Rollator. Verbal cues needed for RW use.  Pt sat once for fatigue.  Pt assisted to [facility] after walk and then to bed.   Noel Christmas, RN 04/08/2017 11:00 AM

## 2017-04-08 NOTE — Progress Notes (Addendum)
[address]       [place] [address]             [phone]      6 Days Post-Op Procedure(s) (LRB): MINIMALLY INVASIVE MITRAL VALVE REPAIR (MVR) with (Right) MINIMALLY INVASIVE MAZE PROCEDURE (N/A) TRANSESOPHAGEAL ECHOCARDIOGRAM (TEE) (N/A) CLIPPING OF ATRIAL APPENDAGE (N/A) PATENT FORAMEN OVALE (PFO) CLOSURE (N/A) Subjective: Feels better, cough improved with less sputum. Ambulation improving  Objective: Vital signs in last 24 hours: Temp:  [98.3 F (36.8 C)-98.9 F (37.2 C)] 98.9 F (37.2 C) (03/06 0343) Pulse Rate:  [27-86] 82 (03/06 0343) Cardiac Rhythm: Atrial fibrillation (03/06 0343) Resp:  [16-35] 21 (03/06 0343) BP: (126-170)/(58-104) 138/62 (03/06 0343) SpO2:  [95 %-100 %] 95 % (03/06 0343) Weight:  [184 lb 1.4 oz (83.5 kg)] 184 lb 1.4 oz (83.5 kg) (03/06 0343)  Hemodynamic parameters for last 24 hours:    Intake/Output from previous day: 03/05 0701 - 03/06 0700 In: 600 [P.O.:600] Out: 1106 [Urine:925; Stool:1; Chest Tube:180] Intake/Output this shift: No intake/output data recorded.  General appearance: alert, cooperative and no distress Heart: irregularly irregular rhythm Lungs: some upper airway ronchi, improves with cough Abdomen: benign Extremities: + BLE edema Wound: incis healing well   Lab Results: Recent Labs    04/06/17 0427 04/07/17 0232  WBC 5.6 6.3  HGB 8.3* 9.6*  HCT 24.1* 28.0*  PLT 98* 143*   BMET:  Recent Labs    04/07/17 0232 04/08/17 0237  NA 132* 135  K 3.7 3.2*  CL 97* 96*  CO2 27 29  GLUCOSE 103* 122*  BUN 17 14  CREATININE 1.45* 1.34*  CALCIUM 8.2* 8.4*    PT/INR:  Recent Labs    04/08/17 0237  LABPROT 20.7*  INR 1.79   ABG    Component Value Date/Time   PHART 7.330 (L) 04/02/2017 2303   HCO3 25.8 04/02/2017 2303   TCO2 26 04/03/2017 1802   ACIDBASEDEF 3.0 (H) 04/02/2017 2202   O2SAT 97.0 04/02/2017 2303   CBG (last 3)  Recent Labs    04/07/17 1606 04/07/17 2015  04/08/17 0616  GLUCAP 150* 143* 110*    Meds Scheduled Meds: . ALPRAZolam  0.25 mg Oral QHS  . aspirin EC  81 mg Oral Daily  . atorvastatin  80 mg Oral q1800  . bisacodyl  10 mg Oral Daily   Or  . bisacodyl  10 mg Rectal Daily  . Chlorhexidine Gluconate Cloth  6 each Topical Daily  . docusate sodium  200 mg Oral Daily  . enoxaparin (LOVENOX) injection  40 mg Subcutaneous QHS  . furosemide  40 mg Oral Daily  . insulin aspart  0-15 Units Subcutaneous TID WC  . irbesartan  150 mg Oral Daily  . mouth rinse  15 mL Mouth Rinse BID  . pantoprazole  40 mg Oral Daily  . potassium chloride  20 mEq Oral Daily  . sertraline  50 mg Oral QHS  . warfarin  2.5 mg Oral q1800  . Warfarin - Physician Dosing Inpatient   Does not apply q1800   Continuous Infusions: . lactated ringers 20 mL/hr at 04/05/17 2000   PRN Meds:.ALPRAZolam, metoprolol tartrate, morphine injection, ondansetron (ZOFRAN) IV, phenol, traMADol  Xrays No results found.  Assessment/Plan: S/P Procedure(s) (LRB): MINIMALLY INVASIVE MITRAL VALVE REPAIR (MVR) with (Right) MINIMALLY INVASIVE MAZE PROCEDURE (N/A) TRANSESOPHAGEAL ECHOCARDIOGRAM (TEE) (N/A) CLIPPING OF ATRIAL APPENDAGE (N/A) PATENT FORAMEN OVALE (PFO) CLOSURE (N/A)   1 doing well overall  2 drainage decreased- will d/c tubes today 3 atrial fib , episode of vent trigeminy- replace K+ for 3.2. INR 1.79 will increase coumadin dose a little 4 creat conts to improve- cont diuretic for volume overload 5 sugars fairly well controlled, will restart metformin 6 BP improving control with ARB increase yesterday, follow 7 cont pulm toilet/cardiac rehab  LOS: 6 days    [name] [name] 04/08/2017  I have seen and examined the patient and agree with the assessment and plan as outlined.  Rexene Alberts, MD 04/08/2017 8:40 AM

## 2017-04-08 NOTE — Progress Notes (Signed)
Occupational Therapy Treatment Patient Details Name: [name] [name] MRN: [id number] DOB: Apr 08, 1940 Today's Date: 04/08/2017    History of present illness Pt s/p minimally invasive MVR, MAZE procedure, clipping of atrial appendage, and PFO closure on 04/03/27. PMH - mitral regurgitation, afib, DM, HTN, TIA   OT comments  Pt progressing towards OT goals this session. In room mobility with RW and education on sternal precautions for transfers. Pt remains adament about not wanting CIR - he was potentially in neuro outpatient PT and would like to return there..I explained that he needs more therapy before he is only going once a week (which he agreed with) and then immediately says he does not want to be at the hospital anymore. Recommendation changed to SNF to respect his wishes of being closer to home so his wife can go to work. OT will continue to follow in the acute setting and he will require skilled OT post-acute for cognitive, balance, and to maxmize safety and independence in ADL and functional transfers.    Follow Up Recommendations  SNF;Supervision/Assistance - 24 hour    Equipment Recommendations  3 in 1 bedside commode;Other (comment)(Rollator)    Recommendations for Other Services      Precautions / Restrictions Precautions Precautions: Fall Precaution Comments: Pt's wife reports patient falling daily at home, bring chair follow for safety, history of tremoring that leads to falls.   Restrictions Weight Bearing Restrictions: Yes Other Position/Activity Restrictions: Sternal       Mobility Bed Mobility               General bed mobility comments: Pt OOB in recliner when OT entered the room  Transfers Overall transfer level: Needs assistance Equipment used: Rolling walker (2 wheeled)(rollator) Transfers: Sit to/from Stand Sit to Stand: Min assist;+2 safety/equipment         General transfer comment: cues for sternal precaution hand placement, Pt required  rocking for momentum    Balance Overall balance assessment: Needs assistance Sitting-balance support: Feet supported;Bilateral upper extremity supported Sitting balance-Leahy Scale: Good Sitting balance - Comments: If not having shaking/tremors pt doesn't require assist for static sitting. If having shaking/tremors pt requires min guard for safety.   Standing balance support: Bilateral upper extremity supported Standing balance-Leahy Scale: Poor Standing balance comment: dependent on external support                           ADL either performed or assessed with clinical judgement   ADL Overall ADL's : Needs assistance/impaired     Grooming: Wash/dry hands;Wash/dry face;Oral care;Sitting;Minimal assistance;Cueing for sequencing Grooming Details (indicate cue type and reason): in recliner                 Toilet Transfer: Minimal assistance;BSC;+2 for safety/equipment   Toileting- Clothing Manipulation and Hygiene: Moderate assistance Toileting - Clothing Manipulation Details (indicate cue type and reason): min A for balance     Functional mobility during ADLs: +2 for physical assistance;Minimal assistance;Cueing for safety;Rolling walker       Vision   Vision Assessment?: No apparent visual deficits   Perception     Praxis      Cognition Arousal/Alertness: Awake/alert Behavior During Therapy: Anxious Overall Cognitive Status: Impaired/Different from baseline Area of Impairment: Attention;Memory;Safety/judgement;Awareness;Problem solving                   Current Attention Level: Sustained Memory: Decreased short-term memory;Decreased recall of precautions   Safety/Judgement: Decreased awareness of safety;Decreased  awareness of deficits   Problem Solving: Requires verbal cues;Requires tactile cues;Slow processing General Comments: Pt unable to logically reason the idea of rehab at this point        Exercises     Shoulder Instructions        General Comments Wife present throughout session    Pertinent Vitals/ Pain       Pain Assessment: No/denies pain Pain Intervention(s): Monitored during session;Repositioned  Home Living                                          Prior Functioning/Environment              Frequency  Min 2X/week        Progress Toward Goals  OT Goals(current goals can now be found in the care plan section)  Progress towards OT goals: Progressing toward goals  Acute Rehab OT Goals Patient Stated Goal: Pt and wife want to figure out why he is having these episodes of shaking/tremors OT Goal Formulation: With patient/family Time For Goal Achievement: 04/20/17 Potential to Achieve Goals: Good  Plan Discharge plan needs to be updated;Frequency needs to be updated    Co-evaluation                 AM-PAC PT "6 Clicks" Daily Activity     Outcome Measure   Help from another person eating meals?: None Help from another person taking care of personal grooming?: A Little Help from another person toileting, which includes using toliet, bedpan, or urinal?: A Little Help from another person bathing (including washing, rinsing, drying)?: A Little Help from another person to put on and taking off regular upper body clothing?: A Little Help from another person to put on and taking off regular lower body clothing?: A Lot 6 Click Score: 18    End of Session Equipment Utilized During Treatment: Gait belt;Rolling walker  OT Visit Diagnosis: Unsteadiness on feet (R26.81);Other abnormalities of gait and mobility (R26.89);Muscle weakness (generalized) (M62.81);Repeated falls (R29.6);Other symptoms and signs involving cognitive function;Dizziness and giddiness (R42)   Activity Tolerance Patient tolerated treatment well   Patient Left in chair;with call bell/phone within reach;with chair alarm set;with family/visitor present   Nurse Communication Mobility status         Time: 4888-9169 OT Time Calculation (min): 17 min  Charges: OT General Charges $OT Visit: 1 Visit OT Treatments $Self Care/Home Management : 8-22 mins Hulda Humphrey OTR/L [place] 04/08/2017, 5:43 PM

## 2017-04-09 ENCOUNTER — Inpatient Hospital Stay (HOSPITAL_COMMUNITY): Payer: Medicare Other

## 2017-04-09 LAB — BASIC METABOLIC PANEL
ANION GAP: 6 (ref 5–15)
BUN: 13 mg/dL (ref 6–20)
CO2: 30 mmol/L (ref 22–32)
Calcium: 8 mg/dL — ABNORMAL LOW (ref 8.9–10.3)
Chloride: 98 mmol/L — ABNORMAL LOW (ref 101–111)
Creatinine, Ser: 1.41 mg/dL — ABNORMAL HIGH (ref 0.61–1.24)
GFR calc Af Amer: 54 mL/min — ABNORMAL LOW (ref >60)
GFR, EST NON AFRICAN AMERICAN: 47 mL/min — AB (ref >60)
Glucose, Bld: 103 mg/dL — ABNORMAL HIGH (ref 65–99)
POTASSIUM: 4.1 mmol/L (ref 3.5–5.1)
Sodium: 134 mmol/L — ABNORMAL LOW (ref 135–145)

## 2017-04-09 LAB — TYPE AND SCREEN
ABO/RH(D): B POS
Antibody Screen: NEGATIVE
UNIT DIVISION: 0
Unit division: 0

## 2017-04-09 LAB — GLUCOSE, CAPILLARY
Glucose-Capillary: 111 mg/dL — ABNORMAL HIGH (ref 65–99)
Glucose-Capillary: 122 mg/dL — ABNORMAL HIGH (ref 65–99)

## 2017-04-09 LAB — BPAM RBC
BLOOD PRODUCT EXPIRATION DATE: 201903182359
Blood Product Expiration Date: 201903292359
ISSUE DATE / TIME: 201902281119
ISSUE DATE / TIME: 201903030924
UNIT TYPE AND RH: 7300
UNIT TYPE AND RH: 7300

## 2017-04-09 LAB — PROTIME-INR
INR: 2.01
Prothrombin Time: 22.6 seconds — ABNORMAL HIGH (ref 11.4–15.2)

## 2017-04-09 MED ORDER — POTASSIUM CHLORIDE ER 20 MEQ PO TBCR: 20.0000 meq | EXTENDED_RELEASE_TABLET | Freq: Every day | ORAL | 1 refills | Status: DC

## 2017-04-09 MED ORDER — ASPIRIN 81 MG PO TBEC: 81.0000 mg | DELAYED_RELEASE_TABLET | Freq: Every day | ORAL | Status: DC

## 2017-04-09 MED ORDER — IRBESARTAN 150 MG PO TABS: 150.0000 mg | ORAL_TABLET | Freq: Every day | ORAL | 1 refills | Status: DC

## 2017-04-09 MED ORDER — TRAMADOL HCL 50 MG PO TABS: 50.0000 mg | ORAL_TABLET | Freq: Four times a day (QID) | ORAL | 0 refills | Status: DC | PRN

## 2017-04-09 MED ORDER — FUROSEMIDE 40 MG PO TABS: 20.0000 mg | ORAL_TABLET | ORAL | 2 refills | Status: DC

## 2017-04-09 MED ORDER — WARFARIN SODIUM 2.5 MG PO TABS: 2.5000 mg | ORAL_TABLET | Freq: Every day | ORAL | 1 refills | Status: DC

## 2017-04-09 NOTE — Progress Notes (Signed)
Physical Therapy Treatment Patient Details Name: [name] [name] MRN: [id number] DOB: [birth year] Today's Date: 04/09/2017    History of Present Illness Pt s/p minimally invasive MVR, MAZE procedure, clipping of atrial appendage, and PFO closure on 04/03/27. PMH - mitral regurgitation, afib, DM, HTN, TIA    PT Comments    Pt performed increased activity including stair training.  Pt returning home with support from his wife.  He continues to make progress but would still continue to benefit from rehab in a post acute setting.  Pt adamant to return home and all questions regarding mobility answered during this session.     Follow Up Recommendations  CIR(Pt refusing rehab and returning home despite recs.  )     Equipment Recommendations  Other (comment)(TBD)    Recommendations for Other Services       Precautions / Restrictions Precautions Precautions: Fall Precaution Comments: Pt's wife reports patient falling daily at home, bring chair follow for safety, history of tremoring that leads to falls.   Restrictions Weight Bearing Restrictions: No(Pt does not have sternal precautions valve replacement was minmally invasive.  )    Mobility  Bed Mobility Overal bed mobility: Needs Assistance Bed Mobility: Supine to Sit     Supine to sit: Supervision     General bed mobility comments: Cues for hand placement, patient wanted therapist to assist but educated to attempt to perform as he is choosing to return home. + use of rails.    Transfers Overall transfer level: Needs assistance Equipment used: Rolling walker (2 wheeled) Transfers: Sit to/from Stand Sit to Stand: Min guard         General transfer comment: Cues for hand placement to push from seated surface.  cues for safety.    Ambulation/Gait Ambulation/Gait assistance: Min guard Ambulation Distance (Feet): 200 Feet Assistive device: Rolling walker (2 wheeled) Gait Pattern/deviations: Step-through  pattern;Decreased step length - right;Decreased step length - left;Shuffle;Trunk flexed Gait velocity: decr   General Gait Details: Cues to step closer to RW and maintain close proximity during turns.  Cues for posture and forward gaze.  Pt required standing rest break mid trial.     Stairs Stairs: Yes   Stair Management: One rail Left Number of Stairs: 5 General stair comments: Cues for sequencing and safety patient with increased WOB during stair negotiation.    Wheelchair Mobility    Modified Rankin (Stroke Patients Only)       Balance Overall balance assessment: Needs assistance   Sitting balance-Leahy Scale: Good       Standing balance-Leahy Scale: Poor Standing balance comment: Pt required RW for balance.                              Cognition Arousal/Alertness: Awake/alert Behavior During Therapy: Anxious Overall Cognitive Status: Impaired/Different from baseline Area of Impairment: Attention;Memory;Safety/judgement;Awareness;Problem solving                   Current Attention Level: Sustained     Safety/Judgement: Decreased awareness of safety;Decreased awareness of deficits   Problem Solving: Requires verbal cues;Requires tactile cues;Slow processing        Exercises      General Comments        Pertinent Vitals/Pain Pain Assessment: 0-10 Pain Score: 4  Pain Location: back pain Pain Descriptors / Indicators: Discomfort Pain Intervention(s): Monitored during session;Repositioned    Home Living  Prior Function            PT Goals (current goals can now be found in the care plan section) Acute Rehab PT Goals Patient Stated Goal: Pt and wife want to figure out why he is having these episodes of shaking/tremors Potential to Achieve Goals: Fair Progress towards PT goals: Progressing toward goals    Frequency    Min 3X/week      PT Plan Current plan remains appropriate     Co-evaluation              AM-PAC PT "6 Clicks" Daily Activity  Outcome Measure  Difficulty turning over in bed (including adjusting bedclothes, sheets and blankets)?: Unable Difficulty moving from lying on back to sitting on the side of the bed? : Unable Difficulty sitting down on and standing up from a chair with arms (e.g., wheelchair, bedside commode, etc,.)?: Unable Help needed moving to and from a bed to chair (including a wheelchair)?: A Little Help needed walking in hospital room?: A Little Help needed climbing 3-5 steps with a railing? : A Little 6 Click Score: 12    End of Session Equipment Utilized During Treatment: Gait belt Activity Tolerance: Patient tolerated treatment well;Patient limited by fatigue Patient left: in chair;with call bell/phone within reach;with chair alarm set;with family/visitor present Nurse Communication: Mobility status PT Visit Diagnosis: Unsteadiness on feet (R26.81);Other abnormalities of gait and mobility (R26.89);Repeated falls (R29.6);Difficulty in walking, not elsewhere classified (R26.2)     Time: 1610-9604 PT Time Calculation (min) (ACUTE ONLY): 13 min  Charges:  $Gait Training: 8-22 mins                    G Codes:       Governor Rooks, PTA pager [phone]    Cristela Blue 04/09/2017, 4:20 PM

## 2017-04-09 NOTE — Progress Notes (Signed)
[address]       ,[place] [address]             [phone]      7 Days Post-Op Procedure(s) (LRB): MINIMALLY INVASIVE MITRAL VALVE REPAIR (MVR) with (Right) MINIMALLY INVASIVE MAZE PROCEDURE (N/A) TRANSESOPHAGEAL ECHOCARDIOGRAM (TEE) (N/A) CLIPPING OF ATRIAL APPENDAGE (N/A) PATENT FORAMEN OVALE (PFO) CLOSURE (N/A) Subjective: Feels well , ambulation improving , less cough/sputum production  Objective: Vital signs in last 24 hours: Temp:  [97.7 F (36.5 C)-99.1 F (37.3 C)] 98.8 F (37.1 C) (03/07 0444) Pulse Rate:  [70-104] 84 (03/07 0444) Cardiac Rhythm: Atrial fibrillation (03/07 0700) Resp:  [18-25] 18 (03/07 0444) BP: (117-158)/(53-84) 122/59 (03/07 0444) SpO2:  [96 %-100 %] 96 % (03/07 0444) Weight:  [187 lb 2.7 oz (84.9 kg)] 187 lb 2.7 oz (84.9 kg) (03/07 0444)  Hemodynamic parameters for last 24 hours:    Intake/Output from previous day: 03/06 0701 - 03/07 0700 In: 720 [P.O.:720] Out: -  Intake/Output this shift: No intake/output data recorded.  General appearance: alert, cooperative and no distress Heart: irregularly irregular rhythm Lungs: clear to auscultation bilaterally Abdomen: benoogn Extremities: min edema Wound: incis healing well  Lab Results: Recent Labs    04/07/17 0232  WBC 6.3  HGB 9.6*  HCT 28.0*  PLT 143*   BMET:  Recent Labs    04/08/17 0237 04/09/17 0330  NA 135 134*  K 3.2* 4.1  CL 96* 98*  CO2 29 30  GLUCOSE 122* 103*  BUN 14 13  CREATININE 1.34* 1.41*  CALCIUM 8.4* 8.0*    PT/INR:  Recent Labs    04/09/17 0330  LABPROT 22.6*  INR 2.01   ABG    Component Value Date/Time   PHART 7.330 (L) 04/02/2017 2303   HCO3 25.8 04/02/2017 2303   TCO2 26 04/03/2017 1802   ACIDBASEDEF 3.0 (H) 04/02/2017 2202   O2SAT 97.0 04/02/2017 2303   CBG (last 3)  Recent Labs    04/08/17 1719 04/08/17 2131 04/09/17 0616  GLUCAP 112* 120* 111*    Meds Scheduled Meds: . ALPRAZolam  0.25 mg Oral  QHS  . aspirin EC  81 mg Oral Daily  . atorvastatin  80 mg Oral q1800  . bisacodyl  10 mg Oral Daily   Or  . bisacodyl  10 mg Rectal Daily  . Chlorhexidine Gluconate Cloth  6 each Topical Daily  . docusate sodium  200 mg Oral Daily  . enoxaparin (LOVENOX) injection  40 mg Subcutaneous QHS  . furosemide  40 mg Oral Daily  . insulin aspart  0-15 Units Subcutaneous TID WC  . irbesartan  150 mg Oral Daily  . mouth rinse  15 mL Mouth Rinse BID  . metFORMIN  500 mg Oral BID WC  . pantoprazole  40 mg Oral Daily  . sertraline  50 mg Oral QHS  . warfarin  2.5 mg Oral q1800  . Warfarin - Physician Dosing Inpatient   Does not apply q1800   Continuous Infusions: . lactated ringers 20 mL/hr at 04/05/17 2000   PRN Meds:.ALPRAZolam, metoprolol tartrate, morphine injection, ondansetron (ZOFRAN) IV, phenol, traMADol  Xrays No results found.  Assessment/Plan: S/P Procedure(s) (LRB): MINIMALLY INVASIVE MITRAL VALVE REPAIR (MVR) with (Right) MINIMALLY INVASIVE MAZE PROCEDURE (N/A) TRANSESOPHAGEAL ECHOCARDIOGRAM (TEE) (N/A) CLIPPING OF ATRIAL APPENDAGE (N/A) PATENT FORAMEN OVALE (PFO) CLOSURE (N/A)  1 doing well 2 afib with CVR, BP control is better, INR 2.1 3 sugars controlled 4 creat up  slightly 5 CXR is stable in appearance 6 poss home today or tomorrow, will d/w MD  LOS: 7 days    [name] [name] 04/09/2017

## 2017-04-09 NOTE — Care Management Important Message (Signed)
Important Message  Patient Details  Name: [name] [name] MRN: [id number] Date of Birth: 1940/02/20   Medicare Important Message Given:  Yes    Erenest Rasher, RN 04/09/2017, 9:58 AM

## 2017-04-09 NOTE — Progress Notes (Addendum)
CARDIAC REHAB PHASE I   PRE:  Rate/Rhythm: 86 SR  BP:  Sitting: 151/83     SaO2: 99% RA  MODE:  Ambulation: 390 ft   POST:  Rate/Rhythm: 102 ST  BP:  Sitting: 160/81      SaO2: 94% RA  [id number] Pt assisted to stand and use urinal and then to [facility] for BM.  Wife assisted pt with pericare.  Pt ambulated 390 ft with RW followed with Rollator.  Verbal cues given for RW use and to maintain a slower pace. Pt assisted back to bed after walk. Asked PA to f/u with CM for [facility] PT/OT and equipment needs. Education provided to wife and patient about restrictions, IS use, and nutrition.  Verbalized understanding.  Encouraged pt to walk with assistance at home to maintain safety.  Wife verbalizes that she will be home with patient next week.  CM to see patient.   CRPII referral sent to [facility].  Noel Christmas, RN 04/09/2017 10:03 AM

## 2017-04-09 NOTE — Discharge Summary (Signed)
Physician Discharge Summary  Patient ID: [name] [name] MRN: [id number] DOB/AGE: 77-Aug-1942 77 y.o.  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 S/P minimally invasive maze operation for atrial fibrillation 04/02/2017  . Severe mitral insufficiency   . TIA (transient ischemic attack)   . Seasonal allergies   . Renal insufficiency   . Hypertension   . High cholesterol   . Glaucoma   . Type II diabetes mellitus with nephropathy ([place])   . Anxiety   . Allergy   . CKD (chronic kidney disease), stage III ([place]) 10/24/2016  . Atrial fibrillation ([place]) 10/24/2016  . Hypertension, essential, benign 10/23/2016  . Generalized anxiety disorder 10/23/2016  . Hyperlipidemia associated with type 2 diabetes mellitus ([place]) 10/23/2016  . Insomnia 10/23/2016  . GERD (gastroesophageal reflux disease) 10/23/2016   History  Patient is a 77 year old male with history of mitral regurgitation, recurrent persistent atrial fibrillation that has failed recent DC cardioversion, chronic diastolic congestive heart failure, hypertension, chronic kidney disease (stageII-III), hyperlipidemia, and GE reflux disease who has been referred for surgical consultation to discuss treatment options for management of mitral regurgitation and atrial fibrillation. The patient  states that he has been told that he had a heart murmur most of his life. Approximately 6-8 months ago the patient began to develop worsening symptoms of exertional shortness of breath. He was evaluated by his primary care physician and noted to be in atrial fibrillation. He was referred for cardiology consultation and evaluated by Dr. [name] initially on October 27, 2016. Baseline transthoracic echocardiogram performed October 31, 2016 revealed normal left ventricular systolic function with some restriction of the posterior leaflet motion and mitral regurgitation that was "not well characterized" butfelt to be mild in severity. The patient was anticoagulated using Eliquisand brought in for elective TEE and cardioversion on January 02, 2017. TEE revealed severe mitral regurgitation but there was no sign of left atrial thrombus. The patient underwent DC cardioversion but did notmaintain.Left and right heart catheterization performed February 13, 2017 revealed normal coronary artery anatomy with minimal nonobstructive coronary artery disease. Catheterization confirmed the presence of severe mitral regurgitation. Pulmonary artery pressures were mildly elevated. He was seen in follow-up by Dr. [name] on February 18, 2017 and subsequently started on amiodarone. Surgical consultation was requested.  The patient has been seen in consultation and counseled at length regarding the indications, risks and potential benefits of surgery.  All questions have been answered, and the patient provides full informed consent for the operation as described.   Patient was admitted for the procedure.  Discharged Condition: good  Hospital Course: The patient was admitted electively and on 04/02/2017 he was taken to the operating room where he underwent the below described procedure.  He tolerated it well and was taken to the surgical intensive care unit in stable condition.  Postop hospital course:  The  patient has progressed nicely.  He  was extubated on the evening of surgery using standard protocols.  He initially required some low-dose dopamine support and isDDD pacing .  He did have an expected acute blood loss anemia which is required transfusion and stabilized.  Most recent hemoglobin and hematocrit are 9.6 and 28.0 respectively.  He has been started on Coumadin postoperatively.  Blood sugars have been managed in the usual manner over time from insulin drip to transition to sliding scale.  At time of discharge he is on metformin.  Blood pressure has been elevated at times and he has been started on an ARB.  This is steadily improved.  He has had postoperative atrial fibrillation and rate is controlled.  He has had postoperative volume overload and some acute renal insufficiency which is steadily improved over time.  Most recent creatinine is 1.34.  Incisions are noted to be healing well.  He is tolerating gradually increasing activity using cardiac rehab protocols.  At the time of discharge the patient is felt to be quite stable.   Consults: None  Significant Diagnostic Studies: Routine postoperative labs and serial chest x-rays.  Treatments: surgery:   Date of Procedure:                            04/02/2017  Preoperative Diagnosis:         Severe Mitral Regurgitation  Recurrent Persistent Atrial Fibrillation  Postoperative Diagnosis:    Same  Procedure:        Minimally-Invasive Mitral Valve Repair             Complex valvuloplasty including artificial Gore-tex neochord placement x6             Sorin Memo 3D Ring Annuloplasty (size 28 mm, catalog #[id number], serial #[id number])             Closure of patent foramen ovale   Minimally-Invasive Maze Procedure             Complete bilateral atrial lesion set using cryothermy and bipolar radiofrequency ablation             Clipping of Left Atrial Appendage (Atricure left atrial clip, size 45 mm)             Endocardial oversewing of  Left atrial appendage stump  Surgeon:        Valentina Gu. Roxy Manns, MD  Assistant:       Ellwood Handler, PA-C  Anesthesia:    Roberts Gaudy, MD  Operative Findings: ? Fibroelastic deficiency type myxomatous degenerative disease ? Multiple elongated primary chordae tendinae with prolapse of anterior leaflet ? Annular dilitation ? Type I and type II mitral valve dysfunction with severe mitral regurgitation ? Normal left ventricular systolic function ? Severe left atrial enlargement ? Trace residual mitral regurgitation after successful valve repair   Discharge Exam: Blood pressure (!) 122/59, pulse 84, temperature 98.8 F (37.1 C), temperature source Oral, resp. rate 18, height 5\' 5"  (1.651 m), weight 187 lb 2.7 oz (84.9 kg), SpO2 96 %.  General appearance: alert, cooperative and no distress Heart: irregularly irregular rhythm Lungs: clear to auscultation bilaterally Abdomen: benoogn Extremities: min edema Wound: incis healing well    Disposition: 01-Home or Self Care   Allergies as of 04/09/2017      Reactions   Penicillins Anaphylaxis   Has patient had a PCN reaction causing immediate rash, facial/tongue/throat swelling, SOB or lightheadedness with hypotension: yes Has patient had a PCN reaction causing severe  rash involving mucus membranes or skin necrosis: no Has patient had a PCN reaction that required hospitalization: no Has patient had a PCN reaction occurring within the last 10 years: no If all of the above answers are "NO", then may proceed with Cephalosporin use.   Shellfish Allergy Anaphylaxis   Fenofibrate    UNSPECIFIED REACTION  "Dr told pt to discontinue med"   Amiodarone Nausea And Vomiting      Medication List    STOP taking these medications   acetaminophen 500 MG tablet Commonly known as:  TYLENOL   amLODipine 5 MG tablet Commonly known as:  NORVASC   apixaban 5 MG Tabs tablet Commonly known as:  ELIQUIS   metoprolol succinate 50 MG 24 hr  tablet Commonly known as:  TOPROL-XL     TAKE these medications   ALPRAZolam 0.25 MG tablet Commonly known as:  XANAX Take 0.25 mg by mouth daily.   aspirin 81 MG EC tablet Take 1 tablet (81 mg total) by mouth daily. Start taking on:  04/10/2017   atorvastatin 80 MG tablet Commonly known as:  LIPITOR Take 80 mg by mouth daily.   BILBERRY PO Take 1 capsule by mouth daily.   Co Q 10 100 MG Caps Take 100 mg by mouth daily.   furosemide 40 MG tablet Commonly known as:  LASIX Take 0.5 tablets (20 mg total) by mouth every other day.   irbesartan 150 MG tablet Commonly known as:  AVAPRO Take 1 tablet (150 mg total) by mouth daily. Start taking on:  04/10/2017 What changed:    medication strength  how much to take   Krill Oil 500 MG Caps Take 500 mg by mouth daily.   L-Arginine 500 MG Tabs Take 500 mg by mouth daily.   metFORMIN 500 MG tablet Commonly known as:  GLUCOPHAGE Take 1 tablet (500 mg total) by mouth 2 (two) times daily with a meal.   multivitamin with minerals tablet Take 1 tablet by mouth daily.   sertraline 50 MG tablet Commonly known as:  ZOLOFT Take 50 mg by mouth daily.   traMADol 50 MG tablet Commonly known as:  ULTRAM Take 1 tablet (50 mg total) by mouth every 6 (six) hours as needed for moderate pain.   warfarin 2.5 MG tablet Commonly known as:  COUMADIN Take 1 tablet (2.5 mg total) by mouth daily at 6 PM. As directed by coumadin cllinic      Follow-up Information    Rexene Alberts, MD Follow up.   Specialty:  Cardiothoracic Surgery Why:  Appointment to see the PA on 04/27/2017 at 3 PM.  Please obtain a chest x-ray at [place] at 2:30 PM.  [facility] imaging is located in the same office complex on the first floor. Contact information: [address] [place] [address] [phone]        [facility] [place] Follow up.   Specialty:  Cardiothoracic Surgery Why:  Appointment  to see the nurse for suture removal on 04/16/2017 at 10 AM. Contact information: [place], [place] [phone]       Lelon Perla, MD Follow up.   Specialty:  Cardiology Why:  A cardiology appointment is being arranged as well as Coumadin clinic.  They will contact you with the time and date of these appointments.  If you do not hear from them by next Monday please call to arrange. Contact information: [place]  [address] [place] [address] [phone]          The patient has been discharged on:   1.Beta Blocker:  Yes [   ]                              No   [n   ]                              If No, reason:early post op bradycardia anf heart block, rate controlled afib currently  2.Ace Inhibitor/ARB: Yes Blue.Reese   ]                                     No  [    ]                                     If No, reason:  3.Statin:   Yes [ y  ]                  No  [   ]                  If No, reason:  4.Ecasa:  Yes  [ y  ]                  No   [   ]                  If No, reason:  Signed: [name] 04/09/2017, 9:16 AM

## 2017-04-09 NOTE — Progress Notes (Signed)
Inpatient rehab admissions - I spoke with patient and his wife.  Patient wants to DC home with [facility].  He does not want to come to CIR.  I will sign off for inpatient rehab at this time.  Call me for questions.  #[phone]

## 2017-04-09 NOTE — Care Management Note (Signed)
Case Management Note  Patient Details  Name: [name] [name] MRN: [id number] Date of Birth: 1940-04-08  Subjective/Objective:    MVR, Maze                Action/Plan: Spoke to pt and wife at bedside. Offered choice for HH/list provided. Wife agreeable to [facility] for [facility]. Contacted rep with new referral. Contacted [facility] for DME for home. Attending notified for [facility] orders with [facility].   Expected Discharge Date:  04/09/2017                Expected Discharge Plan:  [place]  In-House Referral:  NA  Discharge planning Services  CM Consult  Post Acute Care Choice:  Home Health Choice offered to:  Patient, Spouse  DME Arranged:  3-N-1, Walker rolling DME Agency:  [place]:  PT, RN, Nurse's Aide, OT HH Agency:  [facility]  Status of Service:     If discussed at [employer], dates discussed:    Additional Comments:  Erenest Rasher, RN 04/09/2017, 10:06 AM

## 2017-04-10 ENCOUNTER — Telehealth (HOSPITAL_COMMUNITY): Payer: Self-pay

## 2017-04-10 NOTE — Telephone Encounter (Signed)
Patients insurance is active and benefits verified through [facility] - $20.00 co-pay, no deductible, out of pocket amount of $4,500/$296.26 has been met, no co-insurance, and no pre-authorization is required. Passport/reference [id number]  Will contact patient to see if interested in CR. If interested, patient will need to complete follow up appt. Once completed, patient will be contacted for scheduling upon review by the RN Navigator.

## 2017-04-13 ENCOUNTER — Telehealth: Payer: Self-pay | Admitting: Cardiology

## 2017-04-13 ENCOUNTER — Ambulatory Visit (INDEPENDENT_AMBULATORY_CARE_PROVIDER_SITE_OTHER): Payer: Medicare Other | Admitting: Pharmacist

## 2017-04-13 DIAGNOSIS — I4819 Other persistent atrial fibrillation: Secondary | ICD-10-CM

## 2017-04-13 DIAGNOSIS — Z9889 Other specified postprocedural states: Secondary | ICD-10-CM

## 2017-04-13 DIAGNOSIS — I481 Persistent atrial fibrillation: Secondary | ICD-10-CM | POA: Diagnosis not present

## 2017-04-13 LAB — POCT INR: INR: 4.8

## 2017-04-13 NOTE — Telephone Encounter (Signed)
Mickel Baas calling with [facility] to see when she would start PT INR. Please follow-up thanks.

## 2017-04-13 NOTE — Telephone Encounter (Signed)
PATIENT HAS BEEN HAVING DIARRHEA AND WEAK ALL WEEKEND. SCHEDULE TO HAVE AN APPOINTMENT TODAY . WIFE DOES NOT THINK HE CAN COME INTO TODAY .  INSTRUCTED [place]

## 2017-04-13 NOTE — Telephone Encounter (Signed)
New Message   [name] [name] at [place] verbalized that the pt is having diarrhea, weight is down to 139, he is not eating, and his wife is concerned. Please call

## 2017-04-13 NOTE — Telephone Encounter (Signed)
1st coumadin clinic visit completed today. Will initiated INR POC checks with [facility] tomorrow 04/14/17   Orders given to Mickel Baas by phone   Anginette Espejo Rodriguez-Guzman PharmD, BCPS, [place] [address] [place] [address] 04/13/2017 4:25 PM

## 2017-04-13 NOTE — Telephone Encounter (Signed)
LMOM; Mickel Baas to call back @ [phone] to clarify questions and for any additional orders needed.

## 2017-04-14 ENCOUNTER — Ambulatory Visit (INDEPENDENT_AMBULATORY_CARE_PROVIDER_SITE_OTHER): Payer: Medicare Other | Admitting: Pharmacist Clinician (PhC)/ Clinical Pharmacy Specialist

## 2017-04-14 ENCOUNTER — Telehealth: Payer: Self-pay

## 2017-04-14 DIAGNOSIS — Z7901 Long term (current) use of anticoagulants: Secondary | ICD-10-CM

## 2017-04-14 DIAGNOSIS — I481 Persistent atrial fibrillation: Secondary | ICD-10-CM | POA: Diagnosis not present

## 2017-04-14 DIAGNOSIS — Z9889 Other specified postprocedural states: Secondary | ICD-10-CM

## 2017-04-14 DIAGNOSIS — I4819 Other persistent atrial fibrillation: Secondary | ICD-10-CM

## 2017-04-14 LAB — PROTIME-INR: INR: 5.5 — AB (ref 0.9–1.1)

## 2017-04-14 NOTE — Telephone Encounter (Signed)
Attempted to call Mickel Baas, nurse with [facility].  LVM to call back d/t Mr. Hulse not feeling well.  Phone has been busy after several attempts.  Will call again later.

## 2017-04-16 ENCOUNTER — Ambulatory Visit (INDEPENDENT_AMBULATORY_CARE_PROVIDER_SITE_OTHER): Payer: Medicare Other | Admitting: Pharmacist

## 2017-04-16 ENCOUNTER — Ambulatory Visit (INDEPENDENT_AMBULATORY_CARE_PROVIDER_SITE_OTHER): Payer: Self-pay

## 2017-04-16 ENCOUNTER — Other Ambulatory Visit: Payer: Self-pay

## 2017-04-16 DIAGNOSIS — Z9889 Other specified postprocedural states: Secondary | ICD-10-CM

## 2017-04-16 DIAGNOSIS — Z4802 Encounter for removal of sutures: Secondary | ICD-10-CM

## 2017-04-16 DIAGNOSIS — Z7901 Long term (current) use of anticoagulants: Secondary | ICD-10-CM | POA: Diagnosis not present

## 2017-04-16 DIAGNOSIS — I4819 Other persistent atrial fibrillation: Secondary | ICD-10-CM

## 2017-04-16 LAB — POCT INR: INR: 2.1

## 2017-04-16 NOTE — Progress Notes (Signed)
Patient arrived for nurse visit to remove 2 sutures post- procedure of MVR/ Maze/ PFO closure 04/02/2017.  Sutures removed with no signs/ symptoms of infection noted.  Sutures were tight, little difficulty removing.  Once removed, there was a 0.5 inch depth opening.  Site was cleaned with peroxide and wound wrapped with gauze and primapore tape.  Patient tolerated procedure well even though it was painful.  Patient/ family instructed to keep the incision sites clean and dry. Also advised to give the office a call if redness, red streaks, puss, or foul odor coming from the incision sites.  Patient/ family acknowledged instructions given. Will return for f/u appointment with Dr. Roxy Manns.

## 2017-04-21 ENCOUNTER — Ambulatory Visit (INDEPENDENT_AMBULATORY_CARE_PROVIDER_SITE_OTHER): Payer: Medicare Other | Admitting: Pharmacist Clinician (PhC)/ Clinical Pharmacy Specialist

## 2017-04-21 DIAGNOSIS — I481 Persistent atrial fibrillation: Secondary | ICD-10-CM | POA: Diagnosis not present

## 2017-04-21 DIAGNOSIS — Z9889 Other specified postprocedural states: Secondary | ICD-10-CM

## 2017-04-21 DIAGNOSIS — Z7901 Long term (current) use of anticoagulants: Secondary | ICD-10-CM

## 2017-04-21 DIAGNOSIS — I4819 Other persistent atrial fibrillation: Secondary | ICD-10-CM

## 2017-04-21 LAB — POCT INR: INR: 1.7

## 2017-04-24 ENCOUNTER — Other Ambulatory Visit: Payer: Self-pay | Admitting: Thoracic Surgery (Cardiothoracic Vascular Surgery)

## 2017-04-24 DIAGNOSIS — Q8789 Other specified congenital malformation syndromes, not elsewhere classified: Secondary | ICD-10-CM

## 2017-04-27 ENCOUNTER — Ambulatory Visit
Admission: RE | Admit: 2017-04-27 | Discharge: 2017-04-27 | Disposition: A | Discharge status: Home / Self Care | Payer: Medicare Other | Source: Ambulatory Visit | Attending: Thoracic Surgery (Cardiothoracic Vascular Surgery) | Admitting: Thoracic Surgery (Cardiothoracic Vascular Surgery)

## 2017-04-27 ENCOUNTER — Ambulatory Visit (INDEPENDENT_AMBULATORY_CARE_PROVIDER_SITE_OTHER): Payer: Medicare Other | Admitting: Pharmacist Clinician (PhC)/ Clinical Pharmacy Specialist

## 2017-04-27 ENCOUNTER — Other Ambulatory Visit: Payer: Self-pay

## 2017-04-27 ENCOUNTER — Ambulatory Visit (INDEPENDENT_AMBULATORY_CARE_PROVIDER_SITE_OTHER): Payer: Self-pay | Admitting: Physician Assistant

## 2017-04-27 ENCOUNTER — Encounter: Payer: Self-pay | Admitting: Physician Assistant

## 2017-04-27 VITALS — BP 123/77 | HR 100 | Resp 16 | Ht 65.0 in | Wt 167.0 lb

## 2017-04-27 DIAGNOSIS — Z7901 Long term (current) use of anticoagulants: Secondary | ICD-10-CM | POA: Diagnosis not present

## 2017-04-27 DIAGNOSIS — Z9889 Other specified postprocedural states: Secondary | ICD-10-CM

## 2017-04-27 DIAGNOSIS — I481 Persistent atrial fibrillation: Secondary | ICD-10-CM

## 2017-04-27 DIAGNOSIS — Q8789 Other specified congenital malformation syndromes, not elsewhere classified: Secondary | ICD-10-CM

## 2017-04-27 DIAGNOSIS — I4819 Other persistent atrial fibrillation: Secondary | ICD-10-CM

## 2017-04-27 LAB — POCT INR: INR: 2.1

## 2017-04-27 MED ORDER — ONDANSETRON HCL 4 MG PO TABS: 4.0000 mg | ORAL_TABLET | Freq: Three times a day (TID) | ORAL | 0 refills | Status: DC | PRN

## 2017-04-27 MED ORDER — TRAMADOL HCL 50 MG PO TABS: 50.0000 mg | ORAL_TABLET | Freq: Two times a day (BID) | ORAL | 0 refills | Status: AC | PRN
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
## 2017-04-27 NOTE — Patient Instructions (Signed)
You are encouraged to enroll and participate in the outpatient cardiac rehab program beginning as soon as practical.  You may return to driving an automobile as long as you are no longer requiring oral narcotic pain relievers during the daytime.  It would be wise to start driving only short distances during the daylight and gradually increase from there as you feel comfortable.  Make every effort to stay physically active, get some type of exercise on a regular basis, and stick to a "heart healthy diet".  The long term benefits for regular exercise and a healthy diet are critically important to your overall health and wellbeing.  Endocarditis is a potentially serious infection of heart valves or inside lining of the heart.  It occurs more commonly in patients with diseased heart valves (such as patient's with aortic or mitral valve disease) and in patients who have undergone heart valve repair or replacement.  Certain surgical and dental procedures may put you at risk, such as dental cleaning, other dental procedures, or any surgery involving the respiratory, urinary, gastrointestinal tract, gallbladder or prostate gland.   To minimize your chances for develooping endocarditis, maintain good oral health and seek prompt medical attention for any infections involving the mouth, teeth, gums, skin or urinary tract.    Always notify your doctor or dentist about your underlying heart valve condition before having any invasive procedures. You will need to take antibiotics before certain procedures, including all routine dental cleanings or other dental procedures.  Your cardiologist or dentist should prescribe these antibiotics for you to be taken ahead of time.

## 2017-04-28 ENCOUNTER — Ambulatory Visit: Payer: Medicare Other | Admitting: Adult Health

## 2017-04-28 ENCOUNTER — Encounter: Payer: Self-pay | Admitting: Adult Health

## 2017-04-28 VITALS — BP 130/58 | Ht 64.0 in | Wt 171.6 lb

## 2017-04-28 DIAGNOSIS — I4891 Unspecified atrial fibrillation: Secondary | ICD-10-CM

## 2017-04-28 DIAGNOSIS — I10 Essential (primary) hypertension: Secondary | ICD-10-CM | POA: Diagnosis not present

## 2017-04-28 DIAGNOSIS — Z9889 Other specified postprocedural states: Secondary | ICD-10-CM

## 2017-04-28 NOTE — Patient Instructions (Signed)
Medication Instructions:  NO CHANGES- Your physician recommends that you continue on your current medications as directed. Please refer to the Current Medication list given to you today.  If you need a refill on your cardiac medications before your next appointment, please call your pharmacy.  Special Instructions: Refer to cardiac rehab at [facility] in [place]: Your physician wants you to follow-up in: [place].   Thank you for choosing [facility] at [facility]!!
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
## 2017-04-30 ENCOUNTER — Telehealth: Payer: Self-pay | Admitting: Adult Health

## 2017-04-30 ENCOUNTER — Other Ambulatory Visit: Payer: Self-pay

## 2017-04-30 DIAGNOSIS — I340 Nonrheumatic mitral (valve) insufficiency: Secondary | ICD-10-CM

## 2017-04-30 DIAGNOSIS — Z952 Presence of prosthetic heart valve: Secondary | ICD-10-CM

## 2017-04-30 NOTE — Telephone Encounter (Signed)
Pt wife notified that [facility] does not have cardiac and that [facility] will be calling to schedule appts

## 2017-04-30 NOTE — Telephone Encounter (Signed)
New Message:    Pt is returning a phone call about procedure instructions

## 2017-04-30 NOTE — Telephone Encounter (Signed)
Routed to Michelle

## 2017-04-30 NOTE — Progress Notes (Signed)
NOT NEEDED

## 2017-05-04 ENCOUNTER — Ambulatory Visit (INDEPENDENT_AMBULATORY_CARE_PROVIDER_SITE_OTHER): Payer: Medicare Other | Admitting: Pharmacist

## 2017-05-04 DIAGNOSIS — Z9889 Other specified postprocedural states: Secondary | ICD-10-CM | POA: Diagnosis not present

## 2017-05-04 DIAGNOSIS — Z7901 Long term (current) use of anticoagulants: Secondary | ICD-10-CM

## 2017-05-04 DIAGNOSIS — I4819 Other persistent atrial fibrillation: Secondary | ICD-10-CM

## 2017-05-04 DIAGNOSIS — I481 Persistent atrial fibrillation: Secondary | ICD-10-CM | POA: Diagnosis not present

## 2017-05-04 LAB — POCT INR: INR: 2.8

## 2017-05-11 ENCOUNTER — Ambulatory Visit (INDEPENDENT_AMBULATORY_CARE_PROVIDER_SITE_OTHER): Payer: Medicare Other | Admitting: Pharmacist Clinician (PhC)/ Clinical Pharmacy Specialist

## 2017-05-11 DIAGNOSIS — Z9889 Other specified postprocedural states: Secondary | ICD-10-CM | POA: Diagnosis not present

## 2017-05-11 DIAGNOSIS — I481 Persistent atrial fibrillation: Secondary | ICD-10-CM

## 2017-05-11 DIAGNOSIS — Z7901 Long term (current) use of anticoagulants: Secondary | ICD-10-CM | POA: Diagnosis not present

## 2017-05-11 DIAGNOSIS — I4819 Other persistent atrial fibrillation: Secondary | ICD-10-CM

## 2017-05-11 LAB — POCT INR: INR: 4

## 2017-05-12 ENCOUNTER — Telehealth: Payer: Self-pay | Admitting: Cardiology

## 2017-05-12 NOTE — Telephone Encounter (Signed)
Spoke with pt wife, his weight is stable at this time but is down 30 lbs. He is also not resting, twitching all the time. Explained to wife is common for patients to loose their appetite after open heart surgery. Encouraged her to give him anything he can eat at this point, just watch his salt. He is unable to drink milk shakes or boost due to lactose intolerance. Patient will try using tylenol pm to help with sleep. She will call back if needed.

## 2017-05-12 NOTE — Telephone Encounter (Signed)
New Message:    Please call,pt is not eating,not sleeping and he have lost 30lbs.

## 2017-05-18 ENCOUNTER — Ambulatory Visit (INDEPENDENT_AMBULATORY_CARE_PROVIDER_SITE_OTHER): Payer: Medicare Other | Admitting: Pharmacist Clinician (PhC)/ Clinical Pharmacy Specialist

## 2017-05-18 DIAGNOSIS — I481 Persistent atrial fibrillation: Secondary | ICD-10-CM

## 2017-05-18 DIAGNOSIS — Z9889 Other specified postprocedural states: Secondary | ICD-10-CM | POA: Diagnosis not present

## 2017-05-18 DIAGNOSIS — Z7901 Long term (current) use of anticoagulants: Secondary | ICD-10-CM | POA: Diagnosis not present

## 2017-05-18 DIAGNOSIS — I4819 Other persistent atrial fibrillation: Secondary | ICD-10-CM

## 2017-05-18 LAB — POCT INR: INR: 2.2

## 2017-05-21 ENCOUNTER — Telehealth (HOSPITAL_COMMUNITY): Payer: Self-pay

## 2017-05-21 NOTE — Telephone Encounter (Signed)
Called and spoke with wife of patient to follow up with patient in regards to [name] - She stated he is still receiving HHPT and has a couple of more sessions. Wife stated he is still very weak and has lost 30lbs. She stated he has not lost anymore but he also is not gaining. Will follow up with wife in a couple of weeks.

## 2017-05-25 ENCOUNTER — Ambulatory Visit (INDEPENDENT_AMBULATORY_CARE_PROVIDER_SITE_OTHER): Payer: Medicare Other | Admitting: Pharmacist

## 2017-05-25 DIAGNOSIS — Z9889 Other specified postprocedural states: Secondary | ICD-10-CM | POA: Diagnosis not present

## 2017-05-25 DIAGNOSIS — I4819 Other persistent atrial fibrillation: Secondary | ICD-10-CM

## 2017-05-25 DIAGNOSIS — Z7901 Long term (current) use of anticoagulants: Secondary | ICD-10-CM

## 2017-05-25 LAB — POCT INR: INR: 2.2
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
## 2017-05-27 ENCOUNTER — Telehealth: Payer: Self-pay | Admitting: Cardiology

## 2017-05-27 NOTE — Telephone Encounter (Signed)
[name] [name] is with pt and stated the pt's BP is low left arm: 110/56//rt arm: 98/56 Temp 98.5 Pulse oxgen 95 Pulse 104 He is asympathic Pt took bp meds this morning and was instructed to drink fluids by nurse. Please advise [name] [name]

## 2017-05-27 NOTE — Telephone Encounter (Signed)
Left message for pt to call.

## 2017-05-27 NOTE — Telephone Encounter (Signed)
Spoke with pt wife, she reports he is feeling better now after drinking water and eating. He is still taking avapro 150 mg once daily and his wife wonders since he has lost 30 lbs. He has a follow up appointment on Friday this week and will bring blood pressure readings to that appointment. Will forward for dr [name] review to see if medication adjustment need to be made prior to follow up.

## 2017-05-27 NOTE — Telephone Encounter (Signed)
Spoke with pt wife, Aware of dr crenshaw's recommendations.  

## 2017-05-27 NOTE — Telephone Encounter (Signed)
Continue present meds and will review at [facility]

## 2017-05-29 ENCOUNTER — Encounter: Payer: Self-pay | Admitting: Cardiology

## 2017-05-29 ENCOUNTER — Ambulatory Visit: Payer: Medicare Other | Admitting: Cardiology

## 2017-05-29 VITALS — BP 126/68 | HR 104 | Ht 64.0 in | Wt 157.0 lb

## 2017-05-29 DIAGNOSIS — I480 Paroxysmal atrial fibrillation: Secondary | ICD-10-CM | POA: Diagnosis not present

## 2017-05-29 DIAGNOSIS — Z952 Presence of prosthetic heart valve: Secondary | ICD-10-CM

## 2017-05-29 DIAGNOSIS — I5032 Chronic diastolic (congestive) heart failure: Secondary | ICD-10-CM

## 2017-05-29 DIAGNOSIS — I10 Essential (primary) hypertension: Secondary | ICD-10-CM

## 2017-05-29 LAB — BASIC METABOLIC PANEL
BUN/Creatinine Ratio: 13 (ref 10–24)
BUN: 17 mg/dL (ref 8–27)
CALCIUM: 9.4 mg/dL (ref 8.6–10.2)
CO2: 26 mmol/L (ref 20–29)
Chloride: 99 mmol/L (ref 96–106)
Creatinine, Ser: 1.32 mg/dL — ABNORMAL HIGH (ref 0.76–1.27)
GFR calc non Af Amer: 52 mL/min/{1.73_m2} — ABNORMAL LOW (ref >59)
GFR, EST AFRICAN AMERICAN: 60 mL/min/{1.73_m2} (ref >59)
Glucose: 139 mg/dL — ABNORMAL HIGH (ref 65–99)
Potassium: 4.2 mmol/L (ref 3.5–5.2)
Sodium: 137 mmol/L (ref 134–144)

## 2017-05-29 NOTE — Patient Instructions (Signed)
Medication Instructions:   NO CHANGE  Labwork:  Your physician recommends that you HAVE LAB WORK TODAY  Testing/Procedures:  Your physician has requested that you have an echocardiogram. Echocardiography is a painless test that uses sound waves to create images of your heart. It provides your doctor with information about the size and shape of your heart and how well your heart's chambers and valves are working. This procedure takes approximately one hour. There are no restrictions for this procedure.    Follow-Up:  Your physician recommends that you schedule a follow-up appointment in: [place]   If you need a refill on your cardiac medications before your next appointment, please call your pharmacy.

## 2017-06-01 ENCOUNTER — Ambulatory Visit (INDEPENDENT_AMBULATORY_CARE_PROVIDER_SITE_OTHER): Payer: Medicare Other | Admitting: Pharmacist

## 2017-06-01 ENCOUNTER — Telehealth: Payer: Self-pay | Admitting: Pharmacist

## 2017-06-01 DIAGNOSIS — I4819 Other persistent atrial fibrillation: Secondary | ICD-10-CM

## 2017-06-01 DIAGNOSIS — I481 Persistent atrial fibrillation: Secondary | ICD-10-CM

## 2017-06-01 DIAGNOSIS — Z9889 Other specified postprocedural states: Secondary | ICD-10-CM

## 2017-06-01 DIAGNOSIS — Z7901 Long term (current) use of anticoagulants: Secondary | ICD-10-CM | POA: Diagnosis not present

## 2017-06-01 LAB — POCT INR: INR: 1.5

## 2017-06-01 NOTE — Telephone Encounter (Signed)
DC coumadin; apixaban 5 mg BID Brian Crenshaw  

## 2017-06-01 NOTE — Telephone Encounter (Signed)
Patient INR reported as 1.5 by Home Health nurse today.  I instructed Mr [name] [name] to increase his warfarin dose today and follow up in 1 week at our clinic. Patient will be discharged from [facility] services this week.   Mr [name] [name] completed and emphatically refuses to increase his warfarin dose. Will not agree on coming to coumadin clinic either.  We discussed potential failure of valve repair, risks of clot formation, and risk of stroke with INR < 2.   He insistent on not increasing his warfarin dose ("is a dangerous drug"). Patient on warfarin therapy for only 3 months after valve replacement, then plan to resume Eliquis.  Any additional recommendations at this time?

## 2017-06-02 ENCOUNTER — Ambulatory Visit: Payer: Self-pay | Admitting: Pharmacist

## 2017-06-02 DIAGNOSIS — Z9889 Other specified postprocedural states: Secondary | ICD-10-CM

## 2017-06-02 DIAGNOSIS — I4819 Other persistent atrial fibrillation: Secondary | ICD-10-CM

## 2017-06-02 MED ORDER — APIXABAN 5 MG PO TABS: 5.0000 mg | ORAL_TABLET | Freq: Two times a day (BID) | ORAL | 1 refills | Status: DC

## 2017-06-02 NOTE — Telephone Encounter (Signed)
Patient notified of current change. Rx for Eliquis 5mg  sent to prefer pharmacy.   Coumadin clinic f/u appointment  Was cancelled and encounter closed.

## 2017-06-04 ENCOUNTER — Telehealth: Payer: Self-pay

## 2017-06-04 NOTE — Telephone Encounter (Signed)
Diane, PT with [facility] called and stated that she has discharged [name] [name] from [facility] and he (within the next week) will be ready for cardiac rehab.  Patient will return for a f/u appointment with Dr. Roxy Manns  06/22/2017 to evaluate he is ready for rehab.

## 2017-06-05 ENCOUNTER — Ambulatory Visit (HOSPITAL_COMMUNITY): Payer: Medicare Other | Attending: Cardiology

## 2017-06-05 ENCOUNTER — Other Ambulatory Visit: Payer: Self-pay

## 2017-06-05 DIAGNOSIS — E785 Hyperlipidemia, unspecified: Secondary | ICD-10-CM | POA: Diagnosis not present

## 2017-06-05 DIAGNOSIS — I129 Hypertensive chronic kidney disease with stage 1 through stage 4 chronic kidney disease, or unspecified chronic kidney disease: Secondary | ICD-10-CM | POA: Diagnosis not present

## 2017-06-05 DIAGNOSIS — E1122 Type 2 diabetes mellitus with diabetic chronic kidney disease: Secondary | ICD-10-CM | POA: Insufficient documentation

## 2017-06-05 DIAGNOSIS — Z48812 Encounter for surgical aftercare following surgery on the circulatory system: Secondary | ICD-10-CM | POA: Diagnosis present

## 2017-06-05 DIAGNOSIS — I4891 Unspecified atrial fibrillation: Secondary | ICD-10-CM | POA: Insufficient documentation

## 2017-06-05 DIAGNOSIS — Z952 Presence of prosthetic heart valve: Secondary | ICD-10-CM | POA: Diagnosis not present

## 2017-06-05 DIAGNOSIS — N183 Chronic kidney disease, stage 3 (moderate): Secondary | ICD-10-CM | POA: Diagnosis not present

## 2017-06-05 DIAGNOSIS — Z8673 Personal history of transient ischemic attack (TIA), and cerebral infarction without residual deficits: Secondary | ICD-10-CM | POA: Insufficient documentation

## 2017-06-07 ENCOUNTER — Other Ambulatory Visit: Payer: Self-pay | Admitting: Cardiology

## 2017-06-08 NOTE — Telephone Encounter (Signed)
REFILL 

## 2017-06-22 ENCOUNTER — Encounter: Payer: Medicare Other | Admitting: Thoracic Surgery (Cardiothoracic Vascular Surgery)

## 2017-07-02 ENCOUNTER — Telehealth: Payer: Self-pay | Admitting: Cardiology

## 2017-07-02 NOTE — Telephone Encounter (Signed)
Returned call wife (ok per DPR) who wanted an update on cardiac rehab.  She states he went to the initial evaluation and have not heard anything since.    Advised I am unsure but would follow up with cardiac rehab RN.   Patient would like to participate in cardiac rehab at the hospital.

## 2017-07-02 NOTE — Telephone Encounter (Signed)
New message  Spouse calling with questions about cardiac rehab

## 2017-07-06 ENCOUNTER — Other Ambulatory Visit: Payer: Self-pay

## 2017-07-06 ENCOUNTER — Ambulatory Visit (INDEPENDENT_AMBULATORY_CARE_PROVIDER_SITE_OTHER): Payer: Self-pay | Admitting: Thoracic Surgery (Cardiothoracic Vascular Surgery)

## 2017-07-06 ENCOUNTER — Encounter: Payer: Self-pay | Admitting: Thoracic Surgery (Cardiothoracic Vascular Surgery)

## 2017-07-06 VITALS — BP 131/71 | HR 95 | Resp 16 | Ht 64.0 in | Wt 152.0 lb

## 2017-07-06 DIAGNOSIS — Z9889 Other specified postprocedural states: Secondary | ICD-10-CM

## 2017-07-06 DIAGNOSIS — Z8679 Personal history of other diseases of the circulatory system: Secondary | ICD-10-CM

## 2017-07-06 NOTE — Progress Notes (Signed)
[address]       [place] [address]             [phone]     CARDIOTHORACIC SURGERY OFFICE NOTE  Referring Provider is [name], [name], MD PCP is Martinique, Betty G, MD   HPI:  Patient is a 77 year old male with history of mitral regurgitation, recurrent persistent atrial fibrillation that failed DC cardioversion, chronic diastolic congestive heart failure, hypertension, chronic kidney disease (stage II-III), hyperlipidemia, and GE reflux disease who returns the office today for routine follow-up status post minimally invasive mitral valve repair and Maze procedure on April 02, 2017.  His early postoperative recovery was uncomplicated and he was last seen here in our office on April 27, 2017.  Postoperatively he has had some problems with depression and weight loss.  He was seen in follow-up by Dr. [name] on May 29, 2017 at which time he remained in sinus rhythm.  Coumadin has been stopped and he is now anticoagulated using Eliquis.  He returns for office today and reports that overall he is doing well.  He notes that he still has intermittently a marginal appetite and he has lost a total of 45 pounds since his surgery.  Most of that weight came off within the first 2 weeks and probably represented fluid overload.  However, he has continued to lose some additional weight over the last 2 months.  He states that every once while he has periods of nausea and vomiting, but this is only occasional and intermittent.  He is fairly active physically although he has not yet started the cardiac rehab program.  He states that he walks every day.  He reports exertional shortness of breath only with more strenuous exertion and he notes that his breathing is much better than it was prior to surgery.  He has not had any tachypalpitations or dizzy spells.   Current Outpatient Medications  Medication Sig Dispense Refill  . acetaminophen (TYLENOL) 500 MG tablet Take 500 mg by  mouth every 6 (six) hours as needed.    . ALPRAZolam (XANAX) 0.25 MG tablet Take 0.25 mg by mouth daily.     Marland Kitchen apixaban (ELIQUIS) 5 MG TABS tablet Take 1 tablet (5 mg total) by mouth 2 (two) times daily. 60 tablet 1  . aspirin EC 81 MG EC tablet Take 1 tablet (81 mg total) by mouth daily.    Marland Kitchen atorvastatin (LIPITOR) 80 MG tablet Take 80 mg by mouth daily.     . Bilberry, Vaccinium myrtillus, (BILBERRY PO) Take 1 capsule by mouth daily.    . Coenzyme Q10 (CO Q 10) 100 MG CAPS Take 100 mg by mouth daily.     . furosemide (LASIX) 40 MG tablet Take 0.5 tablets (20 mg total) by mouth every other day. Take 40 mg daily until at preop weight , then go to 20 mg every other day as previously (Patient taking differently: Take 20 mg by mouth as needed. Take 40 mg daily until at preop weight , then go to 20 mg every other day as previously) 40 tablet 2  . irbesartan (AVAPRO) 150 MG tablet TAKE 1 TABLET BY MOUTH EVERY DAY 30 tablet 5  . Krill Oil 500 MG CAPS Take 500 mg by mouth daily.     Marland Kitchen L-Arginine 500 MG TABS Take 500 mg by mouth daily.     . Melatonin 5 MG CAPS Take 1 capsule by mouth at bedtime.    Marland Kitchen  metFORMIN (GLUCOPHAGE) 500 MG tablet Take 1 tablet (500 mg total) by mouth 2 (two) times daily with a meal.    . Multiple Vitamins-Minerals (MULTIVITAMIN WITH MINERALS) tablet Take 1 tablet by mouth daily.    . ondansetron (ZOFRAN) 4 MG tablet Take 1 tablet (4 mg total) by mouth every 8 (eight) hours as needed for nausea or vomiting. 20 tablet 0  . potassium chloride 20 MEQ TBCR Take 20 mEq by mouth daily. Take only on days you take lasix 30 tablet 1  . sertraline (ZOLOFT) 50 MG tablet Take 100 mg by mouth daily.      No current facility-administered medications for this visit.       Physical Exam:   BP 131/71 (BP Location: Right Arm, Patient Position: Sitting, Cuff Size: Normal)   Pulse 95   Resp 16   Ht 5\' 4"  (1.626 m)   Wt 152 lb (68.9 kg)   SpO2 97% Comment: ON RA  BMI 26.09 kg/m    General:  Well-appearing  Chest:   Clear to auscultation  CV:   Regular rate and rhythm with soft systolic murmur heard along left sternal border  Incisions:  Completely healed  Abdomen:  Soft nondistended  Extremities:  Warm and well-perfused, no edema  Diagnostic Tests:  2 channel telemetry rhythm strip performed in our office today demonstrates what appears to be sinus rhythm     Transthoracic Echocardiography  Patient:    [name] [name], [name] [name] MR #:       [id number] Study Date: 06/05/2017 Gender:     M Age:        77 Height:     162.6 cm Weight:     71.2 kg BSA:        1.81 m^2 Pt. Status: Room:   ATTENDING    Fransico Him, MD  [facility] Crenshaw  SONOGRAPHER  Wyatt Mage, RDCS  PERFORMING   [facility], Outpatient  cc:  ------------------------------------------------------------------- LV EF: 65% -   70%  ------------------------------------------------------------------- Indications:      MV repair (Z95.2).  ------------------------------------------------------------------- History:   PMH:   Atrial fibrillation.  Transient ischemic attack. Risk factors:  CKD stage 3. Hypertension. Diabetes mellitus. Dyslipidemia.  ------------------------------------------------------------------- Study Conclusions  - Left ventricle: The cavity size was normal. Wall thickness was   normal. Systolic function was vigorous. The estimated ejection   fraction was in the range of 65% to 70%. - Aortic valve: There was trivial regurgitation. - Mitral valve: s/p MV annuloplasty. No stenosis Trivial   regurgitation.   Calcified annulus. Mildly thickened leaflets . - Pulmonary arteries: PA peak pressure: 42 mm Hg (S).  ------------------------------------------------------------------- Labs, prior tests, procedures, and surgery: Transthoracic echocardiography (10/31/2016).     EF was 60%.  Valve surgery (04/02/2017).     Mitral  valve repair. Valvuloplasty including artificial Gore-tex neochord placement x6 with 28 mm Sorin Memo 3D ring annuloplasty via right mini thoracotomy approach. Surgery (04/02/2017).    Maze procedure.  ------------------------------------------------------------------- Study data:  Comparison was made to the study of 10/31/2016.  Study status:  Routine.  Procedure:  The patient reported no pain pre or post test. Transthoracic echocardiography. Image quality was adequate.  Study completion:  There were no complications. Transthoracic echocardiography.  M-mode, complete 2D, spectral Doppler, and color Doppler.  Birthdate:  Patient birthdate: 12/21/1940.  Age:  Patient is 77 yr old.  Sex:  Gender: male. BMI: 26.9 kg/m^2.  Blood pressure:  126/68  Patient status: Outpatient.  Study date:  Study date: 06/05/2017. Study time: 03:53 PM.  Location:  [facility]  -------------------------------------------------------------------  ------------------------------------------------------------------- Left ventricle:  The cavity size was normal. Wall thickness was normal. Systolic function was vigorous. The estimated ejection fraction was in the range of 65% to 70%.  ------------------------------------------------------------------- Aortic valve:   Mildly thickened leaflets.  Doppler:  There was trivial regurgitation.  ------------------------------------------------------------------- Mitral valve:  s/p MV annuloplasty. No stenosis Trivial regurgitation  Calcified annulus. Mildly thickened leaflets . Doppler:  There was trivial regurgitation.    Valve area by pressure half-time: 3.61 cm^2. Indexed valve area by pressure half-time: 1.99 cm^2/m^2. Valve area by continuity equation (using LVOT flow): 3.62 cm^2. Indexed valve area by continuity equation (using LVOT flow): 2 cm^2/m^2.    Mean gradient (D): 6 mm Hg. Peak gradient (D): 10 mm  Hg.  ------------------------------------------------------------------- Left atrium:  The atrium was normal in size.  ------------------------------------------------------------------- Right ventricle:  The cavity size was normal. Wall thickness was normal. Systolic function was normal.  ------------------------------------------------------------------- Pulmonic valve:    Structurally normal valve.   Cusp separation was normal.  Doppler:  Transvalvular velocity was within the normal range. There was no regurgitation.  ------------------------------------------------------------------- Tricuspid valve:   Structurally normal valve.   Leaflet separation was normal.  Doppler:  Transvalvular velocity was within the normal range. There was trivial regurgitation.  ------------------------------------------------------------------- Right atrium:  The atrium was normal in size.  ------------------------------------------------------------------- Systemic veins: Inferior vena cava: The vessel was normal in size. The respirophasic diameter changes were in the normal range (>= 50%), consistent with normal central venous pressure.  ------------------------------------------------------------------- Measurements   Left ventricle                            Value          Reference  LV ID, ED, PLAX chordal           (L)     39    mm       43 - 52  LV ID, ES, PLAX chordal                   27    mm       23 - 38  LV fx shortening, PLAX chordal            31    %        >=29  LV PW thickness, ED                       11    mm       ---------  IVS/LV PW ratio, ED                       0.91           <=1.3  Stroke volume, 2D                         130   ml       ---------  Stroke volume/bsa, 2D                     72    ml/m^2   ---------  LV e&', lateral  8.49  cm/s     ---------  LV E/e&', lateral                          18.73          ---------  LV e&',  medial                             6.74  cm/s     ---------  LV E/e&', medial                           23.59          ---------  LV e&', average                            7.62  cm/s     ---------  LV E/e&', average                          20.88          ---------    Ventricular septum                        Value          Reference  IVS thickness, ED                         10    mm       ---------    LVOT                                      Value          Reference  LVOT ID, S                                25    mm       ---------  LVOT area                                 4.91  cm^2     ---------  LVOT peak velocity, S                     126   cm/s     ---------  LVOT mean velocity, S                     89.7  cm/s     ---------  LVOT VTI, S                               26.4  cm       ---------  LVOT peak gradient, S                     6     mm Hg    ---------    Aorta  Value          Reference  Aortic root ID, ED                        36    mm       ---------  Ascending aorta ID, A-P, S                35    mm       ---------    Left atrium                               Value          Reference  LA ID, A-P, ES                            41    mm       ---------  LA ID/bsa, A-P                    (H)     2.26  cm/m^2   <=2.2  LA volume, S                              58.8  ml       ---------  LA volume/bsa, S                          32.5  ml/m^2   ---------  LA volume, ES, 1-p A4C                    55.8  ml       ---------  LA volume/bsa, ES, 1-p A4C                30.8  ml/m^2   ---------  LA volume, ES, 1-p A2C                    59.1  ml       ---------  LA volume/bsa, ES, 1-p A2C                32.6  ml/m^2   ---------    Mitral valve                              Value          Reference  Mitral E-wave peak velocity               159   cm/s     ---------  Mitral A-wave peak velocity               151   cm/s     ---------  Mitral  mean velocity, D                   111   cm/s     ---------  Mitral deceleration time                  162   ms       150 - 230  Mitral pressure half-time                 75  ms       ---------  Mitral mean gradient, D                   6     mm Hg    ---------  Mitral peak gradient, D                   10    mm Hg    ---------  Mitral E/A ratio, peak                    1.1            ---------  Mitral valve area, PHT, DP                3.61  cm^2     ---------  Mitral valve area/bsa, PHT, DP            1.99  cm^2/m^2 ---------  Mitral valve area, LVOT                   3.62  cm^2     ---------  continuity  Mitral valve area/bsa, LVOT               2     cm^2/m^2 ---------  continuity  Mitral annulus VTI, D                     35.8  cm       ---------    Pulmonary arteries                        Value          Reference  PA pressure, S, DP                (H)     42    mm Hg    <=30    Tricuspid valve                           Value          Reference  Tricuspid regurg peak velocity            292   cm/s     ---------  Tricuspid peak RV-RA gradient             34    mm Hg    ---------    Systemic veins                            Value          Reference  Estimated CVP                             8     mm Hg    ---------    Right ventricle                           Value          Reference  TAPSE                                     12.6  mm       ---------  RV pressure, S, DP                (H)     42    mm Hg    <=30  RV s&', lateral, S                         11.4  cm/s     ---------  Legend: (L)  and  (H)  mark values outside specified reference range.  ------------------------------------------------------------------- Prepared and Electronically Authenticated by  Dorris Carnes, M.D. 2019-05-04T14:04:56   Impression:  Patient is doing well more than 3 months status post minimally invasive mitral valve repair and Maze procedure.  He is maintaining sinus rhythm and appears  euvolemic on exam.  His exercise tolerance continues to improve and the patient states that he feels noticeably better than he did prior to surgery.  Follow-up echocardiogram reveals normal left ventricular systolic function with intact mitral valve repair and trivial residual mitral regurgitation.  The patient does still have intermittently poor appetite and he has lost a fair amount of weight.   Plan:  We have not recommended any changes to the patient's current medications.  It might be reasonable to check thyroid function panel.  Patient will address this with his primary care physician.  I have encouraged the patient to continue to gradually increase his physical activity without any limitations at this time.  I have encouraged the patient to consider enrolling in participating in the cardiac rehab program.  The patient has been reminded regarding the importance of dental hygiene and the lifelong need for antibiotic prophylaxis for all dental cleanings and other related invasive procedures.  The patient will continue to follow-up intermittently with Dr. [name].  He will return to our office for routine follow-up next February.  He will call and return sooner should specific problems or questions arise.   I spent in excess of 15 minutes during the conduct of this office consultation and >50% of this time involved direct face-to-face encounter with the patient for counseling and/or coordination of their care.   Valentina Gu. Roxy Manns, MD 07/06/2017 9:48 AM

## 2017-07-06 NOTE — Patient Instructions (Addendum)
Continue all previous medications without any changes at this time  You are encouraged to enroll and participate in the outpatient cardiac rehab program beginning as soon as practical.  You may resume unrestricted physical activity without any particular limitations at this time.  Endocarditis is a potentially serious infection of heart valves or inside lining of the heart.  It occurs more commonly in patients with diseased heart valves (such as patient's with aortic or mitral valve disease) and in patients who have undergone heart valve repair or replacement.  Certain surgical and dental procedures may put you at risk, such as dental cleaning, other dental procedures, or any surgery involving the respiratory, urinary, gastrointestinal tract, gallbladder or prostate gland.   To minimize your chances for develooping endocarditis, maintain good oral health and seek prompt medical attention for any infections involving the mouth, teeth, gums, skin or urinary tract.    Always notify your doctor or dentist about your underlying heart valve condition before having any invasive procedures. You will need to take antibiotics before certain procedures, including all routine dental cleanings or other dental procedures.  Your cardiologist or dentist should prescribe these antibiotics for you to be taken ahead of time.      

## 2017-07-07 ENCOUNTER — Encounter: Payer: Self-pay | Admitting: *Deleted

## 2017-07-07 NOTE — Progress Notes (Signed)
[name] [name] has been referred to [facility] for CR Phase II per his request. The facility is closer to his home. I have sent the requested documentation for his MINI MVRepair/MAZE performed on 04/02/17 by Dr. Darylene Price.

## 2017-07-22 ENCOUNTER — Ambulatory Visit: Payer: Medicare Other | Admitting: Cardiology

## 2017-08-12 ENCOUNTER — Emergency Department (INDEPENDENT_AMBULATORY_CARE_PROVIDER_SITE_OTHER): Payer: Medicare Other

## 2017-08-12 ENCOUNTER — Other Ambulatory Visit: Payer: Self-pay

## 2017-08-12 ENCOUNTER — Emergency Department
Admission: EM | Admit: 2017-08-12 | Discharge: 2017-08-12 | Disposition: A | Discharge status: Home / Self Care | Payer: Medicare Other | Source: Home / Self Care | Attending: Family Medicine | Admitting: Family Medicine

## 2017-08-12 ENCOUNTER — Other Ambulatory Visit: Payer: Self-pay | Admitting: Family Medicine

## 2017-08-12 ENCOUNTER — Encounter: Payer: Self-pay | Admitting: Emergency Medicine

## 2017-08-12 DIAGNOSIS — R42 Dizziness and giddiness: Secondary | ICD-10-CM | POA: Diagnosis not present

## 2017-08-12 DIAGNOSIS — M25511 Pain in right shoulder: Secondary | ICD-10-CM | POA: Diagnosis not present

## 2017-08-12 DIAGNOSIS — M7521 Bicipital tendinitis, right shoulder: Secondary | ICD-10-CM | POA: Diagnosis not present
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
## 2017-08-12 NOTE — Discharge Instructions (Signed)
Begin shoulder stretching and range of motion exercises as tolerated.  Put ice on the injured area: Put ice in a plastic bag. Place a towel between your skin and the bag. Leave the ice on for 20 minutes, 2-3 times a day.

## 2017-08-12 NOTE — ED Triage Notes (Signed)
77 y.o male presents with wife. States he is currently in rehab from having open heart sx on Feb 28th. States for the last week he's been dizziness with nausea. States he went for PT today where they checked his BP and it was elevated and he was told to go to UC. He also complains of right shoulder pain, states 3-4 years ago he had an injury to this arm while trimming bushes with no medical treatment. The right arm is also where they inserted to perform his sx. He states this arm has been painful since and while during PT. He states he feels clicking and rubbing. He has been using ice with no relief. Wife is concerned about appearance, stating his "color is off" and he tires easily.

## 2017-08-13 ENCOUNTER — Emergency Department (HOSPITAL_COMMUNITY): Payer: Medicare Other

## 2017-08-13 ENCOUNTER — Telehealth: Payer: Self-pay

## 2017-08-13 ENCOUNTER — Inpatient Hospital Stay (HOSPITAL_COMMUNITY)
Admission: EM | Admit: 2017-08-13 | Discharge: 2017-08-16 | DRG: 378 | Disposition: A | Discharge status: Home / Self Care | Payer: Medicare Other | Attending: Internal Medicine | Admitting: Internal Medicine

## 2017-08-13 ENCOUNTER — Other Ambulatory Visit: Payer: Self-pay

## 2017-08-13 ENCOUNTER — Encounter (HOSPITAL_COMMUNITY): Payer: Self-pay | Admitting: Emergency Medicine

## 2017-08-13 DIAGNOSIS — N179 Acute kidney failure, unspecified: Secondary | ICD-10-CM | POA: Diagnosis present

## 2017-08-13 DIAGNOSIS — R634 Abnormal weight loss: Secondary | ICD-10-CM | POA: Diagnosis present

## 2017-08-13 DIAGNOSIS — Z91013 Allergy to seafood: Secondary | ICD-10-CM

## 2017-08-13 DIAGNOSIS — D123 Benign neoplasm of transverse colon: Secondary | ICD-10-CM | POA: Diagnosis present

## 2017-08-13 DIAGNOSIS — D62 Acute posthemorrhagic anemia: Secondary | ICD-10-CM | POA: Diagnosis present

## 2017-08-13 DIAGNOSIS — R231 Pallor: Secondary | ICD-10-CM | POA: Diagnosis present

## 2017-08-13 DIAGNOSIS — K5520 Angiodysplasia of colon without hemorrhage: Secondary | ICD-10-CM

## 2017-08-13 DIAGNOSIS — Z9889 Other specified postprocedural states: Secondary | ICD-10-CM

## 2017-08-13 DIAGNOSIS — K922 Gastrointestinal hemorrhage, unspecified: Secondary | ICD-10-CM | POA: Diagnosis present

## 2017-08-13 DIAGNOSIS — N1832 Chronic kidney disease, stage 3b: Secondary | ICD-10-CM | POA: Diagnosis present

## 2017-08-13 DIAGNOSIS — E1122 Type 2 diabetes mellitus with diabetic chronic kidney disease: Secondary | ICD-10-CM | POA: Diagnosis present

## 2017-08-13 DIAGNOSIS — K5730 Diverticulosis of large intestine without perforation or abscess without bleeding: Secondary | ICD-10-CM | POA: Diagnosis present

## 2017-08-13 DIAGNOSIS — R188 Other ascites: Secondary | ICD-10-CM

## 2017-08-13 DIAGNOSIS — K5521 Angiodysplasia of colon with hemorrhage: Secondary | ICD-10-CM | POA: Diagnosis present

## 2017-08-13 DIAGNOSIS — Z79890 Hormone replacement therapy (postmenopausal): Secondary | ICD-10-CM

## 2017-08-13 DIAGNOSIS — Z888 Allergy status to other drugs, medicaments and biological substances status: Secondary | ICD-10-CM | POA: Diagnosis not present

## 2017-08-13 DIAGNOSIS — E875 Hyperkalemia: Secondary | ICD-10-CM | POA: Diagnosis not present

## 2017-08-13 DIAGNOSIS — Z87891 Personal history of nicotine dependence: Secondary | ICD-10-CM | POA: Diagnosis not present

## 2017-08-13 DIAGNOSIS — E1121 Type 2 diabetes mellitus with diabetic nephropathy: Secondary | ICD-10-CM | POA: Diagnosis present

## 2017-08-13 DIAGNOSIS — M25511 Pain in right shoulder: Secondary | ICD-10-CM | POA: Diagnosis present

## 2017-08-13 DIAGNOSIS — D122 Benign neoplasm of ascending colon: Secondary | ICD-10-CM | POA: Diagnosis present

## 2017-08-13 DIAGNOSIS — K7460 Unspecified cirrhosis of liver: Secondary | ICD-10-CM

## 2017-08-13 DIAGNOSIS — Z79899 Other long term (current) drug therapy: Secondary | ICD-10-CM

## 2017-08-13 DIAGNOSIS — K648 Other hemorrhoids: Secondary | ICD-10-CM | POA: Diagnosis not present

## 2017-08-13 DIAGNOSIS — Z880 Allergy status to penicillin: Secondary | ICD-10-CM | POA: Diagnosis not present

## 2017-08-13 DIAGNOSIS — K3189 Other diseases of stomach and duodenum: Secondary | ICD-10-CM | POA: Diagnosis not present

## 2017-08-13 DIAGNOSIS — K921 Melena: Principal | ICD-10-CM | POA: Diagnosis present

## 2017-08-13 DIAGNOSIS — K635 Polyp of colon: Secondary | ICD-10-CM

## 2017-08-13 DIAGNOSIS — Z8673 Personal history of transient ischemic attack (TIA), and cerebral infarction without residual deficits: Secondary | ICD-10-CM

## 2017-08-13 DIAGNOSIS — I480 Paroxysmal atrial fibrillation: Secondary | ICD-10-CM | POA: Diagnosis present

## 2017-08-13 DIAGNOSIS — D649 Anemia, unspecified: Secondary | ICD-10-CM | POA: Diagnosis not present

## 2017-08-13 DIAGNOSIS — Z7982 Long term (current) use of aspirin: Secondary | ICD-10-CM | POA: Diagnosis not present

## 2017-08-13 DIAGNOSIS — K449 Diaphragmatic hernia without obstruction or gangrene: Secondary | ICD-10-CM | POA: Diagnosis present

## 2017-08-13 DIAGNOSIS — D696 Thrombocytopenia, unspecified: Secondary | ICD-10-CM | POA: Diagnosis present

## 2017-08-13 DIAGNOSIS — I130 Hypertensive heart and chronic kidney disease with heart failure and stage 1 through stage 4 chronic kidney disease, or unspecified chronic kidney disease: Secondary | ICD-10-CM | POA: Diagnosis present

## 2017-08-13 DIAGNOSIS — K219 Gastro-esophageal reflux disease without esophagitis: Secondary | ICD-10-CM | POA: Diagnosis present

## 2017-08-13 DIAGNOSIS — I5032 Chronic diastolic (congestive) heart failure: Secondary | ICD-10-CM | POA: Diagnosis present

## 2017-08-13 DIAGNOSIS — Z8679 Personal history of other diseases of the circulatory system: Secondary | ICD-10-CM

## 2017-08-13 DIAGNOSIS — N289 Disorder of kidney and ureter, unspecified: Secondary | ICD-10-CM | POA: Diagnosis present

## 2017-08-13 DIAGNOSIS — N183 Chronic kidney disease, stage 3 (moderate): Secondary | ICD-10-CM | POA: Diagnosis present

## 2017-08-13 DIAGNOSIS — Z7984 Long term (current) use of oral hypoglycemic drugs: Secondary | ICD-10-CM

## 2017-08-13 DIAGNOSIS — R69 Illness, unspecified: Secondary | ICD-10-CM

## 2017-08-13 DIAGNOSIS — Z7901 Long term (current) use of anticoagulants: Secondary | ICD-10-CM | POA: Diagnosis not present

## 2017-08-13 LAB — CBC WITH DIFFERENTIAL/PLATELET
BAND NEUTROPHILS: 0 %
BASOS ABS: 0 10*3/uL (ref 0.0–0.1)
BASOS PCT: 0.4 %
BLASTS: 0 %
Basophils Absolute: 40 cells/uL (ref 0–200)
Basophils Relative: 0 %
EOS ABS: 89 {cells}/uL (ref 15–500)
EOS ABS: 0.1 10*3/uL (ref 0.0–0.7)
Eosinophils Relative: 0.9 %
Eosinophils Relative: 1 %
HEMATOCRIT: 17.1 % — AB (ref 38.5–50.0)
HEMATOCRIT: 17.7 % — AB (ref 39.0–52.0)
HEMOGLOBIN: 5.5 g/dL — AB (ref 13.0–17.0)
Hemoglobin: 5.4 g/dL — CL (ref 13.2–17.1)
Lymphocytes Relative: 7 %
Lymphs Abs: 1079 cells/uL (ref 850–3900)
Lymphs Abs: 0.8 10*3/uL (ref 0.7–4.0)
MCH: 29.3 pg (ref 27.0–33.0)
MCH: 30.4 pg (ref 26.0–34.0)
MCHC: 31.6 g/dL — ABNORMAL LOW (ref 32.0–36.0)
MCHC: 31.1 g/dL (ref 30.0–36.0)
MCV: 92.9 fL (ref 80.0–100.0)
MCV: 97.8 fL (ref 78.0–100.0)
METAMYELOCYTES PCT: 0 %
MONOS PCT: 6 %
MPV: 9.8 fL (ref 7.5–12.5)
MYELOCYTES: 0 %
Monocytes Absolute: 0.7 10*3/uL (ref 0.1–1.0)
Monocytes Relative: 7.6 %
NEUTROS ABS: 10 10*3/uL — AB (ref 1.7–7.7)
Neutro Abs: 7940 cells/uL — ABNORMAL HIGH (ref 1500–7800)
Neutrophils Relative %: 80.2 %
Neutrophils Relative %: 86 %
Other: 0 %
PROMYELOCYTES RELATIVE: 0 %
Platelets: 358 10*3/uL (ref 140–400)
Platelets: 415 10*3/uL — ABNORMAL HIGH (ref 150–400)
RBC: 1.84 10*6/uL — ABNORMAL LOW (ref 4.20–5.80)
RBC: 1.81 MIL/uL — AB (ref 4.22–5.81)
RDW: 13.4 % (ref 11.0–15.0)
RDW: 15 % (ref 11.5–15.5)
TOTAL LYMPHOCYTE: 10.9 %
WBC: 9.9 10*3/uL (ref 3.8–10.8)
WBC: 11.6 10*3/uL — AB (ref 4.0–10.5)
WBCMIX: 752 {cells}/uL (ref 200–950)
nRBC: 0 /100 WBC

## 2017-08-13 LAB — PREPARE RBC (CROSSMATCH)

## 2017-08-13 LAB — COMPREHENSIVE METABOLIC PANEL
ALT: 18 U/L (ref 0–44)
AST: 28 U/L (ref 15–41)
Albumin: 3.5 g/dL (ref 3.5–5.0)
Alkaline Phosphatase: 105 U/L (ref 38–126)
Anion gap: 11 (ref 5–15)
BILIRUBIN TOTAL: 0.3 mg/dL (ref 0.3–1.2)
BUN: 36 mg/dL — ABNORMAL HIGH (ref 8–23)
CHLORIDE: 102 mmol/L (ref 98–111)
CO2: 22 mmol/L (ref 22–32)
CREATININE: 1.7 mg/dL — AB (ref 0.61–1.24)
Calcium: 9.4 mg/dL (ref 8.9–10.3)
GFR calc Af Amer: 43 mL/min — ABNORMAL LOW (ref >60)
GFR, EST NON AFRICAN AMERICAN: 37 mL/min — AB (ref >60)
Glucose, Bld: 160 mg/dL — ABNORMAL HIGH (ref 70–99)
POTASSIUM: 5.4 mmol/L — AB (ref 3.5–5.1)
Sodium: 135 mmol/L (ref 135–145)
TOTAL PROTEIN: 6.3 g/dL — AB (ref 6.5–8.1)

## 2017-08-13 LAB — POC OCCULT BLOOD, ED: Fecal Occult Bld: POSITIVE — AB

## 2017-08-13 LAB — I-STAT CG4 LACTIC ACID, ED
Lactic Acid, Venous: 2.56 mmol/L (ref 0.5–1.9)
Lactic Acid, Venous: 0.72 mmol/L (ref 0.5–1.9)

## 2017-08-13 LAB — GLUCOSE, CAPILLARY
Glucose-Capillary: 104 mg/dL — ABNORMAL HIGH (ref 70–99)
Glucose-Capillary: 104 mg/dL — ABNORMAL HIGH (ref 70–99)

## 2017-08-13 LAB — PROTIME-INR
INR: 1.24
Prothrombin Time: 15.5 seconds — ABNORMAL HIGH (ref 11.4–15.2)

## 2017-08-13 LAB — LIPASE, BLOOD: LIPASE: 50 U/L (ref 11–51)

## 2017-08-13 LAB — I-STAT TROPONIN, ED: TROPONIN I, POC: 0.01 ng/mL (ref 0.00–0.08)

## 2017-08-13 MED ORDER — SERTRALINE HCL 100 MG PO TABS
100.0000 mg | ORAL_TABLET | Freq: Every day | ORAL | Status: DC
  Filled 2017-08-13: qty 1

## 2017-08-13 MED ORDER — ACETAMINOPHEN 325 MG PO TABS
650.0000 mg | ORAL_TABLET | Freq: Four times a day (QID) | ORAL | Status: DC | PRN
  Administered 2017-08-13: 650 mg via ORAL
  Filled 2017-08-13: qty 2

## 2017-08-13 MED ORDER — SODIUM CHLORIDE 0.9 % IV SOLN: 10.0000 mL/h | Freq: Once | INTRAVENOUS | Status: DC

## 2017-08-13 MED ORDER — ONDANSETRON HCL 4 MG PO TABS
4.0000 mg | ORAL_TABLET | Freq: Four times a day (QID) | ORAL | Status: DC | PRN
  Administered 2017-08-13: 4 mg via ORAL
  Filled 2017-08-13: qty 1

## 2017-08-13 MED ORDER — FUROSEMIDE 10 MG/ML IJ SOLN: 20.0000 mg | Freq: Once | INTRAMUSCULAR | Status: DC

## 2017-08-13 MED ORDER — SODIUM CHLORIDE 0.9 % IV SOLN
80.0000 mg | Freq: Once | INTRAVENOUS | Status: AC
  Administered 2017-08-13: 80 mg via INTRAVENOUS
  Filled 2017-08-13 (×2): qty 80

## 2017-08-13 MED ORDER — ALPRAZOLAM 0.25 MG PO TABS
0.2500 mg | ORAL_TABLET | Freq: Two times a day (BID) | ORAL | Status: DC | PRN
  Administered 2017-08-13: 0.25 mg via ORAL
  Filled 2017-08-13: qty 1

## 2017-08-13 MED ORDER — ONDANSETRON HCL 4 MG/2ML IJ SOLN: 4.0000 mg | Freq: Four times a day (QID) | INTRAMUSCULAR | Status: DC | PRN

## 2017-08-13 MED ORDER — ACETAMINOPHEN 650 MG RE SUPP: 650.0000 mg | Freq: Four times a day (QID) | RECTAL | Status: DC | PRN

## 2017-08-13 MED ORDER — SODIUM CHLORIDE 0.9 % IV SOLN
8.0000 mg/h | INTRAVENOUS | Status: DC
  Administered 2017-08-13 – 2017-08-14 (×2): 8 mg/h via INTRAVENOUS
  Filled 2017-08-13 (×4): qty 80

## 2017-08-13 MED ORDER — INSULIN ASPART 100 UNIT/ML ~~LOC~~ SOLN
0.0000 [IU] | SUBCUTANEOUS | Status: DC
  Administered 2017-08-14 – 2017-08-15 (×2): 1 [IU] via SUBCUTANEOUS
  Administered 2017-08-15: 2 [IU] via SUBCUTANEOUS

## 2017-08-13 MED ORDER — PANTOPRAZOLE SODIUM 40 MG IV SOLR: 40.0000 mg | Freq: Two times a day (BID) | INTRAVENOUS | Status: DC

## 2017-08-13 MED ORDER — SODIUM CHLORIDE 0.9% FLUSH
3.0000 mL | Freq: Two times a day (BID) | INTRAVENOUS | Status: DC
  Administered 2017-08-13 – 2017-08-15 (×3): 3 mL via INTRAVENOUS

## 2017-08-13 NOTE — ED Triage Notes (Signed)
Pt to ED via [facility] from home, with c/o rectal bleeding x 2 weeks, 3 x today-- with vomiting today-- pt is jaundiced- wife states that he has been this color for "days"-- went to urgent care yesterday for shoulder, was told that he had a torn rotator cuff after xray-- has been taking tylenol for pain-  Had OHS 04/02/17
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
## 2017-08-13 NOTE — Telephone Encounter (Signed)
Wife called back, and gave results of labs and to go to hospital for possible transfusion.

## 2017-08-13 NOTE — ED Notes (Signed)
Patient transported to X-ray 

## 2017-08-13 NOTE — Telephone Encounter (Signed)
Left message for patient to call.  Dr Assunta Found would like patient to go to the hospital as his Hemoglobin is low, and may possibly need a transfusion.  Left call back information.
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
## 2017-08-14 ENCOUNTER — Inpatient Hospital Stay (HOSPITAL_COMMUNITY): Payer: Medicare Other

## 2017-08-14 ENCOUNTER — Encounter: Payer: Medicare Other | Admitting: Family Medicine

## 2017-08-14 DIAGNOSIS — K921 Melena: Principal | ICD-10-CM

## 2017-08-14 DIAGNOSIS — D649 Anemia, unspecified: Secondary | ICD-10-CM

## 2017-08-14 DIAGNOSIS — K922 Gastrointestinal hemorrhage, unspecified: Secondary | ICD-10-CM

## 2017-08-14 LAB — CBC
HCT: 30 % — ABNORMAL LOW (ref 39.0–52.0)
HEMATOCRIT: 25.4 % — AB (ref 39.0–52.0)
HEMOGLOBIN: 9.7 g/dL — AB (ref 13.0–17.0)
Hemoglobin: 8.5 g/dL — ABNORMAL LOW (ref 13.0–17.0)
MCH: 30.9 pg (ref 26.0–34.0)
MCH: 30.6 pg (ref 26.0–34.0)
MCHC: 33.5 g/dL (ref 30.0–36.0)
MCHC: 32.3 g/dL (ref 30.0–36.0)
MCV: 92.4 fL (ref 78.0–100.0)
MCV: 94.6 fL (ref 78.0–100.0)
Platelets: 288 10*3/uL (ref 150–400)
Platelets: 360 10*3/uL (ref 150–400)
RBC: 2.75 MIL/uL — AB (ref 4.22–5.81)
RBC: 3.17 MIL/uL — AB (ref 4.22–5.81)
RDW: 15.3 % (ref 11.5–15.5)
RDW: 15.7 % — ABNORMAL HIGH (ref 11.5–15.5)
WBC: 8.3 10*3/uL (ref 4.0–10.5)
WBC: 11.1 10*3/uL — AB (ref 4.0–10.5)

## 2017-08-14 LAB — COMPREHENSIVE METABOLIC PANEL
ALT: 16 U/L (ref 0–44)
ANION GAP: 11 (ref 5–15)
AST: 21 U/L (ref 15–41)
Albumin: 3 g/dL — ABNORMAL LOW (ref 3.5–5.0)
Alkaline Phosphatase: 78 U/L (ref 38–126)
BUN: 34 mg/dL — ABNORMAL HIGH (ref 8–23)
CHLORIDE: 102 mmol/L (ref 98–111)
CO2: 23 mmol/L (ref 22–32)
Calcium: 8.7 mg/dL — ABNORMAL LOW (ref 8.9–10.3)
Creatinine, Ser: 1.79 mg/dL — ABNORMAL HIGH (ref 0.61–1.24)
GFR, EST AFRICAN AMERICAN: 40 mL/min — AB (ref >60)
GFR, EST NON AFRICAN AMERICAN: 35 mL/min — AB (ref >60)
Glucose, Bld: 98 mg/dL (ref 70–99)
POTASSIUM: 4.5 mmol/L (ref 3.5–5.1)
Sodium: 136 mmol/L (ref 135–145)
TOTAL PROTEIN: 5.3 g/dL — AB (ref 6.5–8.1)
Total Bilirubin: 0.7 mg/dL (ref 0.3–1.2)

## 2017-08-14 LAB — URINALYSIS, ROUTINE W REFLEX MICROSCOPIC
Bilirubin Urine: NEGATIVE
Glucose, UA: NEGATIVE mg/dL
Hgb urine dipstick: NEGATIVE
Ketones, ur: NEGATIVE mg/dL
LEUKOCYTES UA: NEGATIVE
NITRITE: NEGATIVE
PROTEIN: NEGATIVE mg/dL
Specific Gravity, Urine: 1.012 (ref 1.005–1.030)
pH: 5 (ref 5.0–8.0)

## 2017-08-14 LAB — GLUCOSE, CAPILLARY
GLUCOSE-CAPILLARY: 99 mg/dL (ref 70–99)
Glucose-Capillary: 97 mg/dL (ref 70–99)
Glucose-Capillary: 103 mg/dL — ABNORMAL HIGH (ref 70–99)
Glucose-Capillary: 123 mg/dL — ABNORMAL HIGH (ref 70–99)
Glucose-Capillary: 131 mg/dL — ABNORMAL HIGH (ref 70–99)

## 2017-08-14 LAB — MRSA PCR SCREENING: MRSA by PCR: NEGATIVE

## 2017-08-14 MED ORDER — PEG-KCL-NACL-NASULF-NA ASC-C 100 G PO SOLR: 1.0000 | Freq: Once | ORAL | Status: DC

## 2017-08-14 MED ORDER — PEG-KCL-NACL-NASULF-NA ASC-C 100 G PO SOLR
0.5000 | Freq: Once | ORAL | Status: AC
  Administered 2017-08-14: 100 g via ORAL
  Filled 2017-08-14 (×2): qty 1

## 2017-08-14 MED ORDER — METOCLOPRAMIDE HCL 5 MG/ML IJ SOLN
10.0000 mg | Freq: Once | INTRAMUSCULAR | Status: AC
Start: 2017-08-14 — End: 2017-08-14
  Administered 2017-08-14: 10 mg via INTRAVENOUS
  Filled 2017-08-14: qty 2

## 2017-08-14 MED ORDER — BISACODYL 5 MG PO TBEC
10.0000 mg | DELAYED_RELEASE_TABLET | Freq: Four times a day (QID) | ORAL | Status: AC
  Administered 2017-08-14 (×2): 10 mg via ORAL
  Filled 2017-08-14 (×2): qty 2

## 2017-08-14 MED ORDER — PEG-KCL-NACL-NASULF-NA ASC-C 100 G PO SOLR
0.5000 | Freq: Once | ORAL | Status: AC
  Administered 2017-08-14: 100 g via ORAL
  Filled 2017-08-14: qty 1

## 2017-08-14 MED ORDER — PANTOPRAZOLE SODIUM 40 MG IV SOLR
40.0000 mg | Freq: Two times a day (BID) | INTRAVENOUS | Status: DC
Start: 2017-08-14 — End: 2017-08-15
  Administered 2017-08-14: 40 mg via INTRAVENOUS
  Filled 2017-08-14 (×2): qty 40

## 2017-08-14 MED ORDER — MORPHINE SULFATE (PF) 2 MG/ML IV SOLN
2.0000 mg | Freq: Once | INTRAVENOUS | Status: AC
  Administered 2017-08-14: 2 mg via INTRAVENOUS
  Filled 2017-08-14: qty 1

## 2017-08-14 MED ORDER — METOCLOPRAMIDE HCL 5 MG/ML IJ SOLN
10.0000 mg | Freq: Once | INTRAMUSCULAR | Status: AC
  Administered 2017-08-14: 10 mg via INTRAVENOUS
  Filled 2017-08-14: qty 2

## 2017-08-14 MED ORDER — FUROSEMIDE 10 MG/ML IJ SOLN: 20.0000 mg | Freq: Once | INTRAMUSCULAR | Status: DC

## 2017-08-14 NOTE — Progress Notes (Signed)
Pt states his last 2 BM were not tarry and no blood noted.

## 2017-08-14 NOTE — Progress Notes (Signed)
PROGRESS NOTE  [name] [name] HGD:[id number] DOB: 1940/05/03 DOA: 08/13/2017 PCP: Gregor Hams, MD  HPI/Recap of past 24 hours:  Feeling better after prbc transfusion He Denies pain, no fever, no n/v (he reports vomited at home when he felt dizzy, denies hematemesis) Wife at bedside   Assessment/Plan: Principal Problem:   Upper GI bleed Active Problems:   GERD (gastroesophageal reflux disease)   CKD (chronic kidney disease), stage III (HCC)   S/P minimally invasive mitral valve repair    S/P minimally invasive maze operation for atrial fibrillation   Acute blood loss anemia   ARF (acute renal failure) (HCC)   Hyperkalemia  Symptomatic anemia - heme positive stool, INR 1.24, platelet 288, total bilirubin 0.7 -hgb 5.5 on presentation , status post 3 units PRBC transfusion, hemoglobin on repeat at 8.5 -Suspect upper GI bleed in the setting of anticoagulation,  apixaban held, he also reports alcohol use, he is on IV PPI -GI consulted, scope tomorrow  AKI on CKD3 BUN 36 creatinine 1.79 in the setting of suspected upper GI bleed -Baseline creatinine 1.3 in April 2019 -ua pending   Atrial fibrillation-status post Maze procedure.  Patient remains in sinus rhythm without nodal agent Anticoagulation on hold  PFO-status post closure.  status post mitral valve repair in 04/2017-continue SBE prophylaxis   chronic diastolic congestive heart failure-patient is euvolemic -cxr on presentation: "Small left pleural effusion similar to the prior study from 04/2017. Negative for heart failure or pneumonia." -no edema -Lasix held  Non-insulin-dependent type 2 diabetes Home meds Metformin held On SSI here  Hypertension -Irbesartan held in the setting of GI bleed and mild hyperkalemia on presentation  Hyperkalemia  potassium 5.4 on presentation Hold ACE inhibitor, repeat 4.5  Right shoulder pain: Patient likely has underlying arthropathy probably aggravated by recent  exercise routine.  X-ray done yesterday did not show any acute findings.  Patient is unable to abduct his arm more than about 100 degrees.  He will need outpatient work-up for same   Stable after prbc transfusion, will change from stepdown level to med tele.  Code Status: full  Family Communication: patient and wife at bedside  Disposition Plan: home in 1-2 days with gi clearance   Consultants:  LBGI  Procedures:  egd/colonoscopy in am  Antibiotics:  none   Objective: BP 138/75   Pulse 95   Temp 98.1 F (36.7 C) (Oral)   Resp 15   Ht '5\' 5"'$  (1.651 m)   Wt 71 kg (156 lb 8.4 oz)   SpO2 99%   BMI 26.05 kg/m   Intake/Output Summary (Last 24 hours) at 08/14/2017 1200 Last data filed at 08/14/2017 1130 Gross per 24 hour  Intake 1167 ml  Output 400 ml  Net 767 ml   Filed Weights   08/13/17 1411 08/13/17 1700  Weight: 68 kg (150 lb) 71 kg (156 lb 8.4 oz)    Exam: Patient is examined daily including today on 08/14/2017, exams remain the same as of yesterday except that has changed    General:  NAD  Cardiovascular: RRR  Respiratory: CTABL  Abdomen: Soft/ND/NT, positive BS  Musculoskeletal: No Edema, right shoulder limited range of motion due to pain (not able to lift above shoulder)  Neuro: alert, oriented   Data Reviewed: Basic Metabolic Panel: Recent Labs  Lab 08/13/17 1447 08/14/17 0606  NA 135 136  K 5.4* 4.5  CL 102 102  CO2 22 23  GLUCOSE 160* 98  BUN 36* 34*  CREATININE 1.70*  1.79*  CALCIUM 9.4 8.7*   Liver Function Tests: Recent Labs  Lab 08/13/17 1447 08/14/17 0606  AST 28 21  ALT 18 16  ALKPHOS 105 78  BILITOT 0.3 0.7  PROT 6.3* 5.3*  ALBUMIN 3.5 3.0*   Recent Labs  Lab 08/13/17 1447  LIPASE 50   No results for input(s): AMMONIA in the last 168 hours. CBC: Recent Labs  Lab 08/13/17 1447 08/14/17 0606  WBC 11.6* 8.3  NEUTROABS 10.0*  --   HGB 5.5* 8.5*  HCT 17.7* 25.4*  MCV 97.8 92.4  PLT 415* 288   Cardiac  Enzymes:   No results for input(s): CKTOTAL, CKMB, CKMBINDEX, TROPONINI in the last 168 hours. BNP (last 3 results) No results for input(s): BNP in the last 8760 hours.  ProBNP (last 3 results) Recent Labs    10/24/16 1010  PROBNP 1,313.0*    CBG: Recent Labs  Lab 08/13/17 2049 08/13/17 2351 08/14/17 0404 08/14/17 0751 08/14/17 1153  GLUCAP 104* 104* 97 99 103*    Recent Results (from the past 240 hour(s))  MRSA PCR Screening     Status: None   Collection Time: 08/14/17  3:31 AM  Result Value Ref Range Status   MRSA by PCR NEGATIVE NEGATIVE Final    Comment:        The GeneXpert MRSA Assay (FDA approved for NASAL specimens only), is one component of a comprehensive MRSA colonization surveillance program. It is not intended to diagnose MRSA infection nor to guide or monitor treatment for MRSA infections. Performed at [facility], [place] [address] [place], [place] [address]      Studies: Dg Chest 2 View  Result Date: 08/13/2017 CLINICAL DATA:  Dyspnea EXAM: CHEST - 2 VIEW COMPARISON:  04/27/2017 FINDINGS: Mitral valve replacement. Left atrial appendage clip unchanged. Heart size within normal limits. Negative for edema. Small left effusion unchanged. No significant right effusion. Negative for pneumonia. Chronic fracture left scapula unchanged. IMPRESSION: Small left pleural effusion similar to the prior study. Negative for heart failure or pneumonia. Electronically Signed   By: Franchot Gallo M.D.   On: 08/13/2017 15:22    Scheduled Meds: . bisacodyl  10 mg Oral Q6H  . furosemide  20 mg Intravenous Once  . insulin aspart  0-9 Units Subcutaneous Q4H  . metoCLOPramide (REGLAN) injection  10 mg Intravenous Once   Followed by  . metoCLOPramide (REGLAN) injection  10 mg Intravenous Once  . pantoprazole  40 mg Intravenous Q12H  . peg 3350 powder  0.5 kit Oral Once   And  . peg 3350 powder  0.5 kit Oral Once  . sertraline  100 mg Oral Daily  . sodium chloride  flush  3 mL Intravenous Q12H    Continuous Infusions: . sodium chloride       Time spent: 43mns I have personally reviewed and interpreted on  08/14/2017 daily labs, tele strips, imagings as discussed above under date review session and assessment and plans.  I reviewed all nursing notes, pharmacy notes, consultant notes,  vitals, pertinent old records  I have discussed plan of care as described above with RN , patient and family on 08/14/2017   FFlorencia ReasonsMD, PhD  [facility] Pager [phone] If 7PM-7AM, please contact night-coverage at [email or web address], password [email or web address]7/01/2018, 12:00 PM  LOS: 1 day
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
## 2017-08-15 ENCOUNTER — Inpatient Hospital Stay (HOSPITAL_COMMUNITY): Payer: Medicare Other | Admitting: Anesthesiology

## 2017-08-15 ENCOUNTER — Encounter (HOSPITAL_COMMUNITY)
Admission: EM | Disposition: A | Discharge status: Home / Self Care | Payer: Self-pay | Source: Home / Self Care | Attending: Internal Medicine

## 2017-08-15 ENCOUNTER — Encounter (HOSPITAL_COMMUNITY): Payer: Self-pay | Admitting: Gastroenterology

## 2017-08-15 DIAGNOSIS — D122 Benign neoplasm of ascending colon: Secondary | ICD-10-CM

## 2017-08-15 DIAGNOSIS — K5730 Diverticulosis of large intestine without perforation or abscess without bleeding: Secondary | ICD-10-CM

## 2017-08-15 DIAGNOSIS — K3189 Other diseases of stomach and duodenum: Secondary | ICD-10-CM

## 2017-08-15 DIAGNOSIS — K5520 Angiodysplasia of colon without hemorrhage: Secondary | ICD-10-CM

## 2017-08-15 DIAGNOSIS — K635 Polyp of colon: Secondary | ICD-10-CM

## 2017-08-15 DIAGNOSIS — D123 Benign neoplasm of transverse colon: Secondary | ICD-10-CM

## 2017-08-15 DIAGNOSIS — K5521 Angiodysplasia of colon with hemorrhage: Secondary | ICD-10-CM

## 2017-08-15 DIAGNOSIS — K921 Melena: Secondary | ICD-10-CM

## 2017-08-15 DIAGNOSIS — K648 Other hemorrhoids: Secondary | ICD-10-CM

## 2017-08-15 DIAGNOSIS — D649 Anemia, unspecified: Secondary | ICD-10-CM

## 2017-08-15 DIAGNOSIS — K449 Diaphragmatic hernia without obstruction or gangrene: Secondary | ICD-10-CM

## 2017-08-15 HISTORY — PX: COLONOSCOPY WITH PROPOFOL: SHX5780

## 2017-08-15 HISTORY — PX: POLYPECTOMY: SHX5525

## 2017-08-15 HISTORY — PX: HOT HEMOSTASIS: SHX5433

## 2017-08-15 HISTORY — PX: ESOPHAGOGASTRODUODENOSCOPY (EGD) WITH PROPOFOL: SHX5813

## 2017-08-15 LAB — TYPE AND SCREEN
ABO/RH(D): B POS
Antibody Screen: NEGATIVE
UNIT DIVISION: 0
Unit division: 0
Unit division: 0

## 2017-08-15 LAB — CBC
HCT: 29.5 % — ABNORMAL LOW (ref 39.0–52.0)
HEMATOCRIT: 27.3 % — AB (ref 39.0–52.0)
Hemoglobin: 8.8 g/dL — ABNORMAL LOW (ref 13.0–17.0)
Hemoglobin: 9.5 g/dL — ABNORMAL LOW (ref 13.0–17.0)
MCH: 30.3 pg (ref 26.0–34.0)
MCH: 30.3 pg (ref 26.0–34.0)
MCHC: 32.2 g/dL (ref 30.0–36.0)
MCHC: 32.2 g/dL (ref 30.0–36.0)
MCV: 94.1 fL (ref 78.0–100.0)
MCV: 93.9 fL (ref 78.0–100.0)
PLATELETS: 310 10*3/uL (ref 150–400)
PLATELETS: 338 10*3/uL (ref 150–400)
RBC: 2.9 MIL/uL — ABNORMAL LOW (ref 4.22–5.81)
RBC: 3.14 MIL/uL — AB (ref 4.22–5.81)
RDW: 15.4 % (ref 11.5–15.5)
RDW: 15.5 % (ref 11.5–15.5)
WBC: 8.6 10*3/uL (ref 4.0–10.5)
WBC: 11.6 10*3/uL — AB (ref 4.0–10.5)

## 2017-08-15 LAB — GLUCOSE, CAPILLARY
GLUCOSE-CAPILLARY: 112 mg/dL — AB (ref 70–99)
GLUCOSE-CAPILLARY: 99 mg/dL (ref 70–99)
GLUCOSE-CAPILLARY: 107 mg/dL — AB (ref 70–99)
GLUCOSE-CAPILLARY: 106 mg/dL — AB (ref 70–99)
Glucose-Capillary: 126 mg/dL — ABNORMAL HIGH (ref 70–99)
Glucose-Capillary: 156 mg/dL — ABNORMAL HIGH (ref 70–99)

## 2017-08-15 LAB — BPAM RBC
BLOOD PRODUCT EXPIRATION DATE: 201908052359
Blood Product Expiration Date: 201908042359
Blood Product Expiration Date: 201908052359
ISSUE DATE / TIME: 201907111743
ISSUE DATE / TIME: 201907120031
ISSUE DATE / TIME: 201907112102
UNIT TYPE AND RH: 7300
Unit Type and Rh: 7300
Unit Type and Rh: 7300

## 2017-08-15 LAB — BASIC METABOLIC PANEL
Anion gap: 9 (ref 5–15)
BUN: 26 mg/dL — ABNORMAL HIGH (ref 8–23)
CALCIUM: 9 mg/dL (ref 8.9–10.3)
CO2: 23 mmol/L (ref 22–32)
CREATININE: 1.7 mg/dL — AB (ref 0.61–1.24)
Chloride: 106 mmol/L (ref 98–111)
GFR calc Af Amer: 43 mL/min — ABNORMAL LOW (ref >60)
GFR, EST NON AFRICAN AMERICAN: 37 mL/min — AB (ref >60)
GLUCOSE: 106 mg/dL — AB (ref 70–99)
Potassium: 4.1 mmol/L (ref 3.5–5.1)
Sodium: 138 mmol/L (ref 135–145)

## 2017-08-15 SURGERY — ESOPHAGOGASTRODUODENOSCOPY (EGD) WITH PROPOFOL
Anesthesia: Monitor Anesthesia Care

## 2017-08-15 SURGERY — COLONOSCOPY WITH PROPOFOL
Anesthesia: Monitor Anesthesia Care

## 2017-08-15 MED ORDER — LACTATED RINGERS IV SOLN
INTRAVENOUS | Status: DC | PRN
  Administered 2017-08-15: 09:00:00 via INTRAVENOUS

## 2017-08-15 MED ORDER — PROPOFOL 500 MG/50ML IV EMUL
INTRAVENOUS | Status: DC | PRN
  Administered 2017-08-15: 100 ug/kg/min via INTRAVENOUS

## 2017-08-15 MED ORDER — PROPOFOL 10 MG/ML IV BOLUS
INTRAVENOUS | Status: DC | PRN
  Administered 2017-08-15 (×4): 20 mg via INTRAVENOUS

## 2017-08-15 MED ORDER — PANTOPRAZOLE SODIUM 40 MG PO TBEC
40.0000 mg | DELAYED_RELEASE_TABLET | Freq: Two times a day (BID) | ORAL | Status: DC
  Administered 2017-08-15 – 2017-08-16 (×2): 40 mg via ORAL
  Filled 2017-08-15 (×2): qty 1

## 2017-08-15 MED ORDER — ESMOLOL HCL 100 MG/10ML IV SOLN
INTRAVENOUS | Status: DC | PRN
  Administered 2017-08-15 (×2): 20 mg via INTRAVENOUS

## 2017-08-15 SURGICAL SUPPLY — 25 items

## 2017-08-15 NOTE — Progress Notes (Signed)
Tolerated lunch after procedures well. Sitting up in bed.

## 2017-08-15 NOTE — Op Note (Signed)
[facility] Patient Name: [name] [name] Procedure Date : 08/15/2017 MRN: [id number] Attending MD: Mauri Pole , MD Date of Birth: April 20, 1940 CSN: [id number] Age: 77 Admit Type: Inpatient Procedure:                Colonoscopy Indications:              Evaluation of unexplained GI bleeding Providers:                Mauri Pole, MD, Elna Breslow, RN, Laurena Spies, Technician, Clearnce Sorrel, CRNA Referring MD:              Medicines:                Monitored Anesthesia Care Complications:            No immediate complications. Estimated blood loss:                            Minimal. Estimated Blood Loss:      Procedure:                Pre-Anesthesia Assessment:                           - Prior to the procedure, a History and Physical                            was performed, and patient medications and                            allergies were reviewed. The patient's tolerance of                            previous anesthesia was also reviewed. The risks                            and benefits of the procedure and the sedation                            options and risks were discussed with the patient.                            All questions were answered, and informed consent                            was obtained. Prior Anticoagulants: The patient                            last took Xarelto (rivaroxaban) 2 days prior to the                            procedure. ASA Grade Assessment: III - A patient  with severe systemic disease. After reviewing the                            risks and benefits, the patient was deemed in                            satisfactory condition to undergo the procedure.                           After obtaining informed consent, the colonoscope                            was passed under direct vision. Throughout the                            procedure, the patient's  blood pressure, pulse, and                            oxygen saturations were monitored continuously. The                            EC-[id number] ([id number]) scope was introduced through                            the anus and advanced to the the terminal ileum,                            with identification of the appendiceal orifice and                            IC valve. The colonoscopy was somewhat difficult                            due to multiple diverticula in the colon.                            Successful completion of the procedure was aided by                            lavage. The quality of the bowel preparation was                            adequate. The terminal ileum, ileocecal valve,                            appendiceal orifice, and rectum were photographed. Scope In: 9:05:58 AM Scope Out: 9:58:18 AM Scope Withdrawal Time: 0 hours 41 minutes 51 seconds  Total Procedure Duration: 0 hours 52 minutes 20 seconds  Findings:      The perianal and digital rectal examinations were normal.      Red blood , hematin and clot was found in the entire colon.      A single large localized angioectasia with bleeding was found in the       proximal transverse colon.  Coagulation for hemostasis using argon plasma       was successful.      Two semi-pedunculated polyps were found in the transverse colon and       ascending colon. The polyps were 9 to 18 mm in size. These polyps were       removed with a hot snare. Resection and retrieval were complete.      A 2 mm polyp was found in the transverse colon. The polyp was sessile.       The polyp was removed with a cold biopsy forceps. Resection and       retrieval were complete.      Multiple small and large-mouthed diverticula were found in the sigmoid       colon and descending colon.      Non-bleeding internal hemorrhoids were found during retroflexion. The       hemorrhoids were small. Impression:               - Blood in the entire  examined colon.                           - A single bleeding colonic angioectasia. Treated                            with argon plasma coagulation (APC).                           - Two 9 to 18 mm polyps in the transverse colon and                            in the ascending colon, removed with a hot snare.                            Resected and retrieved.                           - One 2 mm polyp in the transverse colon, removed                            with a cold biopsy forceps. Resected and retrieved.                           - Diverticulosis in the sigmoid colon and in the                            descending colon.                           - Non-bleeding internal hemorrhoids. Recommendation:           - Patient has a contact number available for                            emergencies. The signs and symptoms of potential                            delayed complications were discussed with the  patient. Return to normal activities tomorrow.                            Written discharge instructions were provided to the                            patient.                           - Full liquid diet today, then advance as tolerated                            to mechanical soft diet.                           - Continue present medications.                           - Resume Xarelto (rivaroxaban) at prior dose in 7                            days. Refer to managing physician for further                            adjustment of therapy.                           - Monitor Hgb and transfuse as needed                           - Await pathology results.                           - Repeat colonoscopy in 3 years for surveillance                            based on pathology results.                           - Follow up in GI office next available appointment                            in 6-8 weeks Procedure Code(s):        --- Professional ---                            [phone], 22,59, Colonoscopy, flexible; with control                            of bleeding, any method                           45385, Colonoscopy, flexible; with removal of                            tumor(s), polyp(s), or other lesion(s) by snare  technique                           [id number], 59, Colonoscopy, flexible; with biopsy,                            single or multiple Diagnosis Code(s):        --- Professional ---                           K92.2, Gastrointestinal hemorrhage, unspecified                           K55.21, Angiodysplasia of colon with hemorrhage                           D12.3, Benign neoplasm of transverse colon (hepatic                            flexure or splenic flexure)                           D12.2, Benign neoplasm of ascending colon                           K64.8, Other hemorrhoids                           K57.30, Diverticulosis of large intestine without                            perforation or abscess without bleeding CPT copyright 2017 [employer]. All rights reserved. The codes documented in this report are preliminary and upon coder review may  be revised to meet current compliance requirements. Mauri Pole, MD 08/15/2017 10:27:10 AM This report has been signed electronically. Number of Addenda: 0

## 2017-08-15 NOTE — Progress Notes (Signed)
PROGRESS NOTE  [name] [name] LFY:[id number] DOB: 1940/05/23 DOA: 08/13/2017 PCP: Gregor Hams, MD  HPI/Recap of past 24 hours:  He has egd/colonoscopy this am, he is on full liquid diet He is Feeling much better , no dizziness He Denies pain, no fever, no n/v (he reports vomited at home when he felt dizzy, denies hematemesis) Wife at bedside   Assessment/Plan: Principal Problem:   Upper GI bleed Active Problems:   GERD (gastroesophageal reflux disease)   CKD (chronic kidney disease), stage III (HCC)   S/P minimally invasive mitral valve repair    S/P minimally invasive maze operation for atrial fibrillation   Acute blood loss anemia   ARF (acute renal failure) (HCC)   Hyperkalemia  Symptomatic anemia - heme positive stool, INR 1.24, platelet 288, total bilirubin 0.7 -hgb 5.5 on presentation , status post 3 units PRBC transfusion, hemoglobin on repeat at 8.5 -Suspect upper GI bleed in the setting of anticoagulation,  apixaban held, he also reports alcohol use, he is on PPI -GI consulted,  Colonoscopy:                           "Blood in the entire examined colon.                            - A single bleeding colonic angioectasia. Treated                            with argon plasma coagulation (APC).                           - Two 9 to 18 mm polyps in the transverse colon and                            in the ascending colon, removed with a hot snare.                            Resected and retrieved.                           - One 2 mm polyp in the transverse colon, removed                            with a cold biopsy forceps. Resected and retrieved.                           - Diverticulosis in the sigmoid colon and in the                            descending colon.                           - Non-bleeding internal hemorrhoids."  EGD:                 Esophagogastric landmarks identified.                           - No gross lesions in  esophagus.                   - Small hiatal hernia.                           - Erythematous duodenopathy.                           - No specimens collected.    GI oked to resume anticoagulation at prior dose in 7 days. He is currently on ppi bid, will follow GI instruction about ppi dosing.     AKI on CKD3 BUN 36 creatinine 1.79 in the setting of suspected upper GI bleed -Baseline creatinine 1.3 in April 2019 -ua unremarkable  Atrial fibrillation-status post Maze procedure.  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  PFO-status post closure.  status post mitral valve repair in 04/2017-continue SBE prophylaxis   chronic diastolic congestive heart failure-patient is euvolemic -cxr on presentation: "Small left pleural effusion similar to the prior study from 04/2017. 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  X-ray done yesterday did not show any acute findings.  Patient is unable to abduct his arm more than about 100 degrees.  He will need outpatient work-up for same     Code Status: full  Family Communication: patient and wife at bedside  Disposition Plan: home in am  with gi clearance   Consultants:  [facility]  Procedures:  egd/colonoscopy on 7/13  Antibiotics:  none   Objective: BP (!) 113/49   Pulse 100   Temp 98.8 F (37.1 C) (Oral)   Resp 20   Ht 5\' 5"  (1.651 m)   Wt 71 kg (156 lb 8.4 oz)   SpO2 100%   BMI 26.05 kg/m   Intake/Output  Summary (Last 24 hours) at 08/15/2017 1031 Last data filed at 08/15/2017 0958 Gross per 24 hour  Intake 722 ml  Output 400 ml  Net 322 ml   Filed Weights   08/13/17 1411 08/13/17 1700  Weight: 68 kg (150 lb) 71 kg (156 lb 8.4 oz)    Exam: Patient is examined daily including today on 08/15/2017, exams remain the same as of yesterday except that has changed    General:  NAD  Cardiovascular: RRR  Respiratory: CTABL  Abdomen: Soft/ND/NT, positive BS  Musculoskeletal: No Edema, right shoulder limited range of motion due to pain (not able to lift above shoulder)  Neuro: alert, oriented   Data Reviewed: Basic Metabolic Panel: Recent Labs  Lab 08/13/17 1447 08/14/17 0606 08/15/17 0508  NA 135 136 138  K 5.4* 4.5 4.1  CL 102 102 106  CO2 22 23 23   GLUCOSE 160* 98 106*  BUN 36* 34* 26*  CREATININE 1.70* 1.79* 1.70*  CALCIUM 9.4 8.7* 9.0   Liver Function Tests: Recent Labs  Lab 08/13/17 1447 08/14/17 0606  AST 28 21  ALT 18 16  ALKPHOS 105 78  BILITOT 0.3 0.7  PROT 6.3*  5.3*  ALBUMIN 3.5 3.0*   Recent Labs  Lab 08/13/17 1447  LIPASE 50   No results for input(s): AMMONIA in the last 168 hours. CBC: Recent Labs  Lab 08/13/17 1447 08/14/17 0606 08/14/17 2058 08/15/17 0508  WBC 11.6* 8.3 11.1* 8.6  NEUTROABS 10.0*  --   --   --   HGB 5.5* 8.5* 9.7* 8.8*  HCT 17.7* 25.4* 30.0* 27.3*  MCV 97.8 92.4 94.6 94.1  PLT 415* 288 360 310   Cardiac Enzymes:   No results for input(s): CKTOTAL, CKMB, CKMBINDEX, TROPONINI in the last 168 hours. BNP (last 3 results) No results for input(s): BNP in the last 8760 hours.  ProBNP (last 3 results) Recent Labs    10/24/16 1010  PROBNP 1,313.0*    CBG: Recent Labs  Lab 08/14/17 1750 08/14/17 2004 08/15/17 0002 08/15/17 0442 08/15/17 0734  GLUCAP 123* 131* 126* 112* 99    Recent Results (from the past 240 hour(s))  MRSA PCR Screening     Status: None   Collection Time: 08/14/17  3:31 AM  Result Value Ref  Range Status   MRSA by PCR NEGATIVE NEGATIVE Final    Comment:        The GeneXpert MRSA Assay (FDA approved for NASAL specimens only), is one component of a comprehensive MRSA colonization surveillance program. It is not intended to diagnose MRSA infection nor to guide or monitor treatment for MRSA infections. Performed at [facility], [place] [address] [place], [place] [address]      Studies: US Abdomen Limited  Result Date: 08/14/2017 CLINICAL DATA:  Evaluate for cirrhosis. EXAM: ULTRASOUND ABDOMEN LIMITED RIGHT UPPER QUADRANT COMPARISON:  CT abdomen pelvis dated March 20, 2017. FINDINGS: Gallbladder: No gallstones or wall thickening visualized. No sonographic Murphy sign noted by sonographer. Common bile duct: Diameter: 4 mm, normal. Liver: No focal lesion identified. Within normal limits in parenchymal echogenicity. Portal vein is patent on color Doppler imaging with normal direction of blood flow towards the liver. IMPRESSION: 1. Normal right upper quadrant ultrasound. No sonographic evidence of cirrhosis. No ascites. Electronically Signed   By: Titus Dubin M.D.   On: 08/14/2017 17:25    Scheduled Meds: . [MAR Hold] furosemide  20 mg Intravenous Once  . [MAR Hold] insulin aspart  0-9 Units Subcutaneous Q4H  . [MAR Hold] pantoprazole  40 mg Intravenous Q12H  . [MAR Hold] sertraline  100 mg Oral Daily  . [MAR Hold] sodium chloride flush  3 mL Intravenous Q12H    Continuous Infusions: . [MAR Hold] sodium chloride       Time spent: 3mins I have personally reviewed and interpreted on  08/15/2017 daily labs, tele strips, imagings as discussed above under date review session and assessment and plans.  I reviewed all nursing notes, pharmacy notes, consultant notes,  vitals, pertinent old records  I have discussed plan of care as described above with RN , patient and family on 08/15/2017   Florencia Reasons MD, PhD  [facility] Pager [phone]. If 7PM-7AM, please  contact night-coverage at [email or web address], password [facility] 08/15/2017, 10:31 AM  LOS: 2 days

## 2017-08-15 NOTE — Anesthesia Preprocedure Evaluation (Signed)
Anesthesia Evaluation  Patient identified by MRN, date of birth, ID band Patient awake    Reviewed: Allergy & Precautions, NPO status , Patient's Chart, lab work & pertinent test results  Airway Mallampati: III  TM Distance: >3 FB Neck ROM: Full    Dental no notable dental hx.    Pulmonary former smoker,    Pulmonary exam normal breath sounds clear to auscultation       Cardiovascular hypertension, Pt. on medications Normal cardiovascular exam+ dysrhythmias (s/p MAZE) Atrial Fibrillation  Rhythm:Regular Rate:Normal  ECG: SR ,rate 98  S/p mini MVR  ECHO:  Left ventricle: The cavity size was normal. Wall thickness was normal. Systolic function was vigorous. The estimated ejection fraction was in the range of 65% to 70%. Aortic valve: There was trivial regurgitation. Mitral valve: s/p MV annuloplasty. No stenosis Trivial regurgitation. Calcified annulus. Mildly thickened leaflets . Pulmonary arteries: PA peak pressure: 42 mm Hg (S).      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  Reproductive/Obstetrics                             Anesthesia Physical Anesthesia Plan  ASA: III  Anesthesia Plan: MAC   Post-op Pain Management:    Induction: Intravenous  PONV Risk Score and Plan: 1 and Propofol infusion and Treatment may vary due to age or medical condition  Airway Management Planned: Natural Airway and Nasal Cannula  Additional Equipment:   Intra-op Plan:   Post-operative Plan:   Informed Consent: I have reviewed the patients History and Physical, chart, labs and discussed the procedure  including the risks, benefits and alternatives for the proposed anesthesia with the patient or authorized representative who has indicated his/her understanding and acceptance.   Dental advisory given  Plan Discussed with: CRNA  Anesthesia Plan Comments:         Anesthesia Quick Evaluation

## 2017-08-15 NOTE — Anesthesia Postprocedure Evaluation (Signed)
Anesthesia Post Note  Patient: [name] [name]  Procedure(s) Performed: ESOPHAGOGASTRODUODENOSCOPY (EGD) WITH PROPOFOL (N/A ) COLONOSCOPY WITH PROPOFOL (N/A ) POLYPECTOMY HOT HEMOSTASIS (ARGON PLASMA COAGULATION/BICAP) (N/A )     Patient location during evaluation: PACU Anesthesia Type: MAC Level of consciousness: awake and alert Pain management: pain level controlled Vital Signs Assessment: post-procedure vital signs reviewed and stable Respiratory status: spontaneous breathing, nonlabored ventilation, respiratory function stable and patient connected to nasal cannula oxygen Cardiovascular status: stable and blood pressure returned to baseline Postop Assessment: no apparent nausea or vomiting Anesthetic complications: no    Last Vitals:  Vitals:   08/15/17 1010 08/15/17 1403  BP: (!) 113/49 (!) 127/57  Pulse: 100 (!) 104  Resp: 20 18  Temp:  37.5 C  SpO2: 100% 99%    Last Pain:  Vitals:   08/15/17 1403  TempSrc: Oral  PainSc:                  Ryan P Ellender

## 2017-08-15 NOTE — Progress Notes (Signed)
Pt down for EGD/colonoscopy at 0830. Pt alert and oriented, per report was having non bloody BMs with Moviprep. Pt NPO after midnight. Family at bedside, pt removed all jewelry and devices.

## 2017-08-15 NOTE — Interval H&P Note (Signed)
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 After consideration of risks, benefits and other options for treatment, the patient has consented to  Procedure(s): ESOPHAGOGASTRODUODENOSCOPY (EGD) WITH PROPOFOL (N/A) COLONOSCOPY WITH PROPOFOL (N/A) as a surgical intervention .  The patient's history has been reviewed, patient examined, no change in status, stable for surgery.  I have reviewed the patient's chart and labs.  Questions were answered to the patient's satisfaction.     Normal Recinos

## 2017-08-15 NOTE — Transfer of Care (Signed)
Immediate Anesthesia Transfer of Care Note  Patient: [name] [name]  Procedure(s) Performed: ESOPHAGOGASTRODUODENOSCOPY (EGD) WITH PROPOFOL (N/A ) COLONOSCOPY WITH PROPOFOL (N/A ) POLYPECTOMY HOT HEMOSTASIS (ARGON PLASMA COAGULATION/BICAP) (N/A )  Patient Location: Endoscopy Unit  Anesthesia Type:MAC  Level of Consciousness: awake  Airway & Oxygen Therapy: Patient Spontanous Breathing and Patient connected to nasal cannula oxygen  Post-op Assessment: Report given to RN and Post -op Vital signs reviewed and stable  Post vital signs: Reviewed and stable  Last Vitals:  Vitals Value Taken Time  BP 99/45 08/15/2017 10:00 AM  Temp 37.1 C 08/15/2017 10:00 AM  Pulse 105 08/15/2017 10:00 AM  Resp 12 08/15/2017 10:00 AM  SpO2 100 % 08/15/2017 10:00 AM    Last Pain:  Vitals:   08/15/17 1000  TempSrc: Oral  PainSc: 0-No pain      Patients Stated Pain Goal: 0 (42/59/56 [id number])  Complications: No apparent anesthesia complications

## 2017-08-15 NOTE — Op Note (Signed)
[facility] Patient Name: [name] [name] Procedure Date : 08/15/2017 MRN: [id number] Attending MD: Mauri Pole , MD Date of Birth: [birth year] CSN: [id number] Age: 77 Admit Type: Inpatient Procedure:                Upper GI endoscopy Indications:              Active gastrointestinal bleeding, Gastrointestinal                            bleeding of unknown origin Providers:                Mauri Pole, MD, Elna Breslow, RN, Laurena Spies, Technician, Clearnce Sorrel, CRNA Referring MD:              Medicines:                Monitored Anesthesia Care Complications:            No immediate complications. Estimated Blood Loss:     Estimated blood loss: none. Procedure:                Pre-Anesthesia Assessment:                           - Prior to the procedure, a History and Physical                            was performed, and patient medications and                            allergies were reviewed. The patient's tolerance of                            previous anesthesia was also reviewed. The risks                            and benefits of the procedure and the sedation                            options and risks were discussed with the patient.                            All questions were answered, and informed consent                            was obtained. Prior Anticoagulants: The patient                            last took Xarelto (rivaroxaban) 2 days prior to the                            procedure. ASA Grade Assessment: III - A patient  with severe systemic disease. After reviewing the                            risks and benefits, the patient was deemed in                            satisfactory condition to undergo the procedure.                           After obtaining informed consent, the endoscope was                            passed under direct vision. Throughout the                 procedure, the patient's blood pressure, pulse, and                            oxygen saturations were monitored continuously. The                            was introduced through the mouth, and advanced to                            the second part of duodenum. The upper GI endoscopy                            was accomplished without difficulty. The patient                            tolerated the procedure well. Scope In: Scope Out: Findings:      Esophagogastric landmarks were identified: the Z-line was found at 42 cm       and the site of hiatal narrowing was found at 45 cm from the incisors.      No gross lesions were noted in the entire esophagus.      A small hiatal hernia was present.      The cardia and gastric fundus were normal on retroflexion.      Patchy mildly erythematous mucosa without active bleeding and with no       stigmata of bleeding was found in the duodenal bulb and in the second       portion of the duodenum. Impression:               - Esophagogastric landmarks identified.                           - No gross lesions in esophagus.                           - Small hiatal hernia.                           - Erythematous duodenopathy.                           - No specimens collected. Recommendation:           -  Patient has a contact number available for                            emergencies. The signs and symptoms of potential                            delayed complications were discussed with the                            patient. Return to normal activities tomorrow.                            Written discharge instructions were provided to the                            patient.                           - See the other procedure note for documentation of                            additional recommendations. Procedure Code(s):        --- Professional ---                           [phone], Esophagogastroduodenoscopy, flexible,                             transoral; diagnostic, including collection of                            specimen(s) by brushing or washing, when performed                            (separate procedure) Diagnosis Code(s):        --- Professional ---                           K44.9, Diaphragmatic hernia without obstruction or                            gangrene                           K31.89, Other diseases of stomach and duodenum                           K92.2, Gastrointestinal hemorrhage, unspecified CPT copyright 2017 [employer]. All rights reserved. The codes documented in this report are preliminary and upon coder review may  be revised to meet current compliance requirements. Mauri Pole, MD 08/15/2017 10:16:59 AM This report has been signed electronically. Number of Addenda: 0

## 2017-08-16 ENCOUNTER — Encounter (HOSPITAL_COMMUNITY): Payer: Self-pay | Admitting: Gastroenterology

## 2017-08-16 DIAGNOSIS — Z7901 Long term (current) use of anticoagulants: Secondary | ICD-10-CM

## 2017-08-16 DIAGNOSIS — K7460 Unspecified cirrhosis of liver: Secondary | ICD-10-CM

## 2017-08-16 LAB — CBC
HEMATOCRIT: 31.8 % — AB (ref 39.0–52.0)
Hemoglobin: 10.2 g/dL — ABNORMAL LOW (ref 13.0–17.0)
MCH: 30.4 pg (ref 26.0–34.0)
MCHC: 32.1 g/dL (ref 30.0–36.0)
MCV: 94.9 fL (ref 78.0–100.0)
Platelets: 397 10*3/uL (ref 150–400)
RBC: 3.35 MIL/uL — AB (ref 4.22–5.81)
RDW: 15.5 % (ref 11.5–15.5)
WBC: 10.6 10*3/uL — AB (ref 4.0–10.5)

## 2017-08-16 LAB — BASIC METABOLIC PANEL
Anion gap: 8 (ref 5–15)
BUN: 14 mg/dL (ref 8–23)
CHLORIDE: 103 mmol/L (ref 98–111)
CO2: 26 mmol/L (ref 22–32)
Calcium: 9.2 mg/dL (ref 8.9–10.3)
Creatinine, Ser: 1.53 mg/dL — ABNORMAL HIGH (ref 0.61–1.24)
GFR, EST AFRICAN AMERICAN: 49 mL/min — AB (ref >60)
GFR, EST NON AFRICAN AMERICAN: 42 mL/min — AB (ref >60)
Glucose, Bld: 120 mg/dL — ABNORMAL HIGH (ref 70–99)
POTASSIUM: 3.9 mmol/L (ref 3.5–5.1)
SODIUM: 137 mmol/L (ref 135–145)

## 2017-08-16 LAB — GLUCOSE, CAPILLARY
Glucose-Capillary: 104 mg/dL — ABNORMAL HIGH (ref 70–99)
Glucose-Capillary: 107 mg/dL — ABNORMAL HIGH (ref 70–99)
Glucose-Capillary: 114 mg/dL — ABNORMAL HIGH (ref 70–99)

## 2017-08-16 MED ORDER — OMEPRAZOLE 20 MG PO CPDR: 20.0000 mg | DELAYED_RELEASE_CAPSULE | Freq: Every day | ORAL | 0 refills | Status: DC

## 2017-08-16 NOTE — Discharge Summary (Signed)
Discharge Summary  [name] [name] YIR:[id number] DOB: 04/28/1940  PCP: Gregor Hams, MD  Admit date: 08/13/2017 Discharge date: 08/16/2017  Time spent: 50mins  Recommendations for Outpatient Follow-up:  1. F/u with PMD within a week  for hospital discharge follow up, repeat cbc/bmp at follow up. 2. F/u with GI Dr Silverio Decamp for gi bleed and possible cirrhosis  3. F/u with cardiology Dr [name] to discuss long term use of Eliquis  Discharge Diagnoses:  Active Hospital Problems   Diagnosis Date Noted  . Upper GI bleed 08/13/2017  . Symptomatic anemia   . Polyp of ascending colon   . Polyp of transverse colon   . Liver cirrhosis ([place])   . Hematochezia   . AVM (arteriovenous malformation) of colon with hemorrhage   . Acute blood loss anemia 08/13/2017  . ARF (acute renal failure) ([place]) 08/13/2017  . Hyperkalemia 08/13/2017  . S/P minimally invasive maze operation for atrial fibrillation 04/02/2017  . S/P minimally invasive mitral valve repair  04/02/2017  . CKD (chronic kidney disease), stage III ([place]) 10/24/2016  . GERD (gastroesophageal reflux disease) 10/23/2016    Resolved Hospital Problems  No resolved problems to display.    Discharge Condition: stable  Diet recommendation: heart healthy/carb modified, avoid alcohol  Filed Weights   08/13/17 1411 08/13/17 1700  Weight: 68 kg (150 lb) 71 kg (156 lb 8.4 oz)    History of present illness: (admitting MD Dr [place] Pink) PCP: Gregor Hams, MD Patient supposedly has an appointment with this provider on 7/12. Specialists: Dr. [name] is his cardiologist.  Dr. Ricard Dillon is his cardiothoracic surgeon.  Chief Complaint: Feeling fatigued and dizzy  HPI: [name] [name] is a 77 y.o. male with a past medical history of atrial fibrillation status post maze procedure, history of mitral regurgitation status post valvuloplasty, both of these were done in February 2019, the patient is anticoagulated with Eliquis,  diabetes mellitus type 2, history of hypertension who was doing well until about 2 weeks ago when he started going to outpatient cardiac rehab.  Started developing pain in the right shoulder area and in the back.  And then about 3 to 4 days ago patient noticed that he was getting acid reflux.  He took Prilosec with good relief.  But he also started noticing dark-colored stool.  He had a few episodes of vomiting but without any blood in the emesis.  Denies any red blood in the stool.  Denies any abdominal pain.  No chest pain.  Some shortness of breath with exertion.  He has been dizzy but denies any passing out episodes.  He has never had upper endoscopy.  He is never had a colonoscopy. Denies NSAID use.  Patient was sent over to the urgent care center yesterday by cardiac rehab.  He underwent a shoulder x-ray which was unremarkable.  Blood work was done.  Results were available late yesterday which showed a hemoglobin of 5.  The patient was called and asked to come to the emergency department.  In the emergency department he was again found to have a hemoglobin of 5.5.  Stool was noted to be dark but heme positive.  Patient will need to be hospitalized for further management.     Hospital Course:  Principal Problem:   Upper GI bleed Active Problems:   GERD (gastroesophageal reflux disease)   CKD (chronic kidney disease), stage III ([facility])   S/P minimally invasive mitral valve repair    S/P minimally invasive maze operation for atrial  fibrillation   Acute blood loss anemia   ARF (acute renal failure) (H[facility])   Hyperkalemia   Symptomatic anemia   Polyp of ascending colon   Polyp of transverse colon   Liver cirrhosis (H[facility])   Hematochezia   AVM (arteriovenous malformation) of colon with hemorrhage   Symptomatic anemia - heme positive stool, INR 1.24, platelet 288, total bilirubin 0.7 -hgb 5.5 on presentation , status post 3 units PRBC transfusion -Suspect upper GI bleed in the setting of  anticoagulation,  apixaban held, he also reports alcohol use, he is started on PPI -GI consulted,  Colonoscopy:                           "Blood in the entire examined colon.  - A single bleeding colonic angioectasia. Treated  with argon plasma coagulation (APC). - Two 9 to 18 mm polyps in the transverse colon and  in the ascending colon, removed with a hot snare.  Resected and retrieved. - One 2 mm polyp in the transverse colon, removed  with a cold biopsy forceps. Resected and retrieved. - Diverticulosis in the sigmoid colon and in the  descending colon. - Non-bleeding internal hemorrhoids."  EGD:                 Esophagogastric landmarks identified. - No gross lesions in esophagus. - Small hiatal hernia. - Erythematous duodenopathy. - No specimens collected.    GI oked to resume anticoagulation at prior dose in 7 days. Can continue baby ASA, he is discharged on daily omeprazole 20mg  per GI recommendation. hgb stable post procedure, no more sign of active bleed.     AKI on CKD3 -BUN 36 creatinine 1.79 in the setting of suspected upper GI bleed -Baseline creatinine 1.3 in April 2019 -ua unremarkable -bun 14/cr 1.5 at discharge, he is to follow up with pmd and repeat labs.  Atrial fibrillation-status post Maze procedure. 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  PFO-status post closure.  status post mitral valve repair in 04/2017-continue SBE  prophylaxis   chronic diastolic congestive heart failure-patient is euvolemic -cxr on presentation: "Small left pleural effusion similar to the prior study from 04/2017. Negative for heart failure or pneumonia." -no edema -Lasix held in the hospital, resume at discharge ( he takes lasix prn at home)  Non-insulin-dependent type 2 diabetes Home meds Metformin held, resumed at discharge Blood glucose stable  Hypertension -Irbesartan held in the setting of GI bleed and mild hyperkalemia on presentation -resumed at discharge  Hyperkalemia  potassium 5.4 on presentation Hold irbesartan, repeat 4.5 Resumed at discharge, he reports has been on irbesartan for many years without issue, he is to follow up with cardiology regarding long terms meds management.  Right shoulder pain: Patient likely has underlying arthropathy probably aggravated by recent exercise routine. X-ray done yesterday did not show any acute findings. Patient is unable to abduct his arm more than about 100 degrees. He will need outpatient work-up with pmd.     Code Status: full  Family Communication: patient and wife at bedside  Disposition Plan: home   with gi clearance   Consultants:  [facility]  Procedures:  egd/colonoscopy on 7/13  Antibiotics:  none   Discharge Exam: BP 125/65 (BP Location: Right Arm)   Pulse 94   Temp 99.1 F (37.3  C) (Oral)   Resp 17   Ht 5\' 5"  (1.651 m)   Wt 71 kg (156 lb 8.4 oz)   SpO2 97%   BMI 26.05 kg/m   General: NAD Cardiovascular: RRR Respiratory: CTABL  Discharge Instructions You were cared for by a hospitalist during your hospital stay. If you have any questions about your discharge medications or the care you received while you were in the hospital after you are discharged, you can call the unit and asked to speak with the hospitalist on call if the hospitalist that took care of you is not available. Once you are discharged, your primary care  physician will handle any further medical issues. Please note that NO REFILLS for any discharge medications will be authorized once you are discharged, as it is imperative that you return to your primary care physician (or establish a relationship with a primary care physician if you do not have one) for your aftercare needs so that they can reassess your need for medications and monitor your lab values.  Discharge Instructions    Diet - low sodium heart healthy   Complete by:  As directed    Carb modification   Increase activity slowly   Complete by:  As directed      Allergies as of 08/16/2017      Reactions   Penicillins Anaphylaxis   Has patient had a PCN reaction causing immediate rash, facial/tongue/throat swelling, SOB or lightheadedness with hypotension: yes Has patient had a PCN reaction causing severe rash involving mucus membranes or skin necrosis: no Has patient had a PCN reaction that required hospitalization: no Has patient had a PCN reaction occurring within the last 10 years: no If all of the above answers are "NO", then may proceed with Cephalosporin use.   Shellfish Allergy Anaphylaxis   Amiodarone Nausea And Vomiting   Fenofibrate    UNSPECIFIED REACTION  "Dr told pt to discontinue med"      Medication List    STOP taking these medications   apixaban 5 MG Tabs tablet Commonly known as:  ELIQUIS     TAKE these medications   acetaminophen 500 MG tablet Commonly known as:  TYLENOL Take 500 mg by mouth every 6 (six) hours as needed.   ALPRAZolam 0.25 MG tablet Commonly known as:  XANAX Take 0.25 mg by mouth daily.   aspirin 81 MG EC tablet Take 1 tablet (81 mg total) by mouth daily.   atorvastatin 80 MG tablet Commonly known as:  LIPITOR Take 80 mg by mouth daily.   BILBERRY PO Take 1 capsule by mouth daily.   CALCIUM-MAGNESIUM-ZINC PO Take 1 capsule by mouth daily.   diphenhydramine-acetaminophen 25-500 MG Tabs tablet Commonly known as:  TYLENOL  PM Take 1 tablet by mouth at bedtime.   furosemide 40 MG tablet Commonly known as:  LASIX Take 0.5 tablets (20 mg total) by mouth every other day. Take 40 mg daily until at preop weight , then go to 20 mg every other day as previously What changed:    when to take this  reasons to take this  additional instructions   irbesartan 150 MG tablet Commonly known as:  AVAPRO TAKE 1 TABLET BY MOUTH EVERY DAY   Melatonin 5 MG Caps Take 1 capsule by mouth at bedtime.   metFORMIN 500 MG tablet Commonly known as:  GLUCOPHAGE Take 1 tablet (500 mg total) by mouth 2 (two) times daily with a meal.   multivitamin with minerals tablet Take 1  tablet by mouth daily.   omeprazole 20 MG capsule Commonly known as:  PRILOSEC Take 1 capsule (20 mg total) by mouth daily.   ondansetron 4 MG tablet Commonly known as:  ZOFRAN Take 1 tablet (4 mg total) by mouth every 8 (eight) hours as needed for nausea or vomiting.   Potassium Chloride ER 20 MEQ Tbcr Take 20 mEq by mouth daily. Take only on days you take lasix What changed:    when to take this  reasons to take this  additional instructions   sertraline 50 MG tablet Commonly known as:  ZOLOFT Take 100 mg by mouth daily.   traMADol 50 MG tablet Commonly known as:  ULTRAM Take 50 mg by mouth every 6 (six) hours as needed for moderate pain.      Allergies  Allergen Reactions  . Penicillins Anaphylaxis    Has patient had a PCN reaction causing immediate rash, facial/tongue/throat swelling, SOB or lightheadedness with hypotension: yes Has patient had a PCN reaction causing severe rash involving mucus membranes or skin necrosis: no Has patient had a PCN reaction that required hospitalization: no Has patient had a PCN reaction occurring within the last 10 years: no If all of the above answers are "NO", then may proceed with Cephalosporin use.   . Shellfish Allergy Anaphylaxis  . Amiodarone Nausea And Vomiting  . Fenofibrate      UNSPECIFIED REACTION  "Dr told pt to discontinue med"   Follow-up Information    Gregor Hams, MD Follow up in 1 week(s).   Specialties:  Family Medicine, Emergency Medicine Why:  hospital discharge follow up, repeat cbc/bmp at follow up. Contact information: [address] [place] [address] [phone]        Lelon Perla, MD Follow up.   Specialty:  Cardiology Why:  please hold eliquis for 7 days, discuss wtih Dr [name] regarding Eliquis duration in the future. Contact information: [address] [place] [address] [phone]        Mauri Pole, MD Follow up in 1 month(s).   Specialty:  Gastroenterology Why:  follow up for GI bleed. and liver function, please avoid alcoholic beverages.  Contact information: [place]  [address] [phone]            The results of significant diagnostics from this hospitalization (including imaging, microbiology, ancillary and laboratory) are listed below for reference.    Significant Diagnostic Studies: Dg Chest 2 View  Result Date: 08/13/2017 CLINICAL DATA:  Dyspnea EXAM: CHEST - 2 VIEW COMPARISON:  04/27/2017 FINDINGS: Mitral valve replacement. Left atrial appendage clip unchanged. Heart size within normal limits. Negative for edema. Small left effusion unchanged. No significant right effusion. Negative for pneumonia. Chronic fracture left scapula unchanged. IMPRESSION: Small left pleural effusion similar to the prior study. Negative for heart failure or pneumonia. Electronically Signed   By: Franchot Gallo M.D.   On: 08/13/2017 15:22   Dg Shoulder Right  Result Date: 08/12/2017 CLINICAL DATA:  Increasing right shoulder pain, limited range of motion, no acute injury EXAM: RIGHT SHOULDER - 2+ VIEW COMPARISON:  None. FINDINGS: The right humeral head is in normal position and the glenohumeral joint space appears normal. No acute fracture is seen. Very little  degenerative change is noted for age. The right [facility] joint is normally aligned. IMPRESSION: Negative right shoulder.  Very little degenerative change for age. Electronically Signed   By: Ivar Drape M.D.   On: 08/12/2017  12:07   US Abdomen Limited  Result Date: 08/14/2017 CLINICAL DATA:  Evaluate for cirrhosis. EXAM: ULTRASOUND ABDOMEN LIMITED RIGHT UPPER QUADRANT COMPARISON:  CT abdomen pelvis dated March 20, 2017. FINDINGS: Gallbladder: No gallstones or wall thickening visualized. No sonographic Murphy sign noted by sonographer. Common bile duct: Diameter: 4 mm, normal. Liver: No focal lesion identified. Within normal limits in parenchymal echogenicity. Portal vein is patent on color Doppler imaging with normal direction of blood flow towards the liver. IMPRESSION: 1. Normal right upper quadrant ultrasound. No sonographic evidence of cirrhosis. No ascites. Electronically Signed   By: Titus Dubin M.D.   On: 08/14/2017 17:25    Microbiology: Recent Results (from the past 240 hour(s))  MRSA PCR Screening     Status: None   Collection Time: 08/14/17  3:31 AM  Result Value Ref Range Status   MRSA by PCR NEGATIVE NEGATIVE Final    Comment:        The GeneXpert MRSA Assay (FDA approved for NASAL specimens only), is one component of a comprehensive MRSA colonization surveillance program. It is not intended to diagnose MRSA infection nor to guide or monitor treatment for MRSA infections. Performed at [facility], [place] [address], [place], [place] [address]      Labs: Basic Metabolic Panel: Recent Labs  Lab 08/13/17 1447 08/14/17 0606 08/15/17 0508 08/16/17 0717  NA 135 136 138 137  K 5.4* 4.5 4.1 3.9  CL 102 102 106 103  CO2 22 23 23 26   GLUCOSE 160* 98 106* 120*  BUN 36* 34* 26* 14  CREATININE 1.70* 1.79* 1.70* 1.53*  CALCIUM 9.4 8.7* 9.0 9.2   Liver Function Tests: Recent Labs  Lab 08/13/17 1447 08/14/17 0606  AST 28 21  ALT 18 16  ALKPHOS 105 78  BILITOT 0.3  0.7  PROT 6.3* 5.3*  ALBUMIN 3.5 3.0*   Recent Labs  Lab 08/13/17 1447  LIPASE 50   No results for input(s): AMMONIA in the last 168 hours. CBC: Recent Labs  Lab 08/13/17 1447 08/14/17 0606 08/14/17 2058 08/15/17 0508 08/15/17 1713 08/16/17 0717  WBC 11.6* 8.3 11.1* 8.6 11.6* 10.6*  NEUTROABS 10.0*  --   --   --   --   --   HGB 5.5* 8.5* 9.7* 8.8* 9.5* 10.2*  HCT 17.7* 25.4* 30.0* 27.3* 29.5* 31.8*  MCV 97.8 92.4 94.6 94.1 93.9 94.9  PLT 415* 288 360 310 338 397   Cardiac Enzymes: No results for input(s): CKTOTAL, CKMB, CKMBINDEX, TROPONINI in the last 168 hours. BNP: BNP (last 3 results) No results for input(s): BNP in the last 8760 hours.  ProBNP (last 3 results) Recent Labs    10/24/16 1010  PROBNP 1,313.0*    CBG: Recent Labs  Lab 08/15/17 1817 08/15/17 2025 08/16/17 0015 08/16/17 0431 08/16/17 0814  GLUCAP 106* 156* 104* 107* 114*       Signed:  Florencia Reasons MD, PhD  [facility] 08/16/2017, 10:53 AM

## 2017-08-16 NOTE — Progress Notes (Signed)
[facility] ROUNDING NOTE   Subjective:  Feels better, no further episodes of blood per rectum or hematochezia   Objective: Vital signs in last 24 hours: Temp:  [99.1 F (37.3 C)-99.8 F (37.7 C)] 99.1 F (37.3 C) (07/14 0433) Pulse Rate:  [94-105] 94 (07/14 0433) Resp:  [17-18] 17 (07/14 0433) BP: (125-171)/(57-87) 125/65 (07/14 0433) SpO2:  [93 %-99 %] 97 % (07/14 0433) Last BM Date: 08/14/17 General: NAD Abdomen: Soft, mild distention, nontender    Intake/Output from previous day: 07/13 0701 - 07/14 0700 In: 740 [P.O.:240; I.V.:500] Out: -  Intake/Output this shift: Total I/O In: 300 [P.O.:300] Out: -    Lab Results: Recent Labs    08/15/17 0508 08/15/17 1713 08/16/17 0717  WBC 8.6 11.6* 10.6*  HGB 8.8* 9.5* 10.2*  PLT 310 338 397  MCV 94.1 93.9 94.9   BMET Recent Labs    08/14/17 0606 08/15/17 0508 08/16/17 0717  NA 136 138 137  K 4.5 4.1 3.9  CL 102 106 103  CO2 23 23 26   GLUCOSE 98 106* 120*  BUN 34* 26* 14  CREATININE 1.79* 1.70* 1.53*  CALCIUM 8.7* 9.0 9.2   LFT Recent Labs    08/13/17 1447 08/14/17 0606  PROT 6.3* 5.3*  ALBUMIN 3.5 3.0*  AST 28 21  ALT 18 16  ALKPHOS 105 78  BILITOT 0.3 0.7   PT/INR Recent Labs    08/13/17 1447  INR 1.24      Imaging/Other results: US Abdomen Limited  Result Date: 08/14/2017 CLINICAL DATA:  Evaluate for cirrhosis. EXAM: ULTRASOUND ABDOMEN LIMITED RIGHT UPPER QUADRANT COMPARISON:  CT abdomen pelvis dated March 20, 2017. FINDINGS: Gallbladder: No gallstones or wall thickening visualized. No sonographic Murphy sign noted by sonographer. Common bile duct: Diameter: 4 mm, normal. Liver: No focal lesion identified. Within normal limits in parenchymal echogenicity. Portal vein is patent on color Doppler imaging with normal direction of blood flow towards the liver. IMPRESSION: 1. Normal right upper quadrant ultrasound. No sonographic evidence of cirrhosis. No ascites. Electronically  Signed   By: Titus Dubin M.D.   On: 08/14/2017 17:25      Assessment &Plan  77 year old male with CKD, mitral valve repair, A. fib on chronic anticoagulation admitted with worsening symptomatic anemia and hematochezia That is post EGD and colonoscopy yesterday with evidence of mild duodenal apathy, active bleeding AVM in transverse colon treated with APC and removal of 3 polyps. No further bleeding. Okay to restart Xarelto on August 22, 2017.  Continue low-dose aspirin Early cirrhosis based on imaging, no evidence of decompensation or significant portal hypertension Discussed alcohol cessation Follow-up in GI office in 3 months or sooner if needed     K. Denzil Magnuson , MD [phone]  [facility]

## 2017-08-17 ENCOUNTER — Telehealth: Payer: Self-pay | Admitting: Cardiology

## 2017-08-17 ENCOUNTER — Telehealth: Payer: Self-pay

## 2017-08-17 NOTE — Telephone Encounter (Signed)
Spoke with pt wife, Aware of dr crenshaw's recommendations.  

## 2017-08-17 NOTE — Telephone Encounter (Signed)
Would hold apixaban until fu ov. Kirk Ruths

## 2017-08-17 NOTE — Telephone Encounter (Signed)
Spoke with pt wife, the patients follow up post hosp appointment is not until 09-08-17 and she was told by GI to call and see if eliquis needed to be restarted before then. His colonoscopy and endoscopy have been competed. Will forward for dr [name] review

## 2017-08-17 NOTE — Telephone Encounter (Signed)
-----   Message from Mauri Pole, MD sent at 08/16/2017 12:04 PM EDT ----- Need office follow up in 3 months.  Thanks

## 2017-08-17 NOTE — Telephone Encounter (Signed)
Recall for 3 month appointment requested in Epic.

## 2017-08-17 NOTE — Telephone Encounter (Signed)
New Message:    Wife said pt was discharged from [facility] on yesterday. She needs directions on his Eliquis please.

## 2017-08-21 ENCOUNTER — Encounter: Payer: Self-pay | Admitting: Family Medicine

## 2017-08-21 ENCOUNTER — Ambulatory Visit (INDEPENDENT_AMBULATORY_CARE_PROVIDER_SITE_OTHER): Payer: Medicare Other | Admitting: Family Medicine

## 2017-08-21 VITALS — BP 135/63 | HR 93 | Wt 153.0 lb

## 2017-08-21 DIAGNOSIS — M25511 Pain in right shoulder: Secondary | ICD-10-CM

## 2017-08-21 DIAGNOSIS — D62 Acute posthemorrhagic anemia: Secondary | ICD-10-CM | POA: Diagnosis not present

## 2017-08-21 DIAGNOSIS — Z9889 Other specified postprocedural states: Secondary | ICD-10-CM

## 2017-08-21 MED ORDER — DICLOFENAC SODIUM 1 % TD GEL: 4.0000 g | Freq: Four times a day (QID) | TRANSDERMAL | 11 refills | Status: AC

## 2017-08-21 NOTE — Patient Instructions (Addendum)
Thank you for coming in today. Recheck in 2 weeks,  Attend PT.  Use the diclofenac gel.  Do the home exercises.   We will check Hb then as well.     Call or go to the ER if you develop a large red swollen joint with extreme pain or oozing puss.
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
## 2017-08-28 ENCOUNTER — Ambulatory Visit: Payer: Medicare Other | Admitting: Physical Therapy

## 2017-08-28 ENCOUNTER — Encounter: Payer: Self-pay | Admitting: Physical Therapy

## 2017-08-28 DIAGNOSIS — G8929 Other chronic pain: Secondary | ICD-10-CM

## 2017-08-28 DIAGNOSIS — M25511 Pain in right shoulder: Secondary | ICD-10-CM | POA: Diagnosis not present

## 2017-08-28 DIAGNOSIS — M6281 Muscle weakness (generalized): Secondary | ICD-10-CM | POA: Diagnosis not present

## 2017-08-28 NOTE — Patient Instructions (Signed)
Access Code: [email or web address]  URL: [email or web address]  Date: 08/28/2017  Prepared by: Elsie Ra   Exercises  Standing Shoulder Posterior Capsule Stretch - 10 reps - 3 sets - 2x daily - 6x weekly  Standing Shoulder Row with Anchored Resistance - 10 reps - 3 sets - 2x daily - 6x weekly  Shoulder Extension with Resistance - Neutral - 10 reps - 3 sets - 2x daily - 6x weekly  Shoulder External Rotation with Anchored Resistance - 10 reps - 3 sets - 2x daily - 6x weekly  Shoulder Internal Rotation with Resistance - 10 reps - 3 sets - 2x daily - 6x weekly  Supine Shoulder Flexion with Dowel - 10 reps - 3 sets - 2x daily - 6x weekly  Supine Shoulder Abduction AAROM with Dowel - 10 reps - 2-3 sets - 2x daily - 6x weekly  Supine Shoulder External Rotation in 45 Degrees Abduction AAROM with Dowel - 10 reps - 2-3 sets - 2x daily - 6x weekly  Pt provided illustrated copy  TENS UNIT: This is helpful for muscle pain and spasm.   Search and Purchase a TENS 7000 2nd edition at www.tenspros.com. It should be less than $30.     TENS unit instructions: Do not shower or bathe with the unit on Turn the unit off before removing electrodes or batteries If the electrodes lose stickiness add a drop of water to the electrodes after they are disconnected from the unit and place on plastic sheet. If you continued to have difficulty, call the TENS unit company to purchase more electrodes. Do not apply lotion on the skin area prior to use. Make sure the skin is clean and dry as this will help prolong the life of the electrodes. After use, always check skin for unusual red areas, rash or other skin difficulties. If there are any skin problems, does not apply electrodes to the same area. Never remove the electrodes from the unit by pulling the wires. Do not use the TENS unit or electrodes other than as directed. Do not change electrode placement without consultating your therapist or physician. Keep 2  fingers with between each electrode. Wear time ratio is 2:1, on to off times.    For example on for 30 minutes off for 15 minutes and then on for 30 minutes off for 15 minutes
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
## 2017-09-04 ENCOUNTER — Ambulatory Visit (INDEPENDENT_AMBULATORY_CARE_PROVIDER_SITE_OTHER): Payer: Medicare Other | Admitting: Physical Therapy

## 2017-09-04 ENCOUNTER — Ambulatory Visit (INDEPENDENT_AMBULATORY_CARE_PROVIDER_SITE_OTHER): Payer: Medicare Other | Admitting: Family Medicine

## 2017-09-04 ENCOUNTER — Encounter: Payer: Self-pay | Admitting: Family Medicine

## 2017-09-04 VITALS — BP 144/64 | HR 91 | Wt 154.0 lb

## 2017-09-04 DIAGNOSIS — G8929 Other chronic pain: Secondary | ICD-10-CM | POA: Diagnosis not present

## 2017-09-04 DIAGNOSIS — M25511 Pain in right shoulder: Secondary | ICD-10-CM | POA: Diagnosis not present

## 2017-09-04 DIAGNOSIS — D62 Acute posthemorrhagic anemia: Secondary | ICD-10-CM | POA: Diagnosis not present

## 2017-09-04 DIAGNOSIS — M6281 Muscle weakness (generalized): Secondary | ICD-10-CM | POA: Diagnosis not present

## 2017-09-04 LAB — CBC
HCT: 28.6 % — ABNORMAL LOW (ref 38.5–50.0)
HEMOGLOBIN: 9.4 g/dL — AB (ref 13.2–17.1)
MCH: 30.6 pg (ref 27.0–33.0)
MCHC: 32.9 g/dL (ref 32.0–36.0)
MCV: 93.2 fL (ref 80.0–100.0)
MPV: 10.8 fL (ref 7.5–12.5)
Platelets: 336 10*3/uL (ref 140–400)
RBC: 3.07 10*6/uL — ABNORMAL LOW (ref 4.20–5.80)
RDW: 13.5 % (ref 11.0–15.0)
WBC: 8.1 10*3/uL (ref 3.8–10.8)
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
## 2017-09-04 NOTE — Patient Instructions (Addendum)
Thank you for coming in today. Continue current treatment.  Recheck with Evlyn Clines as scheduled on the 23rd.  Recheck with me in around 6 weeks.  Return sooner if needed.   Labs today.   TENS UNIT: This is helpful for muscle pain and spasm.   Search and Purchase a TENS 7000 2nd edition at  www.tenspros.com or [email or web address].com It should be less than $30.     TENS unit instructions: Do not shower or bathe with the unit on Turn the unit off before removing electrodes or batteries If the electrodes lose stickiness add a drop of water to the electrodes after they are disconnected from the unit and place on plastic sheet. If you continued to have difficulty, call the TENS unit company to purchase more electrodes. Do not apply lotion on the skin area prior to use. Make sure the skin is clean and dry as this will help prolong the life of the electrodes. After use, always check skin for unusual red areas, rash or other skin difficulties. If there are any skin problems, does not apply electrodes to the same area. Never remove the electrodes from the unit by pulling the wires. Do not use the TENS unit or electrodes other than as directed. Do not change electrode placement without consultating your therapist or physician. Keep 2 fingers with between each electrode. Wear time ratio is 2:1, on to off times.    For example on for 30 minutes off for 15 minutes and then on for 30 minutes off for 15 minutes

## 2017-09-07 ENCOUNTER — Telehealth: Payer: Self-pay | Admitting: Gastroenterology

## 2017-09-07 ENCOUNTER — Other Ambulatory Visit: Payer: Self-pay | Admitting: Sports Medicine

## 2017-09-07 DIAGNOSIS — K922 Gastrointestinal hemorrhage, unspecified: Secondary | ICD-10-CM

## 2017-09-07 DIAGNOSIS — K5521 Angiodysplasia of colon with hemorrhage: Secondary | ICD-10-CM

## 2017-09-07 DIAGNOSIS — D62 Acute posthemorrhagic anemia: Secondary | ICD-10-CM

## 2017-09-07 NOTE — Telephone Encounter (Signed)
The pt's wife was advised and states that the pt has an appt with PCP in 2 weeks and make sure he has labs drawn at that time.  Appt made for 09/23/17 with Alonza Bogus PA

## 2017-09-07 NOTE — Telephone Encounter (Signed)
Dr Silverio Decamp per your note the pt needs a post hospital f/u in 3 months.  The pt's wife is concerned because his Hgb has dropped since he was released from the hospital.  Is 3 months still appropriate?

## 2017-09-07 NOTE — Assessment & Plan Note (Signed)
Hemoglobin has dropped considerably since it was last checked 3 weeks ago.  Still having dark, tarry stools?  Taking iron supplementation 3 times daily?  I am going to add two anemia panel labs, please have these added onto the blood already in the lab to make sure that we are supplementing appropriately.

## 2017-09-07 NOTE — Telephone Encounter (Signed)
Please try to bring him in sooner next available appointment. Recheck Hgb in 2 weeks. Call with any overt sign of bleeding.

## 2017-09-08 ENCOUNTER — Encounter: Payer: Self-pay | Admitting: Cardiology

## 2017-09-08 ENCOUNTER — Ambulatory Visit: Payer: Medicare Other | Admitting: Cardiology

## 2017-09-08 VITALS — BP 132/66 | HR 99 | Ht 65.0 in | Wt 154.2 lb

## 2017-09-08 DIAGNOSIS — I059 Rheumatic mitral valve disease, unspecified: Secondary | ICD-10-CM | POA: Diagnosis not present

## 2017-09-08 DIAGNOSIS — I10 Essential (primary) hypertension: Secondary | ICD-10-CM

## 2017-09-08 DIAGNOSIS — E7800 Pure hypercholesterolemia, unspecified: Secondary | ICD-10-CM

## 2017-09-08 DIAGNOSIS — I480 Paroxysmal atrial fibrillation: Secondary | ICD-10-CM

## 2017-09-08 NOTE — Patient Instructions (Signed)
Your physician recommends that you schedule a follow-up appointment in: 3 MONTHS WITH DR CRENSHAW  

## 2017-09-17 ENCOUNTER — Encounter: Payer: Self-pay | Admitting: Gastroenterology

## 2017-09-18 ENCOUNTER — Ambulatory Visit: Payer: Medicare Other | Admitting: Gastroenterology

## 2017-09-23 ENCOUNTER — Telehealth: Payer: Self-pay

## 2017-09-23 ENCOUNTER — Ambulatory Visit: Payer: Medicare Other | Admitting: Gastroenterology

## 2017-09-23 NOTE — Telephone Encounter (Signed)
Patient wife has been advised. Brieann Osinski,CMA  

## 2017-09-23 NOTE — Telephone Encounter (Signed)
Patient has an appointment with Nelson Chimes PA on thr 23rd. We generally will do it in the clinic as part of the visit. It can be done with a finger stick.

## 2017-09-23 NOTE — Telephone Encounter (Signed)
Patient wife called and requested an order for Hemoglobin A1C. Please advise. Rhonda Cunningham,CMA

## 2017-09-25 ENCOUNTER — Encounter: Payer: Self-pay | Admitting: Physician Assistant

## 2017-09-25 ENCOUNTER — Ambulatory Visit (INDEPENDENT_AMBULATORY_CARE_PROVIDER_SITE_OTHER): Payer: Medicare Other | Admitting: Physician Assistant

## 2017-09-25 ENCOUNTER — Ambulatory Visit (INDEPENDENT_AMBULATORY_CARE_PROVIDER_SITE_OTHER): Payer: Medicare Other | Admitting: Physical Therapy

## 2017-09-25 VITALS — BP 161/65 | HR 84 | Wt 148.9 lb

## 2017-09-25 DIAGNOSIS — M6281 Muscle weakness (generalized): Secondary | ICD-10-CM

## 2017-09-25 DIAGNOSIS — R634 Abnormal weight loss: Secondary | ICD-10-CM | POA: Diagnosis not present

## 2017-09-25 DIAGNOSIS — Z7689 Persons encountering health services in other specified circumstances: Secondary | ICD-10-CM

## 2017-09-25 DIAGNOSIS — E1121 Type 2 diabetes mellitus with diabetic nephropathy: Secondary | ICD-10-CM

## 2017-09-25 DIAGNOSIS — Z1329 Encounter for screening for other suspected endocrine disorder: Secondary | ICD-10-CM

## 2017-09-25 DIAGNOSIS — M25511 Pain in right shoulder: Secondary | ICD-10-CM

## 2017-09-25 DIAGNOSIS — Z8719 Personal history of other diseases of the digestive system: Secondary | ICD-10-CM

## 2017-09-25 DIAGNOSIS — G252 Other specified forms of tremor: Secondary | ICD-10-CM | POA: Insufficient documentation

## 2017-09-25 DIAGNOSIS — Z9189 Other specified personal risk factors, not elsewhere classified: Secondary | ICD-10-CM

## 2017-09-25 DIAGNOSIS — E785 Hyperlipidemia, unspecified: Secondary | ICD-10-CM

## 2017-09-25 DIAGNOSIS — E1169 Type 2 diabetes mellitus with other specified complication: Secondary | ICD-10-CM

## 2017-09-25 DIAGNOSIS — N183 Chronic kidney disease, stage 3 unspecified: Secondary | ICD-10-CM

## 2017-09-25 DIAGNOSIS — G8929 Other chronic pain: Secondary | ICD-10-CM

## 2017-09-25 DIAGNOSIS — D62 Acute posthemorrhagic anemia: Secondary | ICD-10-CM

## 2017-09-25 DIAGNOSIS — B351 Tinea unguium: Secondary | ICD-10-CM
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
## 2017-09-25 NOTE — Patient Instructions (Addendum)
Diabetes Preventive Care: - annual foot exam  - annual dilated eye exam with an eye doctor - self foot exams at least weekly - pneumonia vaccine once (booster in 5 years and at age 77) - annual influenza vaccine - twice yearly dental cleanings and yearly exam - goal blood pressure <140/90, ideally <130/80 - LDL cholesterol <70 - A1C <7.0 - body mass index (BMI) <25.0 - follow-up every 3 months if your A1C is not at goal - follow-up every 6 months if diabetes is well controlled   Diabetes and Foot Care Diabetes may cause you to have problems because of poor blood supply (circulation) to your feet and legs. This may cause the skin on your feet to become thinner, break easier, and heal more slowly. Your skin may become dry, and the skin may peel and crack. You may also have nerve damage in your legs and feet causing decreased feeling in them. You may not notice minor injuries to your feet that could lead to infections or more serious problems. Taking care of your feet is one of the most important things you can do for yourself. Follow these instructions at home:  Wear shoes at all times, even in the house. Do not go barefoot. Bare feet are easily injured.  Check your feet daily for blisters, cuts, and redness. If you cannot see the bottom of your feet, use a mirror or ask someone for help.  Wash your feet with warm water (do not use hot water) and mild soap. Then pat your feet and the areas between your toes until they are completely dry. Do not soak your feet as this can dry your skin.  Apply a moisturizing lotion or petroleum jelly (that does not contain alcohol and is unscented) to the skin on your feet and to dry, brittle toenails. Do not apply lotion between your toes.  Trim your toenails straight across. Do not dig under them or around the cuticle. File the edges of your nails with an emery board or nail file.  Do not cut corns or calluses or try to remove them with medicine.  Wear  clean socks or stockings every day. Make sure they are not too tight. Do not wear knee-high stockings since they may decrease blood flow to your legs.  Wear shoes that fit properly and have enough cushioning. To break in new shoes, wear them for just a few hours a day. This prevents you from injuring your feet. Always look in your shoes before you put them on to be sure there are no objects inside.  Do not cross your legs. This may decrease the blood flow to your feet.  If you find a minor scrape, cut, or break in the skin on your feet, keep it and the skin around it clean and dry. These areas may be cleansed with mild soap and water. Do not cleanse the area with peroxide, alcohol, or iodine.  When you remove an adhesive bandage, be sure not to damage the skin around it.  If you have a wound, look at it several times a day to make sure it is healing.  Do not use heating pads or hot water bottles. They may burn your skin. If you have lost feeling in your feet or legs, you may not know it is happening until it is too late.  Make sure your health care provider performs a complete foot exam at least annually or more often if you have foot problems. Report any cuts,  sores, or bruises to your health care provider immediately. Contact a health care provider if:  You have an injury that is not healing.  You have cuts or breaks in the skin.  You have an ingrown nail.  You notice redness on your legs or feet.  You feel burning or tingling in your legs or feet.  You have pain or cramps in your legs and feet.  Your legs or feet are numb.  Your feet always feel cold. Get help right away if:  There is increasing redness, swelling, or pain in or around a wound.  There is a red line that goes up your leg.  Pus is coming from a wound.  You develop a fever or as directed by your health care provider.  You notice a bad smell coming from an ulcer or wound. This information is not intended to  replace advice given to you by your health care provider. Make sure you discuss any questions you have with your health care provider. Document Released: 01/18/2000 Document Revised: 06/28/2015 Document Reviewed: 06/29/2012 Elsevier Interactive Patient Education  2017 [employer].

## 2017-09-26 LAB — COMPLETE METABOLIC PANEL WITH GFR
AG RATIO: 1.8 (calc) (ref 1.0–2.5)
ALBUMIN MSPROF: 4.4 g/dL (ref 3.6–5.1)
ALT: 18 U/L (ref 9–46)
AST: 21 U/L (ref 10–35)
Alkaline phosphatase (APISO): 113 U/L (ref 40–115)
BILIRUBIN TOTAL: 0.3 mg/dL (ref 0.2–1.2)
BUN / CREAT RATIO: 16 (calc) (ref 6–22)
BUN: 25 mg/dL (ref 7–25)
CHLORIDE: 104 mmol/L (ref 98–110)
CO2: 24 mmol/L (ref 20–32)
Calcium: 9.6 mg/dL (ref 8.6–10.3)
Creat: 1.54 mg/dL — ABNORMAL HIGH (ref 0.70–1.18)
GFR, Est African American: 50 mL/min/{1.73_m2} — ABNORMAL LOW (ref >=60)
GFR, Est Non African American: 43 mL/min/{1.73_m2} — ABNORMAL LOW (ref >=60)
GLOBULIN: 2.5 g/dL (ref 1.9–3.7)
Glucose, Bld: 113 mg/dL (ref 65–139)
POTASSIUM: 5.6 mmol/L — AB (ref 3.5–5.3)
SODIUM: 138 mmol/L (ref 135–146)
TOTAL PROTEIN: 6.9 g/dL (ref 6.1–8.1)

## 2017-09-26 LAB — LIPID PANEL W/REFLEX DIRECT LDL
Cholesterol: 137 mg/dL (ref <200)
HDL: 47 mg/dL (ref >40)
LDL CHOLESTEROL (CALC): 68 mg/dL
Non-HDL Cholesterol (Calc): 90 mg/dL (calc) (ref <130)
Total CHOL/HDL Ratio: 2.9 (calc) (ref <5.0)
Triglycerides: 141 mg/dL (ref <150)

## 2017-09-26 LAB — CBC WITH DIFFERENTIAL/PLATELET
BASOS ABS: 49 {cells}/uL (ref 0–200)
BASOS PCT: 0.6 %
EOS ABS: 189 {cells}/uL (ref 15–500)
Eosinophils Relative: 2.3 %
HCT: 30.6 % — ABNORMAL LOW (ref 38.5–50.0)
HEMOGLOBIN: 9.8 g/dL — AB (ref 13.2–17.1)
Lymphs Abs: 894 cells/uL (ref 850–3900)
MCH: 30.5 pg (ref 27.0–33.0)
MCHC: 32 g/dL (ref 32.0–36.0)
MCV: 95.3 fL (ref 80.0–100.0)
MPV: 10.1 fL (ref 7.5–12.5)
Monocytes Relative: 9.7 %
NEUTROS ABS: 6273 {cells}/uL (ref 1500–7800)
Neutrophils Relative %: 76.5 %
Platelets: 348 10*3/uL (ref 140–400)
RBC: 3.21 10*6/uL — ABNORMAL LOW (ref 4.20–5.80)
RDW: 13.8 % (ref 11.0–15.0)
Total Lymphocyte: 10.9 %
WBC: 8.2 10*3/uL (ref 3.8–10.8)
WBCMIX: 795 {cells}/uL (ref 200–950)

## 2017-09-26 LAB — RETICULOCYTES
ABS RETIC: 19140 {cells}/uL — AB (ref 25000–9000)
RETIC CT PCT: 0.6 %

## 2017-09-26 LAB — IRON,TIBC AND FERRITIN PANEL
%SAT: 20 % (calc) (ref 20–48)
Ferritin: 60 ng/mL (ref 24–380)
Iron: 68 ug/dL (ref 50–180)
TIBC: 340 mcg/dL (calc) (ref 250–425)

## 2017-09-26 LAB — HEMOGLOBIN A1C
HEMOGLOBIN A1C: 6.1 %{Hb} — AB (ref <5.7)
MEAN PLASMA GLUCOSE: 128 (calc)
eAG (mmol/L): 7.1 (calc)

## 2017-09-26 LAB — TSH+FREE T4: TSH W/REFLEX TO FT4: 2.12 mIU/L (ref 0.40–4.50)

## 2017-09-29 ENCOUNTER — Encounter: Payer: Self-pay | Admitting: Gastroenterology

## 2017-09-29 ENCOUNTER — Ambulatory Visit: Payer: Medicare Other | Admitting: Gastroenterology

## 2017-09-29 ENCOUNTER — Encounter: Payer: Self-pay | Admitting: Physician Assistant

## 2017-09-29 VITALS — BP 126/62 | HR 89 | Ht 65.0 in | Wt 151.0 lb

## 2017-09-29 DIAGNOSIS — K5521 Angiodysplasia of colon with hemorrhage: Secondary | ICD-10-CM

## 2017-09-29 DIAGNOSIS — D62 Acute posthemorrhagic anemia: Secondary | ICD-10-CM | POA: Diagnosis not present

## 2017-09-29 DIAGNOSIS — K922 Gastrointestinal hemorrhage, unspecified: Secondary | ICD-10-CM | POA: Diagnosis not present

## 2017-09-29 NOTE — Progress Notes (Signed)
Reviewed and agree with documentation and assessment and plan. K. Veena Tericka Devincenzi , MD   
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
## 2017-09-29 NOTE — Progress Notes (Signed)
Hi [name] [name],  Your anemia is stable with hemoglobin of 9.8 and normal iron and ferritin levels. I recommend we monitor this every 3 months for now.  Your chronic kidney disease is also stable.   Your potassium was increased. How often are you taking a potassium supplement? It should only be needed when you take Lasix. I would hold the potassium and recheck your level at the end of the week.

## 2017-09-29 NOTE — Patient Instructions (Addendum)
Have hemoglobin monitored by your primary care doctor monthly.   Can try Zantac or Pepcid instead of Pantoprazole.

## 2017-10-02 ENCOUNTER — Other Ambulatory Visit: Payer: Self-pay

## 2017-10-02 ENCOUNTER — Encounter: Payer: Self-pay | Admitting: Physical Therapy

## 2017-10-02 ENCOUNTER — Ambulatory Visit (INDEPENDENT_AMBULATORY_CARE_PROVIDER_SITE_OTHER): Payer: Medicare Other | Admitting: Physical Therapy

## 2017-10-02 DIAGNOSIS — G8929 Other chronic pain: Secondary | ICD-10-CM | POA: Diagnosis not present

## 2017-10-02 DIAGNOSIS — M25511 Pain in right shoulder: Secondary | ICD-10-CM | POA: Diagnosis not present

## 2017-10-02 DIAGNOSIS — M6281 Muscle weakness (generalized): Secondary | ICD-10-CM | POA: Diagnosis not present

## 2017-10-02 DIAGNOSIS — R2689 Other abnormalities of gait and mobility: Secondary | ICD-10-CM

## 2017-10-02 DIAGNOSIS — E875 Hyperkalemia: Secondary | ICD-10-CM
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
## 2017-10-03 LAB — RENAL FUNCTION PANEL
Albumin: 4 g/dL (ref 3.6–5.1)
BUN/Creatinine Ratio: 13 (calc) (ref 6–22)
BUN: 21 mg/dL (ref 7–25)
CHLORIDE: 103 mmol/L (ref 98–110)
CO2: 28 mmol/L (ref 20–32)
CREATININE: 1.65 mg/dL — AB (ref 0.70–1.18)
Calcium: 9.6 mg/dL (ref 8.6–10.3)
GLUCOSE: 106 mg/dL — AB (ref 65–99)
PHOSPHORUS: 3.1 mg/dL (ref 2.1–4.3)
POTASSIUM: 5.6 mmol/L — AB (ref 3.5–5.3)
Sodium: 136 mmol/L (ref 135–146)

## 2017-10-07 ENCOUNTER — Encounter: Payer: Self-pay | Admitting: Physician Assistant

## 2017-10-07 ENCOUNTER — Other Ambulatory Visit: Payer: Self-pay | Admitting: Physician Assistant

## 2017-10-07 ENCOUNTER — Other Ambulatory Visit: Payer: Self-pay

## 2017-10-07 DIAGNOSIS — N183 Chronic kidney disease, stage 3 unspecified: Secondary | ICD-10-CM

## 2017-10-07 DIAGNOSIS — I10 Essential (primary) hypertension: Secondary | ICD-10-CM

## 2017-10-07 MED ORDER — IRBESARTAN 75 MG PO TABS: 75.0000 mg | ORAL_TABLET | Freq: Every day | ORAL | 0 refills | Status: DC

## 2017-10-07 NOTE — Progress Notes (Signed)
Good morning [name] [name],  Your potassium level is still high.  I would like you to stop your multivitamin and make sure you are not taking any supplement that contains potassium.  I also want you to cut your Irbesartan dose in half. I sent a new Rx to [facility], but you can cut your remaining pills in half.   Lastly, make sure you are not eating any foods or drinking any sports drinks that are high in potassium. Here is a sample list of foods to avoid for now [email or web address]  Let's recheck your level again in 1 week.  Evlyn Clines

## 2017-10-09 ENCOUNTER — Ambulatory Visit: Payer: Medicare Other | Admitting: Physical Therapy

## 2017-10-09 ENCOUNTER — Encounter: Payer: Self-pay | Admitting: Physical Therapy

## 2017-10-09 DIAGNOSIS — R293 Abnormal posture: Secondary | ICD-10-CM

## 2017-10-09 DIAGNOSIS — G8929 Other chronic pain: Secondary | ICD-10-CM

## 2017-10-09 DIAGNOSIS — M6281 Muscle weakness (generalized): Secondary | ICD-10-CM | POA: Diagnosis not present

## 2017-10-09 DIAGNOSIS — M25511 Pain in right shoulder: Secondary | ICD-10-CM
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
## 2017-10-15 ENCOUNTER — Other Ambulatory Visit: Payer: Self-pay

## 2017-10-15 DIAGNOSIS — E875 Hyperkalemia: Secondary | ICD-10-CM

## 2017-10-16 ENCOUNTER — Ambulatory Visit: Payer: Medicare Other | Admitting: Family Medicine

## 2017-10-16 ENCOUNTER — Ambulatory Visit: Payer: Medicare Other | Admitting: Physical Therapy

## 2017-10-16 DIAGNOSIS — G8929 Other chronic pain: Secondary | ICD-10-CM

## 2017-10-16 DIAGNOSIS — M6281 Muscle weakness (generalized): Secondary | ICD-10-CM

## 2017-10-16 DIAGNOSIS — M25511 Pain in right shoulder: Secondary | ICD-10-CM

## 2017-10-16 DIAGNOSIS — R293 Abnormal posture: Secondary | ICD-10-CM | POA: Diagnosis not present

## 2017-10-16 NOTE — Patient Instructions (Signed)
Scapula Adduction With Pectorals, Low   Stand in doorframe with palms against frame and arms at 45. Lean forward and squeeze shoulder blades. Hold _15-30__ seconds. Repeat _2__ times per session. Do _2__ sessions per day.  Shoulder Blade Squeeze    Rotate shoulders back, then squeeze shoulder blades together. Hold for 5-10 seconds, throughout the day.   Scapular Retraction: Elbow Flexion (Standing)  "W's"  "this is a stick up".     With elbows bent to 90, pinch shoulder blades together and rotate arms out, keeping elbows bent. Repeat __10__ times per set, hold for 5 seconds. Do __1-2__ sets per session. Do _several ___ sessions per day.  Cane Exercise: Extension    Stand holding cane behind back with both hands palm-up. Lift the cane away from body. Hold __2-5_ seconds. Repeat __5-10__ times. Do __1__ sessions per day.  Cane Exercise: Flexion    Lie on back, holding cane above chest. Keeping arms as straight as possible, lower cane toward floor beyond head. Hold __2-5__ seconds. Repeat __10__ times. Do _1___ sessions per day..    [place] [facility] [place], [place] [address]  [phone] (office) [phone] (fax)
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
## 2017-10-17 LAB — BASIC METABOLIC PANEL
BUN/Creatinine Ratio: 15 (calc) (ref 6–22)
BUN: 23 mg/dL (ref 7–25)
CALCIUM: 9.7 mg/dL (ref 8.6–10.3)
CHLORIDE: 99 mmol/L (ref 98–110)
CO2: 26 mmol/L (ref 20–32)
Creat: 1.53 mg/dL — ABNORMAL HIGH (ref 0.70–1.18)
GLUCOSE: 142 mg/dL — AB (ref 65–99)
Potassium: 4.6 mmol/L (ref 3.5–5.3)
Sodium: 135 mmol/L (ref 135–146)

## 2017-10-19 ENCOUNTER — Ambulatory Visit (INDEPENDENT_AMBULATORY_CARE_PROVIDER_SITE_OTHER): Payer: Medicare Other | Admitting: Family Medicine

## 2017-10-19 ENCOUNTER — Ambulatory Visit: Payer: Medicare Other | Admitting: Family Medicine

## 2017-10-19 VITALS — BP 136/73 | HR 94 | Ht 64.0 in | Wt 153.0 lb

## 2017-10-19 DIAGNOSIS — Z23 Encounter for immunization: Secondary | ICD-10-CM | POA: Diagnosis not present

## 2017-10-19 DIAGNOSIS — M25511 Pain in right shoulder: Secondary | ICD-10-CM

## 2017-10-19 NOTE — Patient Instructions (Signed)
Thank you for coming in today. Continue current treatment.  Plan for MRI.  Let me know if you do not hear anything.   Recheck after MRI.
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
## 2017-10-26 ENCOUNTER — Encounter: Payer: Self-pay | Admitting: Physician Assistant

## 2017-10-26 ENCOUNTER — Ambulatory Visit (INDEPENDENT_AMBULATORY_CARE_PROVIDER_SITE_OTHER): Payer: Medicare Other | Admitting: Physician Assistant

## 2017-10-26 VITALS — BP 138/75 | HR 91 | Wt 151.0 lb

## 2017-10-26 DIAGNOSIS — I059 Rheumatic mitral valve disease, unspecified: Secondary | ICD-10-CM | POA: Diagnosis not present

## 2017-10-26 DIAGNOSIS — Z6825 Body mass index (BMI) 25.0-25.9, adult: Secondary | ICD-10-CM | POA: Diagnosis not present

## 2017-10-26 DIAGNOSIS — Z87898 Personal history of other specified conditions: Secondary | ICD-10-CM | POA: Diagnosis not present

## 2017-10-26 DIAGNOSIS — N183 Chronic kidney disease, stage 3 unspecified: Secondary | ICD-10-CM

## 2017-10-26 MED ORDER — FUROSEMIDE 40 MG PO TABS: 20.0000 mg | ORAL_TABLET | ORAL | Status: DC | PRN
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
## 2017-10-26 NOTE — Patient Instructions (Signed)
Chronic Kidney Disease, Adult Chronic kidney disease (CKD) occurs when the kidneys become damaged slowly over a long period of time. The kidneys are a pair of organs that do many important jobs in the body, including:  Removing waste and extra fluid from the blood to make urine.  Making hormones that maintain the amount of fluid in tissues and blood vessels.  Maintaining the right amount of fluids and chemicals in the body.  A small amount of kidney damage may not cause problems, but a large amount of damage may make it hard or impossible for the kidneys to work the way they should. If steps are not taken to slow down kidney damage or to stop it from getting worse, the kidneys may stop working permanently (end-stage renal disease or ESRD). Most of the time, CKD does not go away, but it can often be controlled. People who have CKD are usually able to live normal lives. What are the causes? The most common causes of this condition are diabetes and high blood pressure (hypertension). Other causes include:  Heart and blood vessel (cardiovascular) disease.  Kidney diseases, such as: ? Glomerulonephritis. ? Interstitial nephritis. ? Polycystic kidney disease. ? Renal vascular disease.  Diseases that affect the immune system.  Genetic diseases.  Medicines that damage the kidneys, such as anti-inflammatory medicines.  Being around or being in contact with poisonous (toxic) substances.  A kidney or urinary infection that occurs again and again (recurs).  Vasculitis. This is swelling or inflammation of the blood vessels.  A problem with urine flow that may be caused by: ? Cancer. ? Having kidney stones more than one time. ? An enlarged prostate, in males.  What increases the risk? You are more likely to develop this condition if you:  Are older than age 60.  Are male.  Are African-American, Hispanic, Asian, Pacific Islander, or American Indian.  Are a current or former  smoker.  Are obese.  Have a family history of kidney disease or failure.  Often take medicines that are damaging to the kidneys.  What are the signs or symptoms? Symptoms of this condition include:  Swelling (edema) of the face, legs, ankles, or feet.  Tiredness (lethargy) and having less energy.  Nausea or vomiting.  Confusion or trouble concentrating.  Problems with urination, such as: ? Painful or burning feeling during urination. ? Decreased urine production. ? Frequent urination, especially at night. ? Bloody urine.  Muscle twitches and cramps, especially in the legs.  Shortness of breath.  Weakness.  Loss of appetite.  Metallic taste in the mouth.  Trouble sleeping.  Dry, itchy skin.  A low blood count (anemia).  Pale lining of the eyelids and surface of the eye (conjunctiva).  Symptoms develop slowly and may not be obvious until the kidney damage becomes severe. It is possible to have kidney disease for years without having any symptoms. How is this diagnosed? This condition may be diagnosed based on:  Blood tests.  Urine tests.  Imaging tests, such as an ultrasound or CT scan.  A test in which a sample of tissue is removed from the kidneys to be examined under a microscope (kidney biopsy).  These test results will help your health care provider determine how serious the CKD is. How is this treated? There is no cure for most cases of this condition, but treatment usually relieves symptoms and prevents or slows the progression of the disease. Treatment may include:  Making diet changes, which may require you to   avoid alcohol, salty foods (sodium), and foods that are high in potassium, calcium, and protein.  Medicines: ? To lower blood pressure. ? To control blood glucose. ? To relieve anemia. ? To relieve swelling. ? To protect your bones. ? To improve the balance of electrolytes in your blood.  Removing toxic waste from the body through  types of dialysis, if the kidneys can no longer do their job (kidney failure).  Managing any other conditions that are causing your CKD or making it worse.  Follow these instructions at home: Medicines  Take over-the-counter and prescription medicines only as told by your health care provider. The dose of some medicines that you take may need to be adjusted.  Do not take any new medicines unless approved by your health care provider. Many medicines can worsen your kidney damage.  Do not take any vitamin and mineral supplements unless approved by your health care provider. Many nutritional supplements can worsen your kidney damage. General instructions  Follow your prescribed diet as told by your health care provider.  Do not use any products that contain nicotine or tobacco, such as cigarettes and e-cigarettes. If you need help quitting, ask your health care provider.  Monitor and track your blood pressure at home. Report changes in your blood pressure as told by your health care provider.  If you are being treated for diabetes, monitor and track your blood sugar (blood glucose) levels as told by your health care provider.  Maintain a healthy weight. If you need help with this, ask your health care provider.  Start or continue an exercise plan. Exercise at least 30 minutes a day, 5 days a week.  Keep your immunizations up to date as told by your health care provider.  Keep all follow-up visits as told by your health care provider. This is important. Where to find more information:  American Association of Kidney Patients: www.aakp.org  National Kidney Foundation: www.kidney.org  American Kidney Fund: www.akfinc.org  [facility]: www.lifeoptions.org and www.kidneyschool.org Contact a health care provider if:  Your symptoms get worse.  You develop new symptoms. Get help right away if:  You develop symptoms of ESRD, which  include: ? Headaches. ? Numbness in the hands or feet. ? Easy bruising. ? Frequent hiccups. ? Chest pain. ? Shortness of breath. ? Lack of menstruation, in women.  You have a fever.  You have decreased urine production.  You have pain or bleeding when you urinate. Summary  Chronic kidney disease (CKD) occurs when the kidneys become damaged slowly over a long period of time.  The most common causes of this condition are diabetes and high blood pressure (hypertension).  There is no cure for most cases of this condition, but treatment usually relieves symptoms and prevents or slows the progression of the disease. Treatment may include a combination of medicines and lifestyle changes. This information is not intended to replace advice given to you by your health care provider. Make sure you discuss any questions you have with your health care provider. Document Released: 10/30/2007 Document Revised: 02/28/2016 Document Reviewed: 02/28/2016 Elsevier Interactive Patient Education  2018 Elsevier Inc.  

## 2017-10-29 ENCOUNTER — Telehealth: Payer: Self-pay | Admitting: Cardiology

## 2017-10-29 ENCOUNTER — Other Ambulatory Visit: Payer: Self-pay | Admitting: Physician Assistant

## 2017-10-29 NOTE — Telephone Encounter (Signed)
Please advise on if patient should be back on this? I do not see on med list.  Thank you!

## 2017-10-29 NOTE — Telephone Encounter (Signed)
New Message:    Patient would like to know when he can start back taking the Eliquis

## 2017-10-30 NOTE — Telephone Encounter (Signed)
We will discuss at next [facility]

## 2017-10-30 NOTE — Telephone Encounter (Signed)
Called patient, advised of note from MD. Patient wife verbalized understanding.

## 2017-11-06 ENCOUNTER — Encounter: Payer: Self-pay | Admitting: Physician Assistant

## 2017-11-16 ENCOUNTER — Telehealth: Payer: Self-pay | Admitting: Physician Assistant

## 2017-11-16 ENCOUNTER — Ambulatory Visit (INDEPENDENT_AMBULATORY_CARE_PROVIDER_SITE_OTHER): Payer: Medicare Other

## 2017-11-16 DIAGNOSIS — M25511 Pain in right shoulder: Secondary | ICD-10-CM

## 2017-11-16 DIAGNOSIS — S46111D Strain of muscle, fascia and tendon of long head of biceps, right arm, subsequent encounter: Secondary | ICD-10-CM | POA: Diagnosis not present

## 2017-11-16 DIAGNOSIS — X58XXXD Exposure to other specified factors, subsequent encounter: Secondary | ICD-10-CM | POA: Diagnosis not present

## 2017-11-16 DIAGNOSIS — S46811D Strain of other muscles, fascia and tendons at shoulder and upper arm level, right arm, subsequent encounter: Secondary | ICD-10-CM | POA: Diagnosis not present

## 2017-11-16 MED ORDER — ATORVASTATIN CALCIUM 80 MG PO TABS: 80.0000 mg | ORAL_TABLET | Freq: Every day | ORAL | 0 refills | Status: DC

## 2017-11-16 MED ORDER — ATORVASTATIN CALCIUM 80 MG PO TABS: 80.0000 mg | ORAL_TABLET | Freq: Every day | ORAL | 1 refills | Status: DC

## 2017-11-16 NOTE — Telephone Encounter (Signed)
Patient's wife is requesting that a 1-2 week prescription for Atorvastatin be sent to the [facility] in [facility] and a 3 month-supply be sent into [facility]. This is a medication that the patient's previous provider prescribed for him, and the patient is completely out of meds. Please advise patient.

## 2017-11-16 NOTE — Telephone Encounter (Signed)
Sent!

## 2017-11-16 NOTE — Telephone Encounter (Signed)
Left VM with status update.  

## 2017-11-20 ENCOUNTER — Encounter: Payer: Self-pay | Admitting: Family Medicine

## 2017-11-20 ENCOUNTER — Ambulatory Visit (INDEPENDENT_AMBULATORY_CARE_PROVIDER_SITE_OTHER): Payer: Medicare Other | Admitting: Family Medicine

## 2017-11-20 VITALS — BP 135/65 | HR 88 | Wt 155.0 lb

## 2017-11-20 DIAGNOSIS — M75111 Incomplete rotator cuff tear or rupture of right shoulder, not specified as traumatic: Secondary | ICD-10-CM | POA: Diagnosis not present

## 2017-11-20 DIAGNOSIS — S43431A Superior glenoid labrum lesion of right shoulder, initial encounter: Secondary | ICD-10-CM | POA: Diagnosis not present

## 2017-11-20 DIAGNOSIS — M7521 Bicipital tendinitis, right shoulder: Secondary | ICD-10-CM
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
## 2017-11-20 NOTE — Patient Instructions (Addendum)
Thank you for coming in today. Follow up with Orthopedic surgery.  Let me know if you do not hear anything from surgery.  Let me know what they say.   I expect they will recommend surgery.     Rotator Cuff Injury Rotator cuff injury is any type of injury to the set of muscles and tendons that make up the stabilizing unit of your shoulder. This unit holds the ball of your upper arm bone (humerus) in the socket of your shoulder blade (scapula). What are the causes? Injuries to your rotator cuff most commonly come from sports or activities that cause your arm to be moved repeatedly over your head. Examples of this include throwing, weight lifting, swimming, or racquet sports. Long lasting (chronic) irritation of your rotator cuff can cause soreness and swelling (inflammation), bursitis, and eventual damage to your tendons, such as a tear (rupture). What are the signs or symptoms? Acute rotator cuff tear:  Sudden tearing sensation followed by severe pain shooting from your upper shoulder down your arm toward your elbow.  Decreased range of motion of your shoulder because of pain and muscle spasm.  Severe pain.  Inability to raise your arm out to the side because of pain and loss of muscle power (large tears).  Chronic rotator cuff tear:  Pain that usually is worse at night and may interfere with sleep.  Gradual weakness and decreased shoulder motion as the pain worsens.  Decreased range of motion.  Rotator cuff tendinitis:  Deep ache in your shoulder and the outside upper arm over your shoulder.  Pain that comes on gradually and becomes worse when lifting your arm to the side or turning it inward.  How is this diagnosed? Rotator cuff injury is diagnosed through a medical history, physical exam, and imaging exam. The medical history helps determine the type of rotator cuff injury. Your health care provider will look at your injured shoulder, feel the injured area, and ask you to move  your shoulder in different positions. X-ray exams typically are done to rule out other causes of shoulder pain, such as fractures. MRI is the exam of choice for the most severe shoulder injuries because the images show muscles and tendons. How is this treated? Chronic tear:  Medicine for pain, such as acetaminophen or ibuprofen.  Physical therapy and range-of-motion exercises may be helpful in maintaining shoulder function and strength.  Steroid injections into your shoulder joint.  Surgical repair of the rotator cuff if the injury does not heal with noninvasive treatment.  Acute tear:  Anti-inflammatory medicines such as ibuprofen and naproxen to help reduce pain and swelling.  A sling to help support your arm and rest your rotator cuff muscles. Long-term use of a sling is not advised. It may cause significant stiffening of the shoulder joint.  Surgery may be considered within a few weeks, especially in younger, active people, to return the shoulder to full function.  Indications for surgical treatment include the following: ? Age younger than 35 years. ? Rotator cuff tears that are complete. ? Physical therapy, rest, and anti-inflammatory medicines have been used for 6-8 weeks, with no improvement. ? Employment or sporting activity that requires constant shoulder use.  Tendinitis:  Anti-inflammatory medicines such as ibuprofen and naproxen to help reduce pain and swelling.  A sling to help support your arm and rest your rotator cuff muscles. Long-term use of a sling is not advised. It may cause significant stiffening of the shoulder joint.  Severe tendinitis may  require: ? Steroid injections into your shoulder joint. ? Physical therapy. ? Surgery.  Follow these instructions at home:  Apply ice to your injury: ? Put ice in a plastic bag. ? Place a towel between your skin and the bag. ? Leave the ice on for 20 minutes, 2-3 times a day.  If you have a shoulder immobilizer  (sling and straps), wear it until told otherwise by your health care provider.  You may want to sleep on several pillows or in a recliner at night to lessen swelling and pain.  Only take over-the-counter or prescription medicines for pain, discomfort, or fever as directed by your health care provider.  Do simple hand squeezing exercises with a soft rubber ball to decrease hand swelling. Contact a health care provider if:  Your shoulder pain increases, or new pain or numbness develops in your arm, hand, or fingers.  Your hand or fingers are colder than your other hand. Get help right away if:  Your arm, hand, or fingers are numb or tingling.  Your arm, hand, or fingers are increasingly swollen and painful, or they turn white or blue. This information is not intended to replace advice given to you by your health care provider. Make sure you discuss any questions you have with your health care provider. Document Released: 01/18/2000 Document Revised: 06/28/2015 Document Reviewed: 09/01/2012 Elsevier Interactive Patient Education  2018 [employer].

## 2017-11-25 ENCOUNTER — Other Ambulatory Visit: Payer: Self-pay | Admitting: Physician Assistant

## 2017-11-25 DIAGNOSIS — N183 Chronic kidney disease, stage 3 unspecified: Secondary | ICD-10-CM

## 2017-11-25 DIAGNOSIS — I10 Essential (primary) hypertension: Secondary | ICD-10-CM

## 2017-11-27 ENCOUNTER — Telehealth: Payer: Self-pay

## 2017-11-27 NOTE — Telephone Encounter (Signed)
   [facility] Pre-operative Risk Assessment    Request for surgical clearance:  1. What type of surgery is being performed? RIGHT SHOULDER SCOPE-ROTATOR CUFF REPAIN   2. When is this surgery scheduled? TBD   3. What type of clearance is required (medical clearance vs. Pharmacy clearance to hold med vs. Both)? MEDICAL  4. Are there any medications that need to be held prior to surgery and how long?NOT LISTED   5. Practice name and name of physician performing surgery? [facility]   6. What is your office phone number [phone] [place]    7.   What is your office fax number [phone]  8.   Anesthesia type (None, local, MAC, general) ? NOT LISTED   Waylan Rocher 11/27/2017, 10:35 AM  _________________________________________________________________   (provider comments below)

## 2017-11-27 NOTE — Telephone Encounter (Signed)
   Primary Cardiologist: Kirk Ruths, MD  Chart reviewed as part of pre-operative protocol coverage.  Dr. [name], you saw the patient on 09/08/17 and held [facility] for GI bleed. Plan to discuss further/resume Eliquis after office visit with you 12/18/17. Should we wait for clearance unit office visit? Please forward your response to P [facility] DIV PREOP.   Thank you  Leanor Kail, PA 11/27/2017, 11:58 AM

## 2017-11-29 NOTE — Telephone Encounter (Signed)
Ok for surgery [name] [name]  

## 2017-11-30 NOTE — Telephone Encounter (Signed)
Spoke with wife. She said that patient his having "more TIA's. He stairs at things and having difficulties with speaking". This is worsen than baseline. Prior hx of TIA. He is off eliquis. Advised to discuss with PCP/neurology or seek evaluation in ER. No cardiac symptoms.  Will route Dr. [name] to review.

## 2017-11-30 NOTE — Telephone Encounter (Signed)
Can resume apixaban; would delay surgery; needs paov; agree with ER eval [name] [name]

## 2017-11-30 NOTE — Telephone Encounter (Signed)
Please let patient know to seek emergent medical attention for worsening "TIA" symptoms.  Let surgeon know to delay surgery.  Please make office visit with APP later this week >> resume eliquis if doing well.  Forward chart to PCP as well.

## 2017-11-30 NOTE — Telephone Encounter (Signed)
New message    Patient wife is returning call about the surgery clearance

## 2017-11-30 NOTE — Telephone Encounter (Signed)
Spoke with pt's wife re: message about TIA's.  She was letting us know that this is nothing new on the pt re: TIA's and pt has been to the ER several times for this.  It's nothing new she stated.  She said that pt is not having any symptoms today and doesn't feel the need to take him to the ED.  She was just letting us know his history.  1) Is pt cleared or does he still need an appt?  Its a little confusing since it was a little misunderstanding and miscommunication.   Please advise!

## 2017-11-30 NOTE — Telephone Encounter (Signed)
LMOVM TO CONTACT CLINIC BACK FOR PROVIDER RECOMMENDATIONS

## 2017-11-30 NOTE — Telephone Encounter (Signed)
Covering staff, Please let patient/wife know that Dr. [name] has cleared patient for surgery. I have already fax clearance letter to requesting provider via Epic. Please close encounter afterwards.

## 2017-11-30 NOTE — Telephone Encounter (Signed)
Will need fu ov with me or pa prior to surgery Kirk Ruths

## 2017-11-30 NOTE — Telephone Encounter (Signed)
SPOKE WITH PT WIFE WHO IS CONCERNED WITH HER HUSBAND FREQUENT  TIA ?? EPISODES IS HE STILL CONSIDERED CLEARED FOR SURGERY.

## 2017-12-01 NOTE — Telephone Encounter (Signed)
Will address pre-op clearance during office visit with Dr. [name] 12/18/17.   I will route this recommendation to the requesting party via Epic fax function and remove from pre-op pool.  Please call with questions.  [place], [place] 12/01/2017, 1:58 PM

## 2017-12-02 ENCOUNTER — Telehealth: Payer: Self-pay | Admitting: Family Medicine

## 2017-12-02 NOTE — Telephone Encounter (Signed)
Received notes from orthopedic surgery regarding consultation for right shoulder rotator cuff tear and biceps tendinitis.  Plan for arthroscopic right shoulder distal clavicle excision, subacromial decompression, rotator cuff repair involving the subscapularis and possibly biceps tenodesis.  Surgeon is Dr. Ophelia Charter  Note will be sent to scan.

## 2017-12-04 ENCOUNTER — Encounter: Payer: Self-pay | Admitting: Physician Assistant

## 2017-12-04 ENCOUNTER — Ambulatory Visit (INDEPENDENT_AMBULATORY_CARE_PROVIDER_SITE_OTHER): Payer: Medicare Other | Admitting: Physician Assistant

## 2017-12-04 VITALS — BP 164/79 | HR 88 | Temp 98.4°F | Wt 158.0 lb

## 2017-12-04 DIAGNOSIS — D638 Anemia in other chronic diseases classified elsewhere: Secondary | ICD-10-CM | POA: Diagnosis not present

## 2017-12-04 DIAGNOSIS — K5521 Angiodysplasia of colon with hemorrhage: Secondary | ICD-10-CM

## 2017-12-04 DIAGNOSIS — R29898 Other symptoms and signs involving the musculoskeletal system: Secondary | ICD-10-CM | POA: Diagnosis not present

## 2017-12-04 DIAGNOSIS — Z8673 Personal history of transient ischemic attack (TIA), and cerebral infarction without residual deficits: Secondary | ICD-10-CM | POA: Diagnosis not present

## 2017-12-04 DIAGNOSIS — R471 Dysarthria and anarthria: Secondary | ICD-10-CM

## 2017-12-04 DIAGNOSIS — E1169 Type 2 diabetes mellitus with other specified complication: Secondary | ICD-10-CM | POA: Diagnosis not present

## 2017-12-04 DIAGNOSIS — E1121 Type 2 diabetes mellitus with diabetic nephropathy: Secondary | ICD-10-CM | POA: Diagnosis not present

## 2017-12-04 DIAGNOSIS — N183 Chronic kidney disease, stage 3 unspecified: Secondary | ICD-10-CM

## 2017-12-04 DIAGNOSIS — Z8719 Personal history of other diseases of the digestive system: Secondary | ICD-10-CM

## 2017-12-04 DIAGNOSIS — E785 Hyperlipidemia, unspecified: Secondary | ICD-10-CM | POA: Diagnosis not present

## 2017-12-04 DIAGNOSIS — Z01818 Encounter for other preprocedural examination: Secondary | ICD-10-CM

## 2017-12-04 DIAGNOSIS — I639 Cerebral infarction, unspecified: Secondary | ICD-10-CM

## 2017-12-04 DIAGNOSIS — G459 Transient cerebral ischemic attack, unspecified: Secondary | ICD-10-CM

## 2017-12-04 HISTORY — DX: Cerebral infarction, unspecified: I63.9

## 2017-12-04 NOTE — Patient Instructions (Signed)
Transient Ischemic Attack A transient ischemic attack (TIA) is a "warning stroke" that causes stroke-like symptoms that then go away quickly. The symptoms of a TIA come on suddenly, and they last less than 24 hours. Unlike a stroke, a TIA does not cause permanent damage to the brain. It is important to know the symptoms of a TIA and what to do. Seek medical care right away, even if your symptoms go away. Having a TIA is a sign that you are at higher risk for a permanent stroke. Lifestyle changes and medical treatments can help prevent a stroke. What are the causes? This condition is caused by a temporary blockage in an artery in the head or neck. The blockage does not allow the brain to get the blood supply it needs and can cause various symptoms. The blockage can be caused by:  Fatty buildup in an artery in the head or neck (atherosclerosis).  A blood clot.  Tearing of an artery (dissection).  Inflammation of an artery (vasculitis).  Sometimes the cause is not known. What increases the risk? Certain factors may make you more likely to develop this condition. Some of these factors are things that you can change, such as:  Obesity.  Using products that contain nicotine or tobacco, such as cigarettes and e-cigarettes.  Taking oral birth control, especially if you also use tobacco.  Lack of physical inactivity.  Excessive use of alcohol.  Use of drugs, especially cocaine and methamphetamine.  Other risk factors include:  High blood pressure (hypertension).  High cholesterol.  Diabetes mellitus.  Heart disease (coronary artery disease).  Atrial fibrillation.  Being African American or Hispanic.  Being over the age of 60.  Being male.  Family history of stroke.  Previous history of blood clots, stroke, TIA, or heart attack.  Sickle cell disease.  Being a woman with a history of preeclampsia.  Migraine headache.  Sleep apnea.  Chronic inflammatory diseases, such  as rheumatoid arthritis or lupus.  Blood clotting disorders (hypercoagulable state).  What are the signs or symptoms? Symptoms of this condition are the same as those of a stroke, but they are temporary. The symptoms develop suddenly, and they go away quickly, usually within minutes to hours. Symptoms may include sudden:  Weakness or numbness in your face, arm, or leg, especially on one side of your body.  Trouble walking or difficulty moving your arms or legs.  Trouble speaking, understanding speech, or both (aphasia).  Vision changes in one or both eyes. These include double vision, blurred vision, or loss of vision.  Dizziness.  Confusion.  Loss of balance or coordination.  Nausea and vomiting.  Severe headache with no known cause.  If possible, make note of the exact time that you last felt like your normal self and what time your symptoms started. Tell your health care provider. How is this diagnosed? This condition may be diagnosed based on:  Your symptoms and medical history.  A physical exam.  Imaging tests, usually a CT or MRI scan of the brain.  Blood tests.  You may also have other tests, including:  Electrocardiogram (ECG).  Echocardiogram.  Carotid ultrasound.  A scan of the brain circulation (CT angiogram or MRI angiogram).  Continuous heart monitoring.  How is this treated? The goal of treatment is to reduce the risk for a subsequent stroke. Treatment may include stroke prevention therapies such as:  Changes to diet or lifestyle to decrease your risk. Lifestyle changes may include exercising and stopping smoking.    Medicines to thin the blood (antiplatelets or anticoagulants).  Blood pressure medicines.  Medicines to reduce cholesterol.  Treating other health conditions, such as diabetes or atrial fibrillation.  If testing shows that you have narrowing in the arteries to your brain, your health care provider may recommend a procedure, such  as:  Carotid endarterectomy. This is a surgery to remove the blockage from your artery.  Carotid angioplasty and stenting. This is a procedure to open or widen an artery in the neck using a metal mesh tube (stent). The stent helps keep the artery open by supporting the artery walls.  Follow these instructions at home: Medicines  Take over-the-counter and prescription medicines only as told by your health care provider.  If you were told to take a medicine to thin your blood, such as aspirin or an anticoagulant, take it exactly as told by your health care provider. ? Taking too much blood-thinning medicine can cause bleeding. ? If you do not take enough blood-thinning medicine, you will not have the protection that you need against a stroke and other problems. Eating and drinking  Eat 5 or more servings of fruits and vegetables each day.  Follow instructions from your health care provider about diet. You may need to follow a certain nutrition plan to help manage risk factors for stroke, such as high blood pressure, high cholesterol, diabetes, or obesity. This may include: ? Eating a low-fat, low-salt diet. ? Including a lot of fiber in your diet. ? Limiting the amount of carbohydrates and sugar in your diet.  Limit alcohol intake to no more than 1 drink a day for nonpregnant women and 2 drinks a day for men. One drink equals 12 oz of beer, 5 oz of wine, or 1 oz of hard liquor. General instructions  Maintain a healthy weight.  Stay physically active. Try to get at least 30 minutes of exercise on most or all days.  Find out if you have sleep apnea, and seek treatment if needed.  Do not use any products that contain nicotine or tobacco, such as cigarettes and e-cigarettes. If you need help quitting, ask your health care provider.  Do not abuse drugs.  Keep all follow-up visits as told by your health care provider. This is important. Where to find more information:  American Stroke  Association: www.strokeassociation.org  National Stroke Association: www.stroke.org Get help right away if:  You have chest pain or an irregular heartbeat.  You have any symptoms of stroke. The acronym BEFAST is an easy way to remember the main warning signs of stroke. ? B = Balance problems. Signs include dizziness, sudden trouble walking, or loss of balance. ? E = Eye problems. This includes trouble seeing or a sudden change in vision. ? F = Face changes. This includes sudden weakness or numbness of the face, or the face or eyelid drooping to one side. ? A = Arm weakness or numbness. This happens suddenly and usually on one side of the body. ? S = Speech problems. This includes trouble speaking or trouble understanding speech. ? T = Time. Time to call 911 or seek emergency care. Do not wait to see if symptoms will go away. Make note of the time your symptoms started.  Other signs of stroke may include: ? A sudden, severe headache with no known cause. ? Nausea or vomiting. ? Seizure. These symptoms may represent a serious problem that is an emergency. Do not wait to see if the symptoms will go away. Get   medical help right away. Call your local emergency services (911 in the [place] Do not drive yourself to the hospital. Summary  A TIA happens when an artery in the head or neck is blocked, leading to stroke-like symptoms that then go away quickly. The blockage clears before there is any permanent brain damage. A TIA is a medical emergency and requires immediate medical attention.  Symptoms of this condition are the same as those of a stroke, but they are temporary. The symptoms usually develop suddenly, and they go away quickly, usually within minutes to hours.  Having a TIA means that you are at high risk of a stroke in the near future.  Treatment may include medicines to thin the blood as well as medicines, diet changes, and lifestyle changes to manage conditions that increase the risk  of another TIA or a stroke. This information is not intended to replace advice given to you by your health care provider. Make sure you discuss any questions you have with your health care provider. Document Released: 10/30/2004 Document Revised: 03/04/2016 Document Reviewed: 03/04/2016 Elsevier Interactive Patient Education  2018 Elsevier Inc.  
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
## 2017-12-07 ENCOUNTER — Encounter: Payer: Self-pay | Admitting: Gastroenterology

## 2017-12-07 DIAGNOSIS — E1169 Type 2 diabetes mellitus with other specified complication: Secondary | ICD-10-CM | POA: Diagnosis not present

## 2017-12-07 DIAGNOSIS — E1121 Type 2 diabetes mellitus with diabetic nephropathy: Secondary | ICD-10-CM | POA: Diagnosis not present

## 2017-12-07 DIAGNOSIS — E785 Hyperlipidemia, unspecified: Secondary | ICD-10-CM | POA: Diagnosis not present

## 2017-12-07 DIAGNOSIS — D649 Anemia, unspecified: Secondary | ICD-10-CM | POA: Diagnosis not present

## 2017-12-08 ENCOUNTER — Telehealth: Payer: Self-pay

## 2017-12-08 ENCOUNTER — Encounter: Payer: Self-pay | Admitting: Physician Assistant

## 2017-12-08 ENCOUNTER — Ambulatory Visit (HOSPITAL_BASED_OUTPATIENT_CLINIC_OR_DEPARTMENT_OTHER)
Admission: RE | Admit: 2017-12-08 | Discharge: 2017-12-08 | Disposition: A | Discharge status: Home / Self Care | Payer: Medicare Other | Source: Ambulatory Visit | Attending: Physician Assistant | Admitting: Physician Assistant

## 2017-12-08 ENCOUNTER — Other Ambulatory Visit: Payer: Self-pay | Admitting: Osteopathic Medicine

## 2017-12-08 DIAGNOSIS — R471 Dysarthria and anarthria: Secondary | ICD-10-CM | POA: Insufficient documentation

## 2017-12-08 DIAGNOSIS — R29898 Other symptoms and signs involving the musculoskeletal system: Secondary | ICD-10-CM | POA: Insufficient documentation

## 2017-12-08 DIAGNOSIS — Z8673 Personal history of transient ischemic attack (TIA), and cerebral infarction without residual deficits: Secondary | ICD-10-CM | POA: Diagnosis not present

## 2017-12-08 DIAGNOSIS — I6523 Occlusion and stenosis of bilateral carotid arteries: Secondary | ICD-10-CM | POA: Diagnosis not present
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
## 2017-12-08 NOTE — Telephone Encounter (Signed)
-----   Message from Loralie Champagne, PA-C sent at 12/08/2017  4:09 PM EST ----- Please let the patient know that I received a note from his endocrinologist stating that his hemoglobin was found to be 8.4 g last week.  This is down from when I saw him in August at which time it had improved to 9.8 g.  Anyway, can you please contact him and see if he has had any evidence of GI bleeding with black or bloody stools?  Please see what he says, but then we may need to do either stool Hemoccults or set up an office visit to discuss and see if any further GI evaluation is needed.  He had issues with bleeding AVMs back in the summer.  Also, confirm to see if he is on any blood thinners currently.  Thank you,  Jess

## 2017-12-08 NOTE — Telephone Encounter (Signed)
Please review for refill- patient at [facility] 

## 2017-12-09 ENCOUNTER — Other Ambulatory Visit: Payer: Self-pay | Admitting: Physician Assistant

## 2017-12-09 ENCOUNTER — Encounter: Payer: Self-pay | Admitting: Physician Assistant

## 2017-12-09 DIAGNOSIS — I6523 Occlusion and stenosis of bilateral carotid arteries: Secondary | ICD-10-CM

## 2017-12-09 MED ORDER — ASPIRIN 325 MG PO TABS: 325.0000 mg | ORAL_TABLET | Freq: Every day | ORAL | Status: DC

## 2017-12-09 NOTE — Telephone Encounter (Signed)
[name] [name] the pt's wife had already called and scheduled an appt with you for Friday. She was at work and unable to discuss.

## 2017-12-11 ENCOUNTER — Other Ambulatory Visit (INDEPENDENT_AMBULATORY_CARE_PROVIDER_SITE_OTHER): Payer: Medicare Other

## 2017-12-11 ENCOUNTER — Encounter: Payer: Self-pay | Admitting: Gastroenterology

## 2017-12-11 ENCOUNTER — Ambulatory Visit: Payer: Medicare Other | Admitting: Gastroenterology

## 2017-12-11 VITALS — BP 122/60 | HR 64 | Ht 64.0 in | Wt 154.0 lb

## 2017-12-11 DIAGNOSIS — D649 Anemia, unspecified: Secondary | ICD-10-CM

## 2017-12-11 DIAGNOSIS — Z8719 Personal history of other diseases of the digestive system: Secondary | ICD-10-CM | POA: Diagnosis not present

## 2017-12-11 DIAGNOSIS — K5520 Angiodysplasia of colon without hemorrhage: Secondary | ICD-10-CM | POA: Diagnosis not present

## 2017-12-11 LAB — IBC PANEL
IRON: 75 ug/dL (ref 42–165)
Saturation Ratios: 19.4 % — ABNORMAL LOW (ref 20.0–50.0)
Transferrin: 276 mg/dL (ref 212.0–360.0)

## 2017-12-11 LAB — FERRITIN: Ferritin: 39.7 ng/mL (ref 22.0–322.0)
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
## 2017-12-11 NOTE — Patient Instructions (Signed)
If you are age 77 or older, your body mass index should be between 23-30. Your Body mass index is 26.43 kg/m. If this is out of the aforementioned range listed, please consider follow up with your Primary Care Provider.  If you are age 67 or younger, your body mass index should be between 19-25. Your Body mass index is 26.43 kg/m. If this is out of the aformentioned range listed, please consider follow up with your Primary Care Provider.   Your provider has requested that you go to the basement level for lab work before leaving today. Press "B" on the elevator. The lab is located at the first door on the left as you exit the elevator.  You have been given hemoccult cards - please return to [facility].  Thank you for choosing me and [facility].   Alonza Bogus, PA-C

## 2017-12-12 ENCOUNTER — Encounter (HOSPITAL_BASED_OUTPATIENT_CLINIC_OR_DEPARTMENT_OTHER): Payer: Self-pay

## 2017-12-12 ENCOUNTER — Ambulatory Visit (HOSPITAL_BASED_OUTPATIENT_CLINIC_OR_DEPARTMENT_OTHER)
Admission: RE | Admit: 2017-12-12 | Discharge: 2017-12-12 | Disposition: A | Discharge status: Home / Self Care | Payer: Medicare Other | Source: Ambulatory Visit | Attending: Physician Assistant | Admitting: Physician Assistant

## 2017-12-12 DIAGNOSIS — I608 Other nontraumatic subarachnoid hemorrhage: Secondary | ICD-10-CM

## 2017-12-12 DIAGNOSIS — Z8673 Personal history of transient ischemic attack (TIA), and cerebral infarction without residual deficits: Secondary | ICD-10-CM | POA: Diagnosis not present

## 2017-12-12 DIAGNOSIS — Z7984 Long term (current) use of oral hypoglycemic drugs: Secondary | ICD-10-CM | POA: Diagnosis not present

## 2017-12-12 DIAGNOSIS — I63512 Cerebral infarction due to unspecified occlusion or stenosis of left middle cerebral artery: Secondary | ICD-10-CM | POA: Diagnosis not present

## 2017-12-12 DIAGNOSIS — I2510 Atherosclerotic heart disease of native coronary artery without angina pectoris: Secondary | ICD-10-CM | POA: Diagnosis not present

## 2017-12-12 DIAGNOSIS — K219 Gastro-esophageal reflux disease without esophagitis: Secondary | ICD-10-CM | POA: Diagnosis not present

## 2017-12-12 DIAGNOSIS — E1151 Type 2 diabetes mellitus with diabetic peripheral angiopathy without gangrene: Secondary | ICD-10-CM | POA: Diagnosis not present

## 2017-12-12 DIAGNOSIS — R29898 Other symptoms and signs involving the musculoskeletal system: Secondary | ICD-10-CM

## 2017-12-12 DIAGNOSIS — G319 Degenerative disease of nervous system, unspecified: Secondary | ICD-10-CM | POA: Insufficient documentation

## 2017-12-12 DIAGNOSIS — Z880 Allergy status to penicillin: Secondary | ICD-10-CM | POA: Diagnosis not present

## 2017-12-12 DIAGNOSIS — R471 Dysarthria and anarthria: Secondary | ICD-10-CM

## 2017-12-12 DIAGNOSIS — H409 Unspecified glaucoma: Secondary | ICD-10-CM | POA: Diagnosis not present

## 2017-12-12 DIAGNOSIS — Z87891 Personal history of nicotine dependence: Secondary | ICD-10-CM | POA: Diagnosis not present

## 2017-12-12 DIAGNOSIS — E7800 Pure hypercholesterolemia, unspecified: Secondary | ICD-10-CM | POA: Diagnosis not present

## 2017-12-12 DIAGNOSIS — I739 Peripheral vascular disease, unspecified: Secondary | ICD-10-CM

## 2017-12-12 DIAGNOSIS — I5032 Chronic diastolic (congestive) heart failure: Secondary | ICD-10-CM | POA: Diagnosis not present

## 2017-12-12 DIAGNOSIS — Z79899 Other long term (current) drug therapy: Secondary | ICD-10-CM | POA: Diagnosis not present

## 2017-12-12 DIAGNOSIS — E1122 Type 2 diabetes mellitus with diabetic chronic kidney disease: Secondary | ICD-10-CM | POA: Diagnosis not present

## 2017-12-12 DIAGNOSIS — I130 Hypertensive heart and chronic kidney disease with heart failure and stage 1 through stage 4 chronic kidney disease, or unspecified chronic kidney disease: Secondary | ICD-10-CM | POA: Diagnosis not present

## 2017-12-12 DIAGNOSIS — R4781 Slurred speech: Secondary | ICD-10-CM | POA: Diagnosis not present

## 2017-12-12 DIAGNOSIS — I480 Paroxysmal atrial fibrillation: Secondary | ICD-10-CM | POA: Diagnosis not present

## 2017-12-12 DIAGNOSIS — Z7982 Long term (current) use of aspirin: Secondary | ICD-10-CM | POA: Diagnosis not present

## 2017-12-12 DIAGNOSIS — Z91013 Allergy to seafood: Secondary | ICD-10-CM | POA: Diagnosis not present

## 2017-12-12 DIAGNOSIS — R251 Tremor, unspecified: Secondary | ICD-10-CM | POA: Diagnosis not present

## 2017-12-12 DIAGNOSIS — Z888 Allergy status to other drugs, medicaments and biological substances status: Secondary | ICD-10-CM | POA: Diagnosis not present

## 2017-12-12 DIAGNOSIS — Z8774 Personal history of (corrected) congenital malformations of heart and circulatory system: Secondary | ICD-10-CM | POA: Diagnosis not present

## 2017-12-12 DIAGNOSIS — M6281 Muscle weakness (generalized): Secondary | ICD-10-CM | POA: Diagnosis not present

## 2017-12-12 DIAGNOSIS — I63412 Cerebral infarction due to embolism of left middle cerebral artery: Secondary | ICD-10-CM | POA: Diagnosis not present

## 2017-12-12 DIAGNOSIS — N183 Chronic kidney disease, stage 3 (moderate): Secondary | ICD-10-CM | POA: Diagnosis not present

## 2017-12-13 ENCOUNTER — Encounter: Payer: Self-pay | Admitting: Physician Assistant

## 2017-12-13 DIAGNOSIS — I63512 Cerebral infarction due to unspecified occlusion or stenosis of left middle cerebral artery: Secondary | ICD-10-CM | POA: Insufficient documentation

## 2017-12-14 ENCOUNTER — Encounter: Payer: Self-pay | Admitting: Physician Assistant

## 2017-12-14 ENCOUNTER — Other Ambulatory Visit: Payer: Self-pay | Admitting: Physician Assistant

## 2017-12-14 DIAGNOSIS — I63512 Cerebral infarction due to unspecified occlusion or stenosis of left middle cerebral artery: Secondary | ICD-10-CM

## 2017-12-14 NOTE — Progress Notes (Signed)
Reviewed and agree with documentation and assessment and plan. K. Veena  , MD   

## 2017-12-15 ENCOUNTER — Telehealth: Payer: Self-pay

## 2017-12-15 ENCOUNTER — Telehealth: Payer: Self-pay | Admitting: Neurology

## 2017-12-15 ENCOUNTER — Other Ambulatory Visit: Payer: Self-pay

## 2017-12-15 ENCOUNTER — Encounter (HOSPITAL_COMMUNITY): Payer: Self-pay | Admitting: *Deleted

## 2017-12-15 ENCOUNTER — Inpatient Hospital Stay (HOSPITAL_COMMUNITY)
Admission: EM | Admit: 2017-12-15 | Discharge: 2017-12-16 | DRG: 065 | Disposition: A | Discharge status: Home / Self Care | Payer: Medicare Other | Attending: Internal Medicine | Admitting: Internal Medicine

## 2017-12-15 ENCOUNTER — Telehealth: Payer: Self-pay | Admitting: Physician Assistant

## 2017-12-15 DIAGNOSIS — Z79899 Other long term (current) drug therapy: Secondary | ICD-10-CM | POA: Diagnosis not present

## 2017-12-15 DIAGNOSIS — Z888 Allergy status to other drugs, medicaments and biological substances status: Secondary | ICD-10-CM

## 2017-12-15 DIAGNOSIS — I130 Hypertensive heart and chronic kidney disease with heart failure and stage 1 through stage 4 chronic kidney disease, or unspecified chronic kidney disease: Secondary | ICD-10-CM | POA: Diagnosis present

## 2017-12-15 DIAGNOSIS — R251 Tremor, unspecified: Secondary | ICD-10-CM | POA: Diagnosis present

## 2017-12-15 DIAGNOSIS — Z880 Allergy status to penicillin: Secondary | ICD-10-CM

## 2017-12-15 DIAGNOSIS — K219 Gastro-esophageal reflux disease without esophagitis: Secondary | ICD-10-CM | POA: Diagnosis present

## 2017-12-15 DIAGNOSIS — N183 Chronic kidney disease, stage 3 (moderate): Secondary | ICD-10-CM | POA: Diagnosis present

## 2017-12-15 DIAGNOSIS — M6281 Muscle weakness (generalized): Secondary | ICD-10-CM | POA: Diagnosis not present

## 2017-12-15 DIAGNOSIS — E1151 Type 2 diabetes mellitus with diabetic peripheral angiopathy without gangrene: Secondary | ICD-10-CM | POA: Diagnosis present

## 2017-12-15 DIAGNOSIS — I480 Paroxysmal atrial fibrillation: Secondary | ICD-10-CM | POA: Diagnosis present

## 2017-12-15 DIAGNOSIS — Z7984 Long term (current) use of oral hypoglycemic drugs: Secondary | ICD-10-CM

## 2017-12-15 DIAGNOSIS — I63512 Cerebral infarction due to unspecified occlusion or stenosis of left middle cerebral artery: Secondary | ICD-10-CM

## 2017-12-15 DIAGNOSIS — I639 Cerebral infarction, unspecified: Secondary | ICD-10-CM

## 2017-12-15 DIAGNOSIS — R29898 Other symptoms and signs involving the musculoskeletal system: Secondary | ICD-10-CM

## 2017-12-15 DIAGNOSIS — I2510 Atherosclerotic heart disease of native coronary artery without angina pectoris: Secondary | ICD-10-CM | POA: Diagnosis present

## 2017-12-15 DIAGNOSIS — E1122 Type 2 diabetes mellitus with diabetic chronic kidney disease: Secondary | ICD-10-CM | POA: Diagnosis present

## 2017-12-15 DIAGNOSIS — F419 Anxiety disorder, unspecified: Secondary | ICD-10-CM | POA: Diagnosis present

## 2017-12-15 DIAGNOSIS — Z8774 Personal history of (corrected) congenital malformations of heart and circulatory system: Secondary | ICD-10-CM

## 2017-12-15 DIAGNOSIS — E1121 Type 2 diabetes mellitus with diabetic nephropathy: Secondary | ICD-10-CM | POA: Diagnosis present

## 2017-12-15 DIAGNOSIS — R4781 Slurred speech: Secondary | ICD-10-CM | POA: Diagnosis present

## 2017-12-15 DIAGNOSIS — I5032 Chronic diastolic (congestive) heart failure: Secondary | ICD-10-CM | POA: Diagnosis present

## 2017-12-15 DIAGNOSIS — E7800 Pure hypercholesterolemia, unspecified: Secondary | ICD-10-CM | POA: Diagnosis present

## 2017-12-15 DIAGNOSIS — Z87891 Personal history of nicotine dependence: Secondary | ICD-10-CM

## 2017-12-15 DIAGNOSIS — I63412 Cerebral infarction due to embolism of left middle cerebral artery: Principal | ICD-10-CM | POA: Diagnosis present

## 2017-12-15 DIAGNOSIS — H409 Unspecified glaucoma: Secondary | ICD-10-CM | POA: Diagnosis present

## 2017-12-15 DIAGNOSIS — Z91013 Allergy to seafood: Secondary | ICD-10-CM | POA: Diagnosis not present

## 2017-12-15 DIAGNOSIS — Z8679 Personal history of other diseases of the circulatory system: Secondary | ICD-10-CM | POA: Diagnosis not present

## 2017-12-15 DIAGNOSIS — Z8719 Personal history of other diseases of the digestive system: Secondary | ICD-10-CM | POA: Diagnosis not present

## 2017-12-15 DIAGNOSIS — I10 Essential (primary) hypertension: Secondary | ICD-10-CM | POA: Diagnosis not present

## 2017-12-15 DIAGNOSIS — Z8673 Personal history of transient ischemic attack (TIA), and cerebral infarction without residual deficits: Secondary | ICD-10-CM

## 2017-12-15 DIAGNOSIS — Z9889 Other specified postprocedural states: Secondary | ICD-10-CM

## 2017-12-15 DIAGNOSIS — Z7982 Long term (current) use of aspirin: Secondary | ICD-10-CM

## 2017-12-15 DIAGNOSIS — I361 Nonrheumatic tricuspid (valve) insufficiency: Secondary | ICD-10-CM | POA: Diagnosis not present

## 2017-12-15 HISTORY — DX: Chronic kidney disease, stage 3 (moderate): N18.3

## 2017-12-15 HISTORY — DX: Chronic diastolic (congestive) heart failure: I50.32

## 2017-12-15 HISTORY — DX: Disorder of arteries and arterioles, unspecified: I77.9

## 2017-12-15 HISTORY — DX: Chronic kidney disease, stage 3 unspecified: N18.30

## 2017-12-15 HISTORY — DX: Atherosclerotic heart disease of native coronary artery without angina pectoris: I25.10

## 2017-12-15 HISTORY — DX: Cerebral infarction, unspecified: I63.9

## 2017-12-15 HISTORY — DX: Paroxysmal atrial fibrillation: I48.0

## 2017-12-15 HISTORY — DX: Patent foramen ovale: Q21.12

## 2017-12-15 HISTORY — DX: Atrial septal defect: Q21.1

## 2017-12-15 HISTORY — DX: Pulmonary hypertension, unspecified: I27.20

## 2017-12-15 HISTORY — DX: Peripheral vascular disease, unspecified: I73.9

## 2017-12-15 LAB — COMPREHENSIVE METABOLIC PANEL
ALT: 18 U/L (ref 0–44)
AST: 26 U/L (ref 15–41)
Albumin: 4.2 g/dL (ref 3.5–5.0)
Alkaline Phosphatase: 110 U/L (ref 38–126)
Anion gap: 8 (ref 5–15)
BUN: 29 mg/dL — ABNORMAL HIGH (ref 8–23)
CHLORIDE: 101 mmol/L (ref 98–111)
CO2: 24 mmol/L (ref 22–32)
Calcium: 9.5 mg/dL (ref 8.9–10.3)
Creatinine, Ser: 1.8 mg/dL — ABNORMAL HIGH (ref 0.61–1.24)
GFR calc Af Amer: 40 mL/min — ABNORMAL LOW (ref >60)
GFR, EST NON AFRICAN AMERICAN: 35 mL/min — AB (ref >60)
Glucose, Bld: 96 mg/dL (ref 70–99)
Potassium: 4.8 mmol/L (ref 3.5–5.1)
Sodium: 133 mmol/L — ABNORMAL LOW (ref 135–145)
Total Bilirubin: 0.6 mg/dL (ref 0.3–1.2)
Total Protein: 7.6 g/dL (ref 6.5–8.1)

## 2017-12-15 LAB — URINALYSIS, ROUTINE W REFLEX MICROSCOPIC
Bacteria, UA: NONE SEEN
Bilirubin Urine: NEGATIVE
GLUCOSE, UA: NEGATIVE mg/dL
Hgb urine dipstick: NEGATIVE
Ketones, ur: 5 mg/dL — AB
Leukocytes, UA: NEGATIVE
Nitrite: NEGATIVE
PROTEIN: 30 mg/dL — AB
Specific Gravity, Urine: 1.013 (ref 1.005–1.030)
pH: 5 (ref 5.0–8.0)

## 2017-12-15 LAB — CBC
HCT: 30.6 % — ABNORMAL LOW (ref 39.0–52.0)
Hemoglobin: 9.5 g/dL — ABNORMAL LOW (ref 13.0–17.0)
MCH: 30 pg (ref 26.0–34.0)
MCHC: 31 g/dL (ref 30.0–36.0)
MCV: 96.5 fL (ref 80.0–100.0)
PLATELETS: 333 10*3/uL (ref 150–400)
RBC: 3.17 MIL/uL — AB (ref 4.22–5.81)
RDW: 13.1 % (ref 11.5–15.5)
WBC: 6.8 10*3/uL (ref 4.0–10.5)
nRBC: 0 % (ref 0.0–0.2)

## 2017-12-15 LAB — I-STAT TROPONIN, ED: TROPONIN I, POC: 0 ng/mL (ref 0.00–0.08)

## 2017-12-15 LAB — RAPID URINE DRUG SCREEN, HOSP PERFORMED
Amphetamines: NOT DETECTED
BENZODIAZEPINES: POSITIVE — AB
Barbiturates: NOT DETECTED
COCAINE: NOT DETECTED
Opiates: NOT DETECTED
Tetrahydrocannabinol: NOT DETECTED

## 2017-12-15 LAB — DIFFERENTIAL
ABS IMMATURE GRANULOCYTES: 0.03 10*3/uL (ref 0.00–0.07)
BASOS PCT: 1 %
Basophils Absolute: 0.1 10*3/uL (ref 0.0–0.1)
EOS ABS: 0.3 10*3/uL (ref 0.0–0.5)
Eosinophils Relative: 4 %
Immature Granulocytes: 0 %
Lymphocytes Relative: 15 %
Lymphs Abs: 1 10*3/uL (ref 0.7–4.0)
MONOS PCT: 10 %
Monocytes Absolute: 0.7 10*3/uL (ref 0.1–1.0)
NEUTROS PCT: 70 %
Neutro Abs: 4.7 10*3/uL (ref 1.7–7.7)

## 2017-12-15 LAB — PROTIME-INR
INR: 0.93
Prothrombin Time: 12.3 seconds (ref 11.4–15.2)

## 2017-12-15 LAB — ETHANOL: Alcohol, Ethyl (B): <10 mg/dL (ref <10)

## 2017-12-15 LAB — APTT: APTT: 32 s (ref 24–36)

## 2017-12-15 MED ORDER — SERTRALINE HCL 100 MG PO TABS
100.0000 mg | ORAL_TABLET | Freq: Every day | ORAL | Status: DC
  Administered 2017-12-16: 100 mg via ORAL
  Filled 2017-12-15 (×2): qty 1

## 2017-12-15 MED ORDER — STROKE: EARLY STAGES OF RECOVERY BOOK
Freq: Once | Status: AC
  Administered 2017-12-16: 01:00:00
  Filled 2017-12-15 (×2): qty 1

## 2017-12-15 MED ORDER — ACETAMINOPHEN 325 MG PO TABS: 650.0000 mg | ORAL_TABLET | ORAL | Status: DC | PRN

## 2017-12-15 MED ORDER — ATORVASTATIN CALCIUM 80 MG PO TABS
80.0000 mg | ORAL_TABLET | Freq: Every day | ORAL | Status: DC
  Administered 2017-12-16: 80 mg via ORAL
  Filled 2017-12-15 (×2): qty 1

## 2017-12-15 MED ORDER — IRBESARTAN 150 MG PO TABS
75.0000 mg | ORAL_TABLET | Freq: Every day | ORAL | Status: DC
  Filled 2017-12-15: qty 1

## 2017-12-15 MED ORDER — ACETAMINOPHEN 160 MG/5ML PO SOLN: 650.0000 mg | ORAL | Status: DC | PRN

## 2017-12-15 MED ORDER — ALPRAZOLAM 0.25 MG PO TABS
0.2500 mg | ORAL_TABLET | Freq: Every day | ORAL | Status: DC
  Administered 2017-12-16: 0.25 mg via ORAL
  Filled 2017-12-15: qty 1

## 2017-12-15 MED ORDER — ACETAMINOPHEN 650 MG RE SUPP: 650.0000 mg | RECTAL | Status: DC | PRN

## 2017-12-15 MED ORDER — INSULIN ASPART 100 UNIT/ML ~~LOC~~ SOLN
0.0000 [IU] | Freq: Three times a day (TID) | SUBCUTANEOUS | Status: DC
  Administered 2017-12-16: 2 [IU] via SUBCUTANEOUS

## 2017-12-15 MED ORDER — TRAMADOL HCL 50 MG PO TABS
50.0000 mg | ORAL_TABLET | Freq: Four times a day (QID) | ORAL | Status: DC | PRN
  Administered 2017-12-16: 50 mg via ORAL
  Filled 2017-12-15: qty 1

## 2017-12-15 MED ORDER — SENNOSIDES-DOCUSATE SODIUM 8.6-50 MG PO TABS: 1.0000 | ORAL_TABLET | Freq: Every evening | ORAL | Status: DC | PRN

## 2017-12-15 NOTE — Telephone Encounter (Signed)
Received call from Dr Lynnette Caffey with [facility]. He reviewed the Urgent referral we sent over and recommends the Pt have inpatient eval due to the acute stroke and hemorrhage. He cannot view the images from the scan since he is in a different network, but based on the report he would like the Pt seen urgently inpatient. He is OK to follow patient for Neuro upon discharge, but feels the outpatient eval would not be the safest next step.  Will route to PCP and speak with her personally to determine next step.

## 2017-12-15 NOTE — ED Notes (Signed)
Spoke w/ MD Leonel Ramsay regarding pt's wait time to see hospitalist. MD informed this RN that he was receiving 2 strokes simultaneously at this time, and would be in to speak with the family afterwards. Family updated

## 2017-12-15 NOTE — Telephone Encounter (Signed)
Spoke with pt wife. Mrs. Simenson sts that the pt is currently in the Ed for evaluation. The Neurologist came in to the pt room during our phone call.  Didn't get a chance to give her Dr.Crenshaw's recommendation, adv her I would update Dr.Crenshaw and call back if indicated.

## 2017-12-15 NOTE — ED Provider Notes (Signed)
She has had intermittent "twitching" of his left hand and numbness in his left hand for many months.  He developed numbness in his right hand and twitching in his right hand last week.  He had an outpatient MRI scan, with MRA of the brain on 12/12/2017 cystoscopy with subacute embolic left MCA infarct.  Also had carotid ultrasound showing less than 50% stenosis in right and left internal carotid arteries.  He was sent here today by his PCP for further evaluation and evaluation by neurologist.  Dr. Leonel Ramsay evaluated patient in the ED and recommends overnight stay and echocardiogram   Orlie Dakin, MD 12/15/17 [id number]

## 2017-12-15 NOTE — ED Notes (Signed)
Per PA Elizabeth,pt may eat and drink. Pt passed stroke swallow screen, see documentation. Pt provided w/ bag lunch and diet coke. Family updated as to wait time. Offered comfort measures to both wife and pt. Deny additional needs at this time.

## 2017-12-15 NOTE — Telephone Encounter (Signed)
Called to give pt MRI results and Dr.Crenshaw' recommendation. Lmtcb.  Discussed with Dr. Leonie Man. He reviewed recent MRI. Given that there is some bleeding he recommended repeat CT scan in 1 week. If blood is isodense we can then begin Eliquis at that time. We will arrange CT scan and patient will follow-up with me.  Kirk Ruths, MD

## 2017-12-15 NOTE — Telephone Encounter (Signed)
-----   Message from Lelon Perla, MD sent at 12/15/2017  1:04 PM EST ----- Discussed with Dr. Leonie Man.  He reviewed recent MRI.  Given that there is some bleeding he recommended repeat CT scan in 1 week.  If blood is isodense we can then begin Eliquis at that time.  We will arrange CT scan and patient will follow-up with me. Kirk Ruths, MD  ----- Message ----- From: Trixie Dredge, [place] Sent: 12/14/2017   9:01 AM EST To: Lelon Perla, MD  Good morning Bill, I understand that you spoke to Dr. Raliegh Scarlet on Friday regarding your MRI results. Unfortunately your MRI did show a subacute stroke which is the cause of your symptoms.  We will not be able to clear you for surgery at this time.  I see that you have already scheduled an appointment with a neurologist, so I will not place an additional referral for you. I will defer to Neurology as far as your surgical clearance.  I anticipate it will be at least 6 months before you can be cleared. I am also routing this result to your cardiologist because your atrial fibrillation is likely the source of your strokes.  And I anticipate they will want you to have a conversation with you about going back on your Eliquis.  For now continue your aspirin and current medications. Please contact me if you have any questions or concerns. Evlyn Clines

## 2017-12-15 NOTE — Telephone Encounter (Signed)
Working on this. I am currently contacting the Neuro on call at [facility] for consult
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
## 2017-12-15 NOTE — ED Triage Notes (Signed)
Pt in sent by PCP for MRI results that showed a stroke completed last Saturday  Morning, pt notified today to come in for stroke, pt reports stopping taking Eliquis x 1-2 wks ago, pt states, "I stopped taking it myself and then I started bruising." pts wife reports trembling of the L arm, pt A&O x4
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
## 2017-12-15 NOTE — ED Notes (Signed)
Per wife the pt has intermittent episodes where he cannot speak , and he could not walk and reprots dizziness with a blank look onset since surgery in February 2019, pt wife reports the pt was told he was having TIA, per wife the pt was taken off of Eliquis Dr. [name] 6/19 pt reports not taking medication, pt and wife disagreeing re: medical history

## 2017-12-15 NOTE — ED Notes (Signed)
Dr. Leonel Ramsay Neurology at bedside.
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
## 2017-12-15 NOTE — ED Notes (Signed)
Patient is resting comfortably. 

## 2017-12-15 NOTE — Telephone Encounter (Signed)
This patient has had a 2-week history of episodes of left-sided weakness.  The episodes are coming going on a daily basis.  He does have a history of atrial fibrillation and diabetes.  MRI of the brain showed a small cortical subacute infarct in the posterior right frontal area, he does have hemosiderin deposits in the left frontal cortex as well.  Given the fact that the patient is neurologically unstable, he is having frequent TIA-like events on a daily basis, I would recommend hospitalization for further evaluation of the cerebrovascular circulation.

## 2017-12-16 ENCOUNTER — Inpatient Hospital Stay (HOSPITAL_COMMUNITY): Payer: Medicare Other

## 2017-12-16 ENCOUNTER — Encounter (HOSPITAL_COMMUNITY): Payer: Self-pay | Admitting: Physician Assistant

## 2017-12-16 ENCOUNTER — Other Ambulatory Visit: Payer: Self-pay

## 2017-12-16 DIAGNOSIS — I639 Cerebral infarction, unspecified: Secondary | ICD-10-CM

## 2017-12-16 DIAGNOSIS — Z9889 Other specified postprocedural states: Secondary | ICD-10-CM

## 2017-12-16 DIAGNOSIS — Z8719 Personal history of other diseases of the digestive system: Secondary | ICD-10-CM

## 2017-12-16 DIAGNOSIS — E1121 Type 2 diabetes mellitus with diabetic nephropathy: Secondary | ICD-10-CM

## 2017-12-16 DIAGNOSIS — I361 Nonrheumatic tricuspid (valve) insufficiency: Secondary | ICD-10-CM

## 2017-12-16 DIAGNOSIS — Z8679 Personal history of other diseases of the circulatory system: Secondary | ICD-10-CM

## 2017-12-16 DIAGNOSIS — I10 Essential (primary) hypertension: Secondary | ICD-10-CM

## 2017-12-16 LAB — HEMOGLOBIN A1C
Hgb A1c MFr Bld: 6.1 % — ABNORMAL HIGH (ref 4.8–5.6)
MEAN PLASMA GLUCOSE: 128.37 mg/dL

## 2017-12-16 LAB — BASIC METABOLIC PANEL
ANION GAP: 7 (ref 5–15)
BUN: 28 mg/dL — ABNORMAL HIGH (ref 8–23)
CALCIUM: 9.1 mg/dL (ref 8.9–10.3)
CHLORIDE: 104 mmol/L (ref 98–111)
CO2: 23 mmol/L (ref 22–32)
Creatinine, Ser: 1.65 mg/dL — ABNORMAL HIGH (ref 0.61–1.24)
GFR calc non Af Amer: 38 mL/min — ABNORMAL LOW (ref >60)
GFR, EST AFRICAN AMERICAN: 45 mL/min — AB (ref >60)
Glucose, Bld: 92 mg/dL (ref 70–99)
Potassium: 4.7 mmol/L (ref 3.5–5.1)
Sodium: 134 mmol/L — ABNORMAL LOW (ref 135–145)

## 2017-12-16 LAB — LIPID PANEL
CHOL/HDL RATIO: 2.5 ratio
CHOLESTEROL: 113 mg/dL (ref 0–200)
HDL: 45 mg/dL (ref >40)
LDL Cholesterol: 47 mg/dL (ref 0–99)
Triglycerides: 106 mg/dL (ref <150)
VLDL: 21 mg/dL (ref 0–40)

## 2017-12-16 LAB — ECHOCARDIOGRAM COMPLETE
Height: 64 in
WEIGHTICAEL: 2400 [oz_av]

## 2017-12-16 LAB — GLUCOSE, CAPILLARY
GLUCOSE-CAPILLARY: 120 mg/dL — AB (ref 70–99)
Glucose-Capillary: 92 mg/dL (ref 70–99)
Glucose-Capillary: 181 mg/dL — ABNORMAL HIGH (ref 70–99)

## 2017-12-16 MED ORDER — SERTRALINE HCL 100 MG PO TABS: 100.0000 mg | ORAL_TABLET | Freq: Every day | ORAL | Status: DC

## 2017-12-16 MED ORDER — APIXABAN 5 MG PO TABS: 5.0000 mg | ORAL_TABLET | Freq: Two times a day (BID) | ORAL | 0 refills | Status: DC

## 2017-12-16 MED ORDER — ALPRAZOLAM 0.25 MG PO TABS: 0.2500 mg | ORAL_TABLET | Freq: Every day | ORAL | Status: DC

## 2017-12-16 MED ORDER — ASPIRIN EC 81 MG PO TBEC: 81.0000 mg | DELAYED_RELEASE_TABLET | Freq: Every day | ORAL | 0 refills | Status: DC

## 2017-12-16 NOTE — Progress Notes (Signed)
NURSING PROGRESS NOTE  [name] [name] [id number] Discharge Data: 12/16/2017 6:23 PM Attending Provider: Caren Griffins, MD DHR:[id number], Elson Areas, PA-C     [name] [name] to be D/C'd Home   per MD order.  Discussed with the patient the After Visit Summary and all questions fully answered. All IV's discontinued with no bleeding noted. All belongings returned to patient for patient to take home.   Last Vital Signs:  Blood pressure 135/84, pulse 100, temperature 97.8 F (36.6 C), temperature source Oral, resp. rate 18, height 5\' 4"  (1.626 m), weight 68 kg, SpO2 100 %.  Discharge Medication List Allergies as of 12/16/2017      Reactions   Penicillins Anaphylaxis   Has patient had a PCN reaction causing immediate rash, facial/tongue/throat swelling, SOB or lightheadedness with hypotension: yes Has patient had a PCN reaction causing severe rash involving mucus membranes or skin necrosis: no Has patient had a PCN reaction that required hospitalization: no Has patient had a PCN reaction occurring within the last 10 years: no If all of the above answers are "NO", then may proceed with Cephalosporin use.   Shellfish Allergy Anaphylaxis   Amiodarone Nausea And Vomiting   Fenofibrate Other (See Comments)   "Dr told pt to discontinue med"      Medication List    STOP taking these medications   aspirin 325 MG tablet Replaced by:  aspirin EC 81 MG tablet     TAKE these medications   ALPRAZolam 0.25 MG tablet Commonly known as:  XANAX Take 0.25 mg by mouth daily.   apixaban 5 MG Tabs tablet Commonly known as:  ELIQUIS Take 1 tablet (5 mg total) by mouth 2 (two) times daily.   aspirin EC 81 MG tablet Take 1 tablet (81 mg total) by mouth daily. Replaces:  aspirin 325 MG tablet   atorvastatin 80 MG tablet Commonly known as:  LIPITOR Take 1 tablet (80 mg total) by mouth daily.   BILBERRY PO Take 1 capsule by mouth daily.   diclofenac sodium 1 %  Gel Commonly known as:  VOLTAREN Apply 4 g topically 4 (four) times daily. To affected joint.   diphenhydramine-acetaminophen 25-500 MG Tabs tablet Commonly known as:  TYLENOL PM Take 1 tablet by mouth at bedtime.   furosemide 40 MG tablet Commonly known as:  LASIX Take 0.5 tablets (20 mg total) by mouth as needed for edema.   irbesartan 75 MG tablet Commonly known as:  AVAPRO TAKE 1 TABLET BY MOUTH  DAILY   Melatonin 5 MG Caps Take 1 capsule by mouth at bedtime.   metFORMIN 500 MG tablet Commonly known as:  GLUCOPHAGE Take 1 tablet (500 mg total) by mouth 2 (two) times daily with a meal.   ondansetron 4 MG tablet Commonly known as:  ZOFRAN Take 1 tablet (4 mg total) by mouth every 8 (eight) hours as needed for nausea or vomiting.   sertraline 50 MG tablet Commonly known as:  ZOLOFT Take 100 mg by mouth daily.   traMADol 50 MG tablet Commonly known as:  ULTRAM Take 50 mg by mouth every 6 (six) hours as needed for moderate pain.

## 2017-12-16 NOTE — Evaluation (Signed)
Occupational Therapy Evaluation Patient Details Name: [name] [name] MRN: [id number] DOB: 11-02-1940 Today's Date: 12/16/2017    History of Present Illness Pt is a 77 y.o male with PMH consisting of A fib, CHFm, DM, HTN, TIA, and dsynpnea at rest presents to the ED with L UE weakness, numbness and slurred speech. This had become progressivly worse over the past 2 weeks. 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    Clinical Impression   PTA patient independent.  Currently admitted for above and limited by problem list below.  Presents with difficulty sequencing and problem solving through dual cognitive tasks, and decreased short term memory; which his spouse endorses has become worse since CVA and pt masks cognitive symptoms throughout session.  Patient currently requires supervision for mobility and ADLs. Patient will benefit from continued OT services while admitted but anticipate no further needs after dc.  Will continue to follow.     Follow Up Recommendations  No OT follow up    Equipment Recommendations  None recommended by OT    Recommendations for Other Services       Precautions / Restrictions Precautions Precautions: None Restrictions Weight Bearing Restrictions: No      Mobility Bed Mobility Overal bed mobility: Independent             General bed mobility comments: Pt presented standing OOB  Transfers Overall transfer level: Needs assistance Equipment used: None Transfers: Sit to/from Stand Sit to Stand: Supervision         General transfer comment: supervision for safety    Balance Overall balance assessment: Needs assistance Sitting-balance support: Feet supported Sitting balance-Leahy Scale: Good Sitting balance - Comments: Pt able to tolerate sitting balance without additional assistance    Standing balance support: No upper extremity supported;During  functional activity Standing balance-Leahy Scale: Fair Standing balance comment: supervision for safety                            ADL either performed or assessed with clinical judgement   ADL Overall ADL's : Needs assistance/impaired     Grooming: Supervision/safety;Standing   Upper Body Bathing: Supervision/ safety;Sitting   Lower Body Bathing: Supervison/ safety;Sit to/from stand   Upper Body Dressing : Set up;Sitting   Lower Body Dressing: Set up;Sit to/from stand   Toilet Transfer: Supervision/safety;Ambulation   Toileting- Clothing Manipulation and Hygiene: Modified independent;Sit to/from stand       Functional mobility during ADLs: Supervision/safety General ADL Comments: pt at or near baseline for ADLs, requires cueing for safety and limited by cognition      Vision Baseline Vision/History: Wears glasses Wears Glasses: At all times Patient Visual Report: No change from baseline Vision Assessment?: No apparent visual deficits Additional Comments: tracking, visual fields [facility]     Perception Perception Perception Tested?: No   Praxis      Pertinent Vitals/Pain Pain Assessment: No/denies pain     Hand Dominance Left   Extremity/Trunk Assessment Upper Extremity Assessment Upper Extremity Assessment: Overall WFL for tasks assessed(reports R shoulder pain with planned surgery in future)   Lower Extremity Assessment Lower Extremity Assessment: Defer to PT evaluation   Cervical / Trunk Assessment Cervical / Trunk Assessment: Normal   Communication Communication Communication: No difficulties   Cognition Arousal/Alertness: Awake/alert Behavior During Therapy: WFL for tasks assessed/performed Overall Cognitive Status: Impaired/Different from baseline Area of Impairment: Safety/judgement;Awareness;Problem solving;Attention;Memory  Current Attention Level: Selective Memory: Decreased short-term memory    Safety/Judgement: Decreased awareness of safety;Decreased awareness of deficits Awareness: Emergent Problem Solving: Requires verbal cues;Difficulty sequencing General Comments: Patient masks cognitive deficits but demonstrates difficulty with dual cognitive tasks, sequencing, and memory.  Spouse present at completion of session and reports cognition has been declining but is worse since CVA.    General Comments  spouse present at completion of session    Exercises     Shoulder Instructions      Home Living Family/patient expects to be discharged to:: Private residence Living Arrangements: Spouse/significant other Available Help at Discharge: Family;Available PRN/intermittently Type of Home: House Home Access: Stairs to enter [employer] of Steps: 6 Entrance Stairs-Rails: Right Home Layout: Bed/bath upstairs;Multi-level Alternate Level Stairs-Number of Steps: 20 Alternate Level Stairs-Rails: Can reach both Bathroom Shower/Tub: Occupational psychologist: Standard Bathroom Accessibility: No   Home Equipment: Cane - single point;Shower seat - built in   Additional Comments: does not currently use AD  Lives With: Spouse    Prior Functioning/Environment Level of Independence: Independent                 OT Problem List: Decreased activity tolerance;Decreased cognition;Decreased safety awareness      OT Treatment/Interventions: Self-care/ADL training;Balance training;Patient/family education;Cognitive remediation/compensation;Therapeutic activities    OT Goals(Current goals can be found in the care plan section) Acute Rehab OT Goals Patient Stated Goal: to return home  OT Goal Formulation: With patient Time For Goal Achievement: 12/30/17 Potential to Achieve Goals: Good  OT Frequency: Min 2X/week   Barriers to D/C:            Co-evaluation              AM-PAC PT "6 Clicks" Daily Activity     Outcome Measure Help from another person  eating meals?: None Help from another person taking care of personal grooming?: None Help from another person toileting, which includes using toliet, bedpan, or urinal?: None Help from another person bathing (including washing, rinsing, drying)?: None Help from another person to put on and taking off regular upper body clothing?: None Help from another person to put on and taking off regular lower body clothing?: None 6 Click Score: 24   End of Session Nurse Communication: Mobility status  Activity Tolerance: Patient tolerated treatment well Patient left: in bed;with call bell/phone within reach;with family/visitor present  OT Visit Diagnosis: Unsteadiness on feet (R26.81);Other symptoms and signs involving cognitive function                Time: 2694-8546 OT Time Calculation (min): 16 min Charges:  OT General Charges $OT Visit: 1 Visit OT Evaluation $OT Eval Moderate Complexity: [place], OT Acute Rehabilitation Services Pager [phone] Office [phone]   Delight Stare 12/16/2017, 5:01 PM

## 2017-12-16 NOTE — Telephone Encounter (Signed)
Pt is currently admitted. CT of the head repeated today. Update fwd to Dr.Crenshaw.

## 2017-12-16 NOTE — Care Management Note (Signed)
Case Management Note  Patient Details  Name: Audry Pecina MRN: [id number] Date of Birth: 06-10-1940  Subjective/Objective:     Patient admitted with a stroke. He is from home with his spouse. Spouse is able to provide assistance but works.  DME: rails, walker, shower chair, 3 in 1 Pt denies issues with obtaining his medications. Pt has no issues with transportation.               Action/Plan: Awaiting PT/OT evals. CM following for d/c needs, physician issues.  Expected Discharge Date:  12/18/17               Expected Discharge Plan:     In-House Referral:     Discharge planning Services     Post Acute Care Choice:    Choice offered to:     DME Arranged:    DME Agency:     HH Arranged:    HH Agency:     Status of Service:  In process, will continue to follow  If discussed at Long Length of Stay Meetings, dates discussed:    Additional Comments:  Pollie Friar, RN 12/16/2017, 3:22 PM

## 2017-12-16 NOTE — Discharge Instructions (Signed)
Follow with Trixie Dredge, PA-C in 5-7 days  Please get a complete blood count and chemistry panel checked by your Primary MD at your next visit, and again as instructed by your Primary MD. Please get your medications reviewed and adjusted by your Primary MD.  Please request your Primary MD to go over all Hospital Tests and Procedure/Radiological results at the follow up, please get all Hospital records sent to your Prim MD by signing hospital release before you go home.  If you had Pneumonia of Lung problems at the Hospital: Please get a 2 view Chest X ray done in 6-8 weeks after hospital discharge or sooner if instructed by your Primary MD.  If you have Congestive Heart Failure: Please call your Cardiologist or Primary MD anytime you have any of the following symptoms:  1) 3 pound weight gain in 24 hours or 5 pounds in 1 week  2) shortness of breath, with or without a dry hacking cough  3) swelling in the hands, feet or stomach  4) if you have to sleep on extra pillows at night in order to breathe  Follow cardiac low salt diet and 1.5 lit/day fluid restriction.  If you have diabetes Accuchecks 4 times/day, Once in AM empty stomach and then before each meal. Log in all results and show them to your primary doctor at your next visit. If any glucose reading is under 80 or above 300 call your primary MD immediately.  If you have Seizure/Convulsions/Epilepsy: Please do not drive, operate heavy machinery, participate in activities at heights or participate in high speed sports until you have seen by Primary MD or a Neurologist and advised to do so again.  If you had Gastrointestinal Bleeding: Please ask your Primary MD to check a complete blood count within one week of discharge or at your next visit. Your endoscopic/colonoscopic biopsies that are pending at the time of discharge, will also need to followed by your Primary MD.  Get Medicines reviewed and adjusted. Please take  all your medications with you for your next visit with your Primary MD  Please request your Primary MD to go over all hospital tests and procedure/radiological results at the follow up, please ask your Primary MD to get all Hospital records sent to his/her office.  If you experience worsening of your admission symptoms, develop shortness of breath, life threatening emergency, suicidal or homicidal thoughts you must seek medical attention immediately by calling 911 or calling your MD immediately  if symptoms less severe.  You must read complete instructions/literature along with all the possible adverse reactions/side effects for all the Medicines you take and that have been prescribed to you. Take any new Medicines after you have completely understood and accpet all the possible adverse reactions/side effects.   Do not drive or operate heavy machinery when taking Pain medications.   Do not take more than prescribed Pain, Sleep and Anxiety Medications  Special Instructions: If you have smoked or chewed Tobacco  in the last 2 yrs please stop smoking, stop any regular Alcohol  and or any Recreational drug use.  Wear Seat belts while driving.  Please note You were cared for by a hospitalist during your hospital stay. If you have any questions about your discharge medications or the care you received while you were in the hospital after you are discharged, you can call the unit and asked to speak with the hospitalist on call if the hospitalist that took care of you is not available.  Once you are discharged, your primary care physician will handle any further medical issues. Please note that NO REFILLS for any discharge medications will be authorized once you are discharged, as it is imperative that you return to your primary care physician (or establish a relationship with a primary care physician if you do not have one) for your aftercare needs so that they can reassess your need for medications and  monitor your lab values.  You can reach the hospitalist office at phone [phone] or fax [phone]   If you do not have a primary care physician, you can call [phone] for a physician referral.  Activity: As tolerated with Full fall precautions use walker/cane & assistance as needed  Diet: heart healthy  Disposition Home

## 2017-12-16 NOTE — Progress Notes (Addendum)
STROKE TEAM PROGRESS NOTE  HPI:( Dr Leonel Ramsay ) [name] [name] is a 77 y.o. male with a history of atrial fibrillation, CHF, diabetes who presents with couple of weeks of transient hand weakness and numbness.  He has a history of atrial fibrillation, but stopped taking the medication after being admitted with hematochezia in July.Apparently the worst of the symptoms was about a week and a half ago at which point he was actually slurring his speech and having some difficulty with his arm.  This is improved, but he continues to intermittently have hand numbness. He was referred for an outpatient MRI which shows an acute ischemic infarct with small sulcal subarachnoid hemorrhage LKW: Unclear, at least a week and a half tpa given?: no, outside of window  INTERVAL HISTORY Patient stated that he is doing fine now has had no recurrent symptoms.he is ready for discharge home. F/u Ct head shows no evidence of bleed  Vitals:   12/16/17 0036 12/16/17 0255 12/16/17 0835 12/16/17 1143  BP: (!) 172/90 (!) 101/52 137/72 127/74  Pulse: 99 90 99 92  Resp: 19 18 18 18   Temp: 98.6 F (37 C) 98.4 F (36.9 C) 98.2 F (36.8 C) 98.5 F (36.9 C)  TempSrc: Oral Oral Oral Oral  SpO2: 100% 99% 99% 100%  Weight:      Height:        CBC:  Recent Labs  Lab 12/15/17 1526  WBC 6.8  NEUTROABS 4.7  HGB 9.5*  HCT 30.6*  MCV 96.5  PLT 416    Basic Metabolic Panel:  Recent Labs  Lab 12/15/17 1526 12/16/17 0350  NA 133* 134*  K 4.8 4.7  CL 101 104  CO2 24 23  GLUCOSE 96 92  BUN 29* 28*  CREATININE 1.80* 1.65*  CALCIUM 9.5 9.1   Lipid Panel:     Component Value Date/Time   CHOL 113 12/16/2017 0350   TRIG 106 12/16/2017 0350   HDL 45 12/16/2017 0350   CHOLHDL 2.5 12/16/2017 0350   VLDL 21 12/16/2017 0350   LDLCALC 47 12/16/2017 0350   LDLCALC 68 09/25/2017 1205   HgbA1c:  Lab Results  Component Value Date   HGBA1C 6.1 (H) 12/16/2017   Urine Drug Screen:     Component Value  Date/Time   LABOPIA NONE DETECTED 12/15/2017 1526   COCAINSCRNUR NONE DETECTED 12/15/2017 1526   LABBENZ POSITIVE (A) 12/15/2017 1526   AMPHETMU NONE DETECTED 12/15/2017 1526   THCU NONE DETECTED 12/15/2017 1526   LABBARB NONE DETECTED 12/15/2017 1526    Alcohol Level     Component Value Date/Time   ETH <10 12/15/2017 1526   EEG Normal  IMAGING Ct Head Wo Contrast  Result Date: 12/16/2017 CLINICAL DATA:  77 year old male with punctate right superior frontal lobe infarcts on MRI 12/12/2017. Small focus of suspected subarachnoid hemorrhage at the left superior frontal convexity. Left upper extremity weakness at that time. EXAM: CT HEAD WITHOUT CONTRAST TECHNIQUE: Contiguous axial images were obtained from the base of the skull through the vertex without intravenous contrast. COMPARISON:  Brain MRI 12/12/2017. Head CT 02/25/2014. FINDINGS: Brain: The left superior frontal sulci appear normal now. Subtle if any CT changes in the right superior frontal lobe corresponding to the recent MRI findings. Gray-white matter differentiation is within normal limits for age. No midline shift, ventriculomegaly, mass effect, evidence of mass lesion, intracranial hemorrhage or evidence of cortically based acute infarction. Vascular: Calcified atherosclerosis at the skull base. No suspicious intracranial vascular hyperdensity. Skull: Negative.  Sinuses/Orbits: Visualized paranasal sinuses and mastoids are stable and well pneumatized. Other: Visualized orbits and scalp soft tissues are within normal limits. IMPRESSION: 1. The small volume left superior frontal convexity subarachnoid blood suspected on 12/22/2017 appears resolved and there are subtle if any CT changes at the site of the tiny the right superior frontal lobe infarcts. 2. No new intracranial abnormality. Electronically Signed   By: Genevie Ann M.D.   On: 12/16/2017 13:25    PHYSICAL EXAM  Pleasant elderly caucasian male not in distress. . Afebrile. Head  is nontraumatic. Neck is supple without bruit.    Cardiac exam no murmur or gallop. Lungs are clear to auscultation. Distal pulses are well felt. Neurological Exam ;  Awake  Alert oriented x 3. Normal speech and language.eye movements full without nystagmus.fundi were not visualized. Vision acuity and fields appear normal. Hearing is normal. Palatal movements are normal. Face symmetric. Tongue midline. Normal strength, tone, reflexes and coordination. Normal sensation. Gait deferred.   ASSESSMENT/PLAN [name] is a 77 y.o. male with history of atrial fibrillation not on anticoagulation following hematochezia in July 2019, CHF and diabetes presenting with transient hand weakness and numbness.   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  Outpatient MRI small right posterior frontal cortical and subcortical infarcts.  Small hemorrhage left frontal cortex with adjacent SAH likely subacute embolic infarct with hemorrhagic transformation.  Atrophy.  Small vessel disease.  Outpatient MRA unremarkable  Outpatient carotid Doppler less than 50% stenosis right and left ICA  CT head small volume left superior frontal convexity subarachnoid hemorrhage appears resolved, changes subtle if any.  No new normality  2D Echo  pending   EEG normal  LDL 68  HgbA1c 6.1  SCDs for VTE prophylaxis  aspirin 325 mg daily prior to admission, now on No antithrombotic.  Given resolved hemorrhage, okay to resume Eliquis for atrial fibrillation.  Dr. Leonie Man discussed with Dr. Cruzita Lederer  Therapy recommendations:  pending   Disposition: For discharge later today  Atrial Fibrillation  Home anticoagulation:  none.  Previously on Eliquis  Given hemorrhage resolution, okay to resume  Eliquis . Continue Eliquis (apixaban) daily at discharge   Hypertension  Stable . 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 Agree with note above.  He has presented with small bilateral subacute infarcts from his atrial fibrillation despite prior MAZE procedure and left atrial sewing. He is at high risk for further strokes and despite GI bleed in past  will need anticoagulation. I have discussed risk benefit with him and he is willing to restart eliquis. Recommend 5 mg twice dialy. Agree with cardiiology recommendation. D/w Dr Marlou Porch.I spent  25 minutes in total face-to-face time with the patient, more than 50% of which was spent in counseling and coordination of care, reviewing test results, reviewing medication and discussing or reviewing the diagnosis of   AFIB, strokes, bleeding risk on eliquis , the prognosis and treatment options.    Antony Contras, MD Medical Director [facility] Pager: [phone] 12/16/2017 6:14 PM  To contact Stroke Continuity provider, please refer to [email or web address] After hours,  contact General Neurology

## 2017-12-16 NOTE — Progress Notes (Signed)
  Echocardiogram 2D Echocardiogram has been performed.  Jannett Celestine 12/16/2017, 2:01 PM

## 2017-12-16 NOTE — Procedures (Signed)
ELECTROENCEPHALOGRAM REPORT   Patient: [name] [name]       Room #: 8Y64B EEG No. ID: [id number] Age: 77 y.o.        Sex: male Referring Physician: Cruzita Lederer Report Date:  12/16/2017        Interpreting Physician: Alexis Goodell  History: [name] [name] is an 77 y.o. male with acute infarct  Medications:  Xanax, Lipitor, Insulin, Avapro, Zoloft  Conditions of Recording:  This is a 21 channel routine scalp EEG performed with bipolar and monopolar montages arranged in accordance to the international 10/20 system of electrode placement. One channel was dedicated to EKG recording.  The patient is in the awake state.  Description:  The waking background activity consists of a low voltage, symmetrical, fairly well organized, 9 Hz alpha activity, seen from the parieto-occipital and posterior temporal regions.  Low voltage fast activity, poorly organized, is seen anteriorly and is at times superimposed on more posterior regions.  A mixture of theta and alpha rhythms are seen from the central and temporal regions. The patient does not drowse or sleep. No epileptiform activity is noted.   Hyperventilation and intermittent photic stimulation were not performed.   IMPRESSION: This is a normal awake electroencephalogram.  There are no focal lateralizing or epileptiform features.   Alexis Goodell, MD Neurology [phone] 12/16/2017, 12:24 PM

## 2017-12-16 NOTE — Evaluation (Signed)
Physical Therapy Evaluation Patient Details Name: [name] [name] MRN: [id number] DOB: [birth year] Today's Date: 12/16/2017   History of Present Illness  Pt is a 77 y.o male with PMH consisting of A fib, CHFm, DM, HTN, TIA, and dsynpnea at rest presents to the ED with L UE weakness, numbness and slurred speech. This had become progressivly worse over the past 2 weeks. 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 Prior to admission, pt was independent with all activities, including functional mobility and ADL's. Pt lives with his spouse, but does not have 24/7 support. Pt required overall min guard for transfers, ambulation, and stairs. Pt demonstrated L lateral drift during gait that was self corrected without bumping into objects. Pt shows decreased insights to deficits and insists that there are no lingering symptoms despite difficulty with higher level balance. Therefore, pt would benefit from outpatient PT. Pt would benefit from continued skilled PT in order to increase balance and address safety needs.           Follow Up Recommendations Outpatient PT     Equipment Recommendations  None recommended by PT    Recommendations for Other Services       Precautions / Restrictions Precautions Precautions: None Restrictions Weight Bearing Restrictions: No      Mobility  Bed Mobility Overal bed mobility: Independent             General bed mobility comments: Pt presented standing OOB  Transfers Overall transfer level: Needs assistance Equipment used: None Transfers: Sit to/from Stand Sit to Stand: Min guard         General transfer comment: pt required min guard for saftey. During sit to stand, pt stumbled to the L. requiring his LUE to brace on the bed rail. Pt reports that this has never happened before.       Ambulation/Gait Ambulation/Gait assistance: Min guard Gait Distance (Feet): 125 Feet Assistive device: None Gait Pattern/deviations: Decreased step length - left;Drifts right/left   Gait velocity interpretation: >2.62 ft/sec, indicative of community ambulatory General Gait Details: Pt requires min guard during ambulation for safety. Pt consistently drifts towards the L during gait, but did not result in bumping into any objects.   Stairs Stairs: Yes Stairs assistance: Min guard Stair Management: Two rails;Alternating pattern;Step to pattern Number of Stairs: 4(2x2) General stair comments:  Min guard given for safety with no loss of balance   Wheelchair Mobility    Modified Rankin (Stroke Patients Only) Modified Rankin (Stroke Patients Only) Pre-Morbid Rankin Score: No symptoms Modified Rankin: No significant disability     Balance Overall balance assessment: Needs assistance Sitting-balance support: Feet supported Sitting balance-Leahy Scale: Good Sitting balance - Comments: Pt able to tolerate sitting balance without additional assistance    Standing balance support: No upper extremity supported;During functional activity Standing balance-Leahy Scale: Fair Standing balance comment: pt demonstrated decreased standing balance during initial sit to stand with LUE support needed                              Pertinent Vitals/Pain Pain Assessment: No/denies pain    Home Living Family/patient expects to be discharged to:: Private residence Living Arrangements: Spouse/significant other Available Help at Discharge: Family;Available PRN/intermittently Type of Home: House Home Access: Stairs to enter Entrance Stairs-Rails: Right Entrance Stairs-Number  of Steps: 6 Home Layout: Bed/bath upstairs;Multi-level Home Equipment: Cane - single point Additional Comments: does not currently use AD    Prior Function Level of Independence: Independent                Hand Dominance   Dominant Hand: Left    Extremity/Trunk Assessment   Upper Extremity Assessment Upper Extremity Assessment: Defer to OT evaluation    Lower Extremity Assessment Lower Extremity Assessment: Overall WFL for tasks assessed    Cervical / Trunk Assessment Cervical / Trunk Assessment: Normal  Communication   Communication: No difficulties  Cognition Arousal/Alertness: Awake/alert Behavior During Therapy: WFL for tasks assessed/performed Overall Cognitive Status: Impaired/Different from baseline Area of Impairment: Safety/judgement;Awareness;Problem solving                         Safety/Judgement: Decreased awareness of safety;Decreased awareness of deficits Awareness: Emergent Problem Solving: Requires verbal cues;Requires tactile cues        General Comments      Exercises     Assessment/Plan    PT Assessment Patient needs continued PT services  PT Problem List Decreased strength;Decreased range of motion;Decreased activity tolerance;Decreased balance;Decreased coordination;Decreased mobility;Decreased safety awareness;Decreased knowledge of precautions       PT Treatment Interventions DME instruction;Gait training;Stair training;Functional mobility training;Therapeutic activities;Therapeutic exercise;Balance training;Neuromuscular re-education    PT Goals (Current goals can be found in the Care Plan section)  Acute Rehab PT Goals Patient Stated Goal: to return home  PT Goal Formulation: With patient Time For Goal Achievement: 12/30/17 Potential to Achieve Goals: Good    Frequency Min 4X/week   Barriers to discharge        Co-evaluation               AM-PAC PT "6 Clicks" Daily Activity  Outcome Measure Difficulty turning over in bed (including adjusting bedclothes, sheets and blankets)?: None Difficulty moving from lying on back to sitting on the side of the bed? : None Difficulty sitting down on and standing up from a  chair with arms (e.g., wheelchair, bedside commode, etc,.)?: A Little Help needed moving to and from a bed to chair (including a wheelchair)?: None Help needed walking in hospital room?: None Help needed climbing 3-5 steps with a railing? : None 6 Click Score: 23    End of Session Equipment Utilized During Treatment: Gait belt Activity Tolerance: Patient tolerated treatment well Patient left: in bed;with call bell/phone within reach Nurse Communication: Mobility status PT Visit Diagnosis: Unsteadiness on feet (R26.81);Other abnormalities of gait and mobility (R26.89)    Time: 3662-9476 PT Time Calculation (min) (ACUTE ONLY): 20 min   Charges:   PT Evaluation $PT Eval Moderate Complexity: 1 Mod          [name] [name], SPT Acute Rehab [phone] (pager) [phone] (office)  [name] [name] 12/16/2017, 4:49 PM
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
## 2017-12-16 NOTE — Discharge Summary (Addendum)
Physician Discharge Summary  Beverley Sherrard DXA:[id number] DOB: 1940-08-17 DOA: 12/15/2017  PCP: Trixie Dredge, PA-C  Admit date: 12/15/2017 Discharge date: 12/16/2017  Admitted From: home Disposition:  home  Recommendations for Outpatient Follow-up:  1. Follow up with PCP in 1-2 weeks 2. Follow-up with Dr. [name] as scheduled in 2 days 3. Follow-up with neurology in 6 weeks 4. Follow-up with gastroenterology as scheduled  Home Health: None Equipment/Devices: None  Discharge Condition: Stable CODE STATUS: Full code Diet recommendation: Heart healthy  HPI: Per Dr. Alcario Drought, [name] [name] is a 77 y.o. male with medical history significant of A.Fib s/p MAZE procedure, anticoagulants stopped in July post hematochezia, CHF, DM2, HTN.  Patient presents to the ED with a couple of weeks of transient hand weakness and numbness. Apparently the worst of the symptoms was about a week and a half ago at which point he was actually slurring his speech and having some difficulty with his L arm. This is improved, but he continues to intermittently have L hand numbness. He was referred for an outpatient MRI which shows an acute ischemic infarct with small sulcal subarachnoid hemorrhage.  Hospital Course: Patient was admitted to the hospital with acute CVA -MRI as an outpatient few days prior to admission showed small acute right posterior frontal cortical and subcortical infarcts, small focus of hemorrhage in the left frontal cortex. Neurology was consulted and followed patient while hospitalized.  He underwent a regular stroke work-up, bilateral carotids with less than 50% stenosis in the right and left internal carotid arteries, hemoglobin A1c 6.1, lipid panel shows an LDL of 47.  He is already on a statin.  Patient has a history of atrial fibrillation status post Maze procedure, and was on anticoagulation up until July 2018 when he had acute blood loss anemia in the  setting of a GI bleed.  He was evaluated by gastroenterology at that time status post polypectomy as well as was found to have angiectasia status post APC treatment.  Clinically he denies any bleeding since, no black stools, and he has been off of his anticoagulation and only on aspirin.  Repeat CT scan of the brain while hospitalized did not show any significant evidence of bleeding.  He was recently evaluated last week by gastroenterology and there were concern for ongoing bleed given a hemoglobin of 8.4 as an outpatient in his endocrinologist office.  His baseline hemoglobin is in the mid nines and during this hospital stay his hemoglobin was 9.5.  Discussed case with neurology as well as cardiology, currently he does not have any clinical evidence of an ongoing GI bleed and may benefit from resumption of anticoagulation at this point. Patient agreeable. Atrial fibrillation - patient's CHA2DS2-VASc Score for Stroke Risk is > 2, he would benefit from anticoagulation, discussed risks and benefits with the patient and his wife. Resume Eliquis Hypertension -resume home medications Diabetes mellitus -resume home medications Chronic kidney disease stage III -baseline creatinine 1.5-1.6, currently at baseline  Discharge Diagnoses:  Principal Problem:   Acute embolic stroke [facility]) Active Problems:   Hypertension   Type II diabetes mellitus with nephropathy ([place])   S/P minimally invasive maze operation for atrial fibrillation   History of GI bleed     Discharge Instructions  Discharge Instructions    Ambulatory referral to Physical Therapy   Complete by:  As directed    Pt is not to use rt arm/ shoulder during therapy--waiting on surgery     Allergies as of 12/16/2017  Reactions   Penicillins Anaphylaxis   Has patient had a PCN reaction causing immediate rash, facial/tongue/throat swelling, SOB or lightheadedness with hypotension: yes Has patient had a PCN reaction causing severe rash  involving mucus membranes or skin necrosis: no Has patient had a PCN reaction that required hospitalization: no Has patient had a PCN reaction occurring within the last 10 years: no If all of the above answers are "NO", then may proceed with Cephalosporin use.   Shellfish Allergy Anaphylaxis   Amiodarone Nausea And Vomiting   Fenofibrate Other (See Comments)   "Dr told pt to discontinue med"      Medication List    STOP taking these medications   aspirin 325 MG tablet Replaced by:  aspirin EC 81 MG tablet     TAKE these medications   ALPRAZolam 0.25 MG tablet Commonly known as:  XANAX Take 0.25 mg by mouth daily.   apixaban 5 MG Tabs tablet Commonly known as:  ELIQUIS Take 1 tablet (5 mg total) by mouth 2 (two) times daily.   aspirin EC 81 MG tablet Take 1 tablet (81 mg total) by mouth daily. Replaces:  aspirin 325 MG tablet   atorvastatin 80 MG tablet Commonly known as:  LIPITOR Take 1 tablet (80 mg total) by mouth daily.   BILBERRY PO Take 1 capsule by mouth daily.   diclofenac sodium 1 % Gel Commonly known as:  VOLTAREN Apply 4 g topically 4 (four) times daily. To affected joint.   diphenhydramine-acetaminophen 25-500 MG Tabs tablet Commonly known as:  TYLENOL PM Take 1 tablet by mouth at bedtime.   furosemide 40 MG tablet Commonly known as:  LASIX Take 0.5 tablets (20 mg total) by mouth as needed for edema.   irbesartan 75 MG tablet Commonly known as:  AVAPRO TAKE 1 TABLET BY MOUTH  DAILY   Melatonin 5 MG Caps Take 1 capsule by mouth at bedtime.   metFORMIN 500 MG tablet Commonly known as:  GLUCOPHAGE Take 1 tablet (500 mg total) by mouth 2 (two) times daily with a meal.   ondansetron 4 MG tablet Commonly known as:  ZOFRAN Take 1 tablet (4 mg total) by mouth every 8 (eight) hours as needed for nausea or vomiting.   sertraline 50 MG tablet Commonly known as:  ZOLOFT Take 100 mg by mouth daily.   traMADol 50 MG tablet Commonly known as:   ULTRAM Take 50 mg by mouth every 6 (six) hours as needed for moderate pain.      Follow-up Information    Trixie Dredge, [place].   Specialty:  Physician Assistant Contact information: [address] [place] [address] [phone]        [facility] Follow up.   Specialty:  Rehabilitation Why:  They will contact you for the first appointment. Contact information: [place] [id number]       Lelon Perla, MD Follow up.   Specialty:  Cardiology Why:  as scheduled in 2 days Contact information: [address] [place] [address] [phone]           Consultations:  Cardiology   Neurology   Procedures/Studies:  EEG IMPRESSION: This is a normal awake electroencephalogram.  There are no focal lateralizing or epileptiform features.   2D echo  Study Conclusions - Left ventricle: The cavity size was normal. Systolic function was vigorous. The estimated ejection fraction was in the range of  65% to 70%. Wall motion was normal; there were no regional wall motion abnormalities. Indeterminate diastolic function. Doppler parameters are consistent with high ventricular filling pressure. - Aortic valve: Transvalvular velocity was within the normal range. There was no stenosis. There was trivial regurgitation. - Mitral valve: Prior procedures included surgical repair. An annular ring prosthesis was present. Neochordae are identified, and are not significantly changed in appearance compared to the prior study. Transvalvular velocity was within the normal range. There was no evidence for stenosis. There was trivial regurgitation. Mean gradient (D): 5 mm Hg (HR 93 bpm). - Left atrium: The atrium was normal in size. - Right ventricle: The cavity size was mildly dilated. Wall thickness was normal. Systolic function was normal. - Right  atrium: The atrium was mildly to moderately dilated. Central venous pressure (est): 5 mm Hg. - Tricuspid valve: There was mild regurgitation. - Pulmonary arteries: Systolic pressure was mildly increased. PA peak pressure: 38 mm Hg (S). - Inferior vena cava: The vessel was normal in size. The respirophasic diameter changes were in the normal range (= 50%), consistent with normal central venous pressure. - Pericardium, extracardiac: There was no pericardial effusion.  Impressions: - No cardiac source of embolism was identified, but cannot be ruled out on the basis of this examination.  Ct Head Wo Contrast  Result Date: 12/16/2017 CLINICAL DATA:  77 year old male with punctate right superior frontal lobe infarcts on MRI 12/12/2017. Small focus of suspected subarachnoid hemorrhage at the left superior frontal convexity. Left upper extremity weakness at that time. EXAM: CT HEAD WITHOUT CONTRAST TECHNIQUE: Contiguous axial images were obtained from the base of the skull through the vertex without intravenous contrast. COMPARISON:  Brain MRI 12/12/2017. Head CT 02/25/2014. FINDINGS: Brain: The left superior frontal sulci appear normal now. Subtle if any CT changes in the right superior frontal lobe corresponding to the recent MRI findings. Gray-white matter differentiation is within normal limits for age. No midline shift, ventriculomegaly, mass effect, evidence of mass lesion, intracranial hemorrhage or evidence of cortically based acute infarction. Vascular: Calcified atherosclerosis at the skull base. No suspicious intracranial vascular hyperdensity. Skull: Negative. Sinuses/Orbits: Visualized paranasal sinuses and mastoids are stable and well pneumatized. Other: Visualized orbits and scalp soft tissues are within normal limits. IMPRESSION: 1. The small volume left superior frontal convexity subarachnoid blood suspected on 12/22/2017 appears resolved and there are subtle if any CT changes at the site of the  tiny the right superior frontal lobe infarcts. 2. No new intracranial abnormality. Electronically Signed   By: Genevie Ann M.D.   On: 12/16/2017 13:25   Mr Brain Wo Contrast  Addendum Date: 12/12/2017   ADDENDUM REPORT: 12/12/2017 15:45 ADDENDUM: These results were called by telephone at the time of interpretation on 12/12/2017 at 3:45 pm to Mellody Dance, DO, who verbally acknowledged these results. Electronically Signed   By: Staci Righter M.D.   On: 12/12/2017 15:45   Result Date: 12/12/2017 CLINICAL DATA:  Gait disorder with slurred speech. Recent carotid Doppler exam for symptoms of LEFT upper extremity weakness. EXAM: MRI HEAD WITHOUT CONTRAST MRA HEAD WITHOUT CONTRAST TECHNIQUE: Multiplanar, multiecho pulse sequences of the brain and surrounding structures were obtained without intravenous contrast. Angiographic images of the head were obtained using MRA technique without contrast. COMPARISON:  Carotid Dopplers 12/08/2017. MR head 02/25/2014. FINDINGS: MRI HEAD FINDINGS Brain: There is a small 3-4 mm area of restricted diffusion, RIGHT posterior frontal cortex and subcortical white matter, corresponding low ADC, consistent with acute infarction. No other acute  infarcts are detected. Abnormal FLAIR signal in a LEFT posterior frontal sulcus is observed (series 8, image 19, 20.). This is accompanied by susceptibility on GRE both in that sulcus as well as an adjacent prefrontal gyrus (series 7 image 16-17). No corresponding restricted diffusion. This represents a suspected contralateral LEFT MCA territory embolic stroke, with a small cortical hemorrhage due to reperfusion, and slight but definite subarachnoid hemorrhage. Generalized atrophy. Mild subcortical and periventricular T2 and FLAIR hyperintensities, likely chronic microvascular ischemic change. Vascular: Flow voids are maintained throughout the carotid, basilar, and vertebral arteries. Skull and upper cervical spine: Normal marrow signal.  Sinuses/Orbits: Negative. Other: None. MRA HEAD FINDINGS The internal carotid arteries are widely patent. The basilar artery is widely patent. LEFT vertebral is the sole contributor to the basilar. RIGHT vertebral ends in PICA. No intracranial stenosis or aneurysm. Fetal LEFT PCA. IMPRESSION: Small acute RIGHT posterior frontal cortical and subcortical infarct correlates with history of recent LEFT upper extremity weakness. Small focus of hemorrhage in the LEFT frontal cortex with adjacent subarachnoid blood in the sulcus; this likely represents reperfusion from a subacute embolic LEFT MCA infarct. Atrophy with mild small vessel disease. Unremarkable MRA of the intracranial circulation. Attempts are being made to contact the ordering provider. Electronically Signed: By: Staci Righter M.D. On: 12/12/2017 15:33   US Carotid Bilateral  Result Date: 12/09/2017 CLINICAL DATA:  Left upper extremity weakness EXAM: BILATERAL CAROTID DUPLEX ULTRASOUND TECHNIQUE: Pearline Cables scale imaging, color Doppler and duplex ultrasound were performed of bilateral carotid and vertebral arteries in the neck. COMPARISON:  None. FINDINGS: Criteria: Quantification of carotid stenosis is based on velocity parameters that correlate the residual internal carotid diameter with NASCET-based stenosis levels, using the diameter of the distal internal carotid lumen as the denominator for stenosis measurement. The following velocity measurements were obtained: RIGHT ICA: 87 cm/sec CCA: 063 cm/sec SYSTOLIC ICA/CCA RATIO:  0.7 ECA: 90 cm/sec LEFT ICA: 101 cm/sec CCA: 77 cm/sec SYSTOLIC ICA/CCA RATIO:  1.4 ECA: 109 cm/sec RIGHT CAROTID ARTERY: There is moderate smooth mixed plaque in the upper right common carotid artery. There is moderate shadowing mixed plaque in the bulb. Low resistance internal carotid Doppler pattern. There is also moderate irregular calcified plaque in the mid internal carotid artery. RIGHT VERTEBRAL ARTERY:  Antegrade. LEFT CAROTID  ARTERY: Moderate calcified plaque in the bulb. There is moderate irregular calcified plaque in the lower internal carotid artery. Low resistance internal carotid Doppler pattern is preserved. LEFT VERTEBRAL ARTERY:  Antegrade. Upper extremity blood pressures: RIGHT: 162/82 LEFT: 159/84 IMPRESSION: Less than 50% stenosis in the right and left internal carotid arteries. Electronically Signed   By: Marybelle Killings M.D.   On: 12/09/2017 07:37   Mr Jodene Nam Head Wo Contrast  Addendum Date: 12/12/2017   ADDENDUM REPORT: 12/12/2017 15:45 ADDENDUM: These results were called by telephone at the time of interpretation on 12/12/2017 at 3:45 pm to Mellody Dance, DO, who verbally acknowledged these results. Electronically Signed   By: Staci Righter M.D.   On: 12/12/2017 15:45   Result Date: 12/12/2017 CLINICAL DATA:  Gait disorder with slurred speech. Recent carotid Doppler exam for symptoms of LEFT upper extremity weakness. EXAM: MRI HEAD WITHOUT CONTRAST MRA HEAD WITHOUT CONTRAST TECHNIQUE: Multiplanar, multiecho pulse sequences of the brain and surrounding structures were obtained without intravenous contrast. Angiographic images of the head were obtained using MRA technique without contrast. COMPARISON:  Carotid Dopplers 12/08/2017. MR head 02/25/2014. FINDINGS: MRI HEAD FINDINGS Brain: There is a small 3-4 mm area of restricted  diffusion, RIGHT posterior frontal cortex and subcortical white matter, corresponding low ADC, consistent with acute infarction. No other acute infarcts are detected. Abnormal FLAIR signal in a LEFT posterior frontal sulcus is observed (series 8, image 19, 20.). This is accompanied by susceptibility on GRE both in that sulcus as well as an adjacent prefrontal gyrus (series 7 image 16-17). No corresponding restricted diffusion. This represents a suspected contralateral LEFT MCA territory embolic stroke, with a small cortical hemorrhage due to reperfusion, and slight but definite subarachnoid  hemorrhage. Generalized atrophy. Mild subcortical and periventricular T2 and FLAIR hyperintensities, likely chronic microvascular ischemic change. Vascular: Flow voids are maintained throughout the carotid, basilar, and vertebral arteries. Skull and upper cervical spine: Normal marrow signal. Sinuses/Orbits: Negative. Other: None. MRA HEAD FINDINGS The internal carotid arteries are widely patent. The basilar artery is widely patent. LEFT vertebral is the sole contributor to the basilar. RIGHT vertebral ends in PICA. No intracranial stenosis or aneurysm. Fetal LEFT PCA. IMPRESSION: Small acute RIGHT posterior frontal cortical and subcortical infarct correlates with history of recent LEFT upper extremity weakness. Small focus of hemorrhage in the LEFT frontal cortex with adjacent subarachnoid blood in the sulcus; this likely represents reperfusion from a subacute embolic LEFT MCA infarct. Atrophy with mild small vessel disease. Unremarkable MRA of the intracranial circulation. Attempts are being made to contact the ordering provider. Electronically Signed: By: Staci Righter M.D. On: 12/12/2017 15:33    Subjective: - no chest pain, shortness of breath, no abdominal pain, nausea or vomiting.   Discharge Exam: Vitals:   12/16/17 1143 12/16/17 1549  BP: 127/74 135/84  Pulse: 92 100  Resp: 18 18  Temp: 98.5 F (36.9 C) 97.8 F (36.6 C)  SpO2: 100% 100%    General: Pt is alert, awake, not in acute distress Cardiovascular: RRR, S1/S2 +, no rubs, no gallops Respiratory: CTA bilaterally, no wheezing, no rhonchi Abdominal: Soft, NT, ND, bowel sounds + Extremities: no edema, no cyanosis    The results of significant diagnostics from this hospitalization (including imaging, microbiology, ancillary and laboratory) are listed below for reference.     Microbiology: No results found for this or any previous visit (from the past 240 hour(s)).   Labs: BNP (last 3 results) No results for input(s): BNP  in the last 8760 hours. Basic Metabolic Panel: Recent Labs  Lab 12/15/17 1526 12/16/17 0350  NA 133* 134*  K 4.8 4.7  CL 101 104  CO2 24 23  GLUCOSE 96 92  BUN 29* 28*  CREATININE 1.80* 1.65*  CALCIUM 9.5 9.1   Liver Function Tests: Recent Labs  Lab 12/15/17 1526  AST 26  ALT 18  ALKPHOS 110  BILITOT 0.6  PROT 7.6  ALBUMIN 4.2   No results for input(s): LIPASE, AMYLASE in the last 168 hours. No results for input(s): AMMONIA in the last 168 hours. CBC: Recent Labs  Lab 12/15/17 1526  WBC 6.8  NEUTROABS 4.7  HGB 9.5*  HCT 30.6*  MCV 96.5  PLT 333   Cardiac Enzymes: No results for input(s): CKTOTAL, CKMB, CKMBINDEX, TROPONINI in the last 168 hours. BNP: Invalid input(s): POCBNP CBG: Recent Labs  Lab 12/16/17 0640 12/16/17 1141 12/16/17 1650  GLUCAP 92 120* 181*   D-Dimer No results for input(s): DDIMER in the last 72 hours. Hgb A1c Recent Labs    12/16/17 0350  HGBA1C 6.1*   Lipid Profile Recent Labs    12/16/17 0350  CHOL 113  HDL 45  LDLCALC 47  TRIG 106  CHOLHDL 2.5   Thyroid function studies No results for input(s): TSH, T4TOTAL, T3FREE, THYROIDAB in the last 72 hours.  Invalid input(s): FREET3 Anemia work up No results for input(s): VITAMINB12, FOLATE, FERRITIN, TIBC, IRON, RETICCTPCT in the last 72 hours. Urinalysis    Component Value Date/Time   COLORURINE YELLOW 12/15/2017 1526   APPEARANCEUR HAZY (A) 12/15/2017 1526   LABSPEC 1.013 12/15/2017 1526   PHURINE 5.0 12/15/2017 1526   GLUCOSEU NEGATIVE 12/15/2017 1526   HGBUR NEGATIVE 12/15/2017 [place] 12/15/2017 1526   KETONESUR 5 (A) 12/15/2017 1526   PROTEINUR 30 (A) 12/15/2017 1526   NITRITE NEGATIVE 12/15/2017 1526   LEUKOCYTESUR NEGATIVE 12/15/2017 1526   Sepsis Labs Invalid input(s): PROCALCITONIN,  WBC,  LACTICIDVEN   Time coordinating discharge: 45 minutes  SIGNED:  Marzetta Board, MD  [facility] 12/16/2017, 6:20 PM Pager  [phone]  If 7PM-7AM, please contact night-coverage [email or web address] Password [email or web address]
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
## 2017-12-16 NOTE — Progress Notes (Signed)
PT Progress Note for Charges    12/16/17 1600  PT General Charges  $$ ACUTE PT VISIT 1 Visit  PT Evaluation  $PT Eval Moderate Complexity 1 Mod  Sherie Don, [place], DPT  Acute Rehabilitation Services Pager [phone] Office [phone]

## 2017-12-16 NOTE — Progress Notes (Signed)
EEG complete - results pending 

## 2017-12-18 ENCOUNTER — Encounter

## 2017-12-18 ENCOUNTER — Other Ambulatory Visit: Payer: Self-pay

## 2017-12-18 ENCOUNTER — Encounter: Payer: Self-pay | Admitting: Sports Medicine

## 2017-12-18 ENCOUNTER — Encounter: Payer: Self-pay | Admitting: Cardiology

## 2017-12-18 ENCOUNTER — Ambulatory Visit (INDEPENDENT_AMBULATORY_CARE_PROVIDER_SITE_OTHER): Payer: Medicare Other | Admitting: Sports Medicine

## 2017-12-18 ENCOUNTER — Ambulatory Visit: Payer: Medicare Other | Admitting: Cardiology

## 2017-12-18 VITALS — BP 129/70 | HR 100 | Ht 65.0 in | Wt 152.4 lb

## 2017-12-18 DIAGNOSIS — M19011 Primary osteoarthritis, right shoulder: Secondary | ICD-10-CM

## 2017-12-18 DIAGNOSIS — I480 Paroxysmal atrial fibrillation: Secondary | ICD-10-CM

## 2017-12-18 DIAGNOSIS — I639 Cerebral infarction, unspecified: Secondary | ICD-10-CM | POA: Diagnosis not present

## 2017-12-18 DIAGNOSIS — Z9889 Other specified postprocedural states: Secondary | ICD-10-CM | POA: Diagnosis not present

## 2017-12-18 DIAGNOSIS — I10 Essential (primary) hypertension: Secondary | ICD-10-CM

## 2017-12-18 MED ORDER — APIXABAN 5 MG PO TABS: 5.0000 mg | ORAL_TABLET | Freq: Two times a day (BID) | ORAL | 1 refills | Status: DC

## 2017-12-18 NOTE — Patient Instructions (Signed)
  You are scheduled for a TEE on Tuesday 12/29/17 with Dr.Crenshaw.  Please arrive at the [facility] (Main Entrance A) at [facility]: [address] [place], [place] [address] at 7:30 am.  DIET: Nothing to eat or drink after midnight except a sip of water with medications (see medication instructions below)  Medication Instructions:  Hold Furosemide morning of procedure   Labs: None needed for procedure  You must have a responsible person to drive you home and stay in the waiting area during your procedure. Failure to do so could result in cancellation.  Bring your insurance cards.  *Special Note: Every effort is made to have your procedure done on time. Occasionally there are emergencies that occur at the hospital that may cause delays. Please be patient if a delay does occur.
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
## 2017-12-18 NOTE — Assessment & Plan Note (Signed)
Pain is referrable to the glenohumeral joint and the subacromial space, he had a subacromial injection with Dr. Georgina Snell in July of this year. Repeat injection today, glenohumeral this time. Therapy was not very effective for him. He did have an MRI that showed multiple but expected pathologic change including labral fraying, biceps fraying and rotator cuff tearing. Return to see me 1 month after the injection to evaluate response, he is not a good candidate right now for surgery.

## 2017-12-28 ENCOUNTER — Telehealth: Payer: Self-pay | Admitting: Cardiology

## 2017-12-28 NOTE — Telephone Encounter (Signed)
Spoke with pt wife and adv her of the pt pre-procedure instructions. Pt TEE sch for 11/26 @ 8am with Dr.Crenshaw.  DIET: Nothing to eat or drink after midnight except a sip of water with medications (see medication instructions below) Medication Instructions:Hold Furosemide morning of procedure Pt is to arrive @[facility] entrance @ 7:30am.  Pt wife verbalized understanding to the instruction given.

## 2017-12-28 NOTE — Telephone Encounter (Signed)
Patient's wife is calling for instructions and time for procedure [facility] is having tomorrow.

## 2017-12-29 ENCOUNTER — Ambulatory Visit (HOSPITAL_BASED_OUTPATIENT_CLINIC_OR_DEPARTMENT_OTHER): Payer: Medicare Other

## 2017-12-29 ENCOUNTER — Ambulatory Visit (HOSPITAL_COMMUNITY)
Admission: RE | Admit: 2017-12-29 | Discharge: 2017-12-29 | Disposition: A | Discharge status: Home / Self Care | Payer: Medicare Other | Source: Ambulatory Visit | Attending: Cardiology | Admitting: Cardiology

## 2017-12-29 ENCOUNTER — Encounter (HOSPITAL_COMMUNITY)
Admission: RE | Disposition: A | Discharge status: Home / Self Care | Payer: Self-pay | Source: Ambulatory Visit | Attending: Cardiology

## 2017-12-29 ENCOUNTER — Other Ambulatory Visit: Payer: Self-pay

## 2017-12-29 ENCOUNTER — Encounter (HOSPITAL_COMMUNITY): Payer: Self-pay | Admitting: Cardiology

## 2017-12-29 DIAGNOSIS — I5032 Chronic diastolic (congestive) heart failure: Secondary | ICD-10-CM | POA: Insufficient documentation

## 2017-12-29 DIAGNOSIS — Z8249 Family history of ischemic heart disease and other diseases of the circulatory system: Secondary | ICD-10-CM | POA: Insufficient documentation

## 2017-12-29 DIAGNOSIS — Z7982 Long term (current) use of aspirin: Secondary | ICD-10-CM | POA: Diagnosis not present

## 2017-12-29 DIAGNOSIS — I340 Nonrheumatic mitral (valve) insufficiency: Secondary | ICD-10-CM | POA: Diagnosis not present

## 2017-12-29 DIAGNOSIS — I480 Paroxysmal atrial fibrillation: Secondary | ICD-10-CM | POA: Diagnosis not present

## 2017-12-29 DIAGNOSIS — I639 Cerebral infarction, unspecified: Secondary | ICD-10-CM

## 2017-12-29 DIAGNOSIS — E1122 Type 2 diabetes mellitus with diabetic chronic kidney disease: Secondary | ICD-10-CM | POA: Insufficient documentation

## 2017-12-29 DIAGNOSIS — Z79899 Other long term (current) drug therapy: Secondary | ICD-10-CM | POA: Diagnosis not present

## 2017-12-29 DIAGNOSIS — H409 Unspecified glaucoma: Secondary | ICD-10-CM | POA: Insufficient documentation

## 2017-12-29 DIAGNOSIS — Z955 Presence of coronary angioplasty implant and graft: Secondary | ICD-10-CM | POA: Insufficient documentation

## 2017-12-29 DIAGNOSIS — I272 Pulmonary hypertension, unspecified: Secondary | ICD-10-CM | POA: Insufficient documentation

## 2017-12-29 DIAGNOSIS — Z9889 Other specified postprocedural states: Secondary | ICD-10-CM | POA: Insufficient documentation

## 2017-12-29 DIAGNOSIS — F419 Anxiety disorder, unspecified: Secondary | ICD-10-CM | POA: Insufficient documentation

## 2017-12-29 DIAGNOSIS — Z87891 Personal history of nicotine dependence: Secondary | ICD-10-CM | POA: Insufficient documentation

## 2017-12-29 DIAGNOSIS — I2510 Atherosclerotic heart disease of native coronary artery without angina pectoris: Secondary | ICD-10-CM | POA: Diagnosis not present

## 2017-12-29 DIAGNOSIS — Z7984 Long term (current) use of oral hypoglycemic drugs: Secondary | ICD-10-CM | POA: Insufficient documentation

## 2017-12-29 DIAGNOSIS — G459 Transient cerebral ischemic attack, unspecified: Secondary | ICD-10-CM | POA: Insufficient documentation

## 2017-12-29 DIAGNOSIS — I130 Hypertensive heart and chronic kidney disease with heart failure and stage 1 through stage 4 chronic kidney disease, or unspecified chronic kidney disease: Secondary | ICD-10-CM | POA: Insufficient documentation

## 2017-12-29 DIAGNOSIS — E785 Hyperlipidemia, unspecified: Secondary | ICD-10-CM | POA: Diagnosis not present

## 2017-12-29 DIAGNOSIS — I051 Rheumatic mitral insufficiency: Secondary | ICD-10-CM | POA: Insufficient documentation

## 2017-12-29 DIAGNOSIS — Z7901 Long term (current) use of anticoagulants: Secondary | ICD-10-CM | POA: Diagnosis not present

## 2017-12-29 DIAGNOSIS — K219 Gastro-esophageal reflux disease without esophagitis: Secondary | ICD-10-CM | POA: Insufficient documentation

## 2017-12-29 DIAGNOSIS — N183 Chronic kidney disease, stage 3 (moderate): Secondary | ICD-10-CM | POA: Diagnosis not present

## 2017-12-29 DIAGNOSIS — Q211 Atrial septal defect: Secondary | ICD-10-CM | POA: Diagnosis not present

## 2017-12-29 HISTORY — PX: TEE WITHOUT CARDIOVERSION: SHX5443

## 2017-12-29 LAB — GLUCOSE, CAPILLARY: Glucose-Capillary: 92 mg/dL (ref 70–99)

## 2017-12-29 SURGERY — ECHOCARDIOGRAM, TRANSESOPHAGEAL
Anesthesia: Moderate Sedation

## 2017-12-29 MED ORDER — MIDAZOLAM HCL (PF) 10 MG/2ML IJ SOLN
INTRAMUSCULAR | Status: DC | PRN
  Administered 2017-12-29 (×2): 2 mg via INTRAVENOUS

## 2017-12-29 MED ORDER — SODIUM CHLORIDE 0.9 % IV SOLN
INTRAVENOUS | Status: DC
  Administered 2017-12-29: 08:00:00 via INTRAVENOUS

## 2017-12-29 MED ORDER — MIDAZOLAM HCL (PF) 5 MG/ML IJ SOLN
INTRAMUSCULAR | Status: AC
  Filled 2017-12-29: qty 2

## 2017-12-29 MED ORDER — FENTANYL CITRATE (PF) 100 MCG/2ML IJ SOLN
INTRAMUSCULAR | Status: DC | PRN
  Administered 2017-12-29: 25 ug via INTRAVENOUS

## 2017-12-29 MED ORDER — BUTAMBEN-TETRACAINE-BENZOCAINE 2-2-14 % EX AERO
INHALATION_SPRAY | CUTANEOUS | Status: DC | PRN
  Administered 2017-12-29: 2 via TOPICAL

## 2017-12-29 MED ORDER — FENTANYL CITRATE (PF) 100 MCG/2ML IJ SOLN
INTRAMUSCULAR | Status: AC
  Filled 2017-12-29: qty 2
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
## 2017-12-29 NOTE — Progress Notes (Signed)
  Echocardiogram Echocardiogram Transesophageal has been performed.  [name] [name] 12/29/2017, 8:57 AM

## 2017-12-29 NOTE — Interval H&P Note (Signed)
History and Physical Interval Note:  12/29/2017 8:03 AM  [name] [name]  has presented today for surgery, with the diagnosis of STROKE  The various methods of treatment have been discussed with the patient and family. After consideration of risks, benefits and other options for treatment, the patient has consented to  Procedure(s): TRANSESOPHAGEAL ECHOCARDIOGRAM (TEE) (N/A) as a surgical intervention .  The patient's history has been reviewed, patient examined, no change in status, stable for surgery.  I have reviewed the patient's chart and labs.  Questions were answered to the patient's satisfaction.     Kirk Ruths

## 2017-12-29 NOTE — Progress Notes (Signed)
    Transesophageal Echocardiogram Note  [name] [name] [id number] [birth year]  Procedure: Transesophageal Echocardiogram Indications: CVA  Procedure Details Consent: Obtained Time Out: Verified patient identification, verified procedure, site/side was marked, verified correct patient position, special equipment/implants available, Radiology Safety Procedures followed,  medications/allergies/relevent history reviewed, required imaging and test results available.  Performed  Medications:  During this procedure the patient is administered a total of Versed 4 mg and Fentanyl 25 mcg  to achieve and maintain moderate conscious sedation.  The patient's heart rate, blood pressure, and oxygen saturation are monitored continuously during the procedure. The period of conscious sedation is 30 minutes, of which I was present face-to-face 100% of this time.  Preliminary  Normal LV function; s/p MV repair with mild MR; mean gradient 7 mmHg; s/p clipping of LAA; PFO noted; no vegetations.   Complications: No apparent complications Patient did tolerate procedure well.  Kirk Ruths, MD

## 2017-12-29 NOTE — Discharge Instructions (Signed)

## 2018-01-04 ENCOUNTER — Other Ambulatory Visit (INDEPENDENT_AMBULATORY_CARE_PROVIDER_SITE_OTHER): Payer: Medicare Other

## 2018-01-04 ENCOUNTER — Other Ambulatory Visit: Payer: Self-pay | Admitting: Cardiology

## 2018-01-04 DIAGNOSIS — I639 Cerebral infarction, unspecified: Secondary | ICD-10-CM | POA: Diagnosis not present

## 2018-01-04 DIAGNOSIS — I10 Essential (primary) hypertension: Secondary | ICD-10-CM | POA: Diagnosis not present

## 2018-01-04 DIAGNOSIS — D649 Anemia, unspecified: Secondary | ICD-10-CM

## 2018-01-04 DIAGNOSIS — Z8719 Personal history of other diseases of the digestive system: Secondary | ICD-10-CM | POA: Diagnosis not present

## 2018-01-04 LAB — CBC WITH DIFFERENTIAL/PLATELET
Absolute Monocytes: 614 cells/uL (ref 200–950)
BASOS PCT: 0.5 %
Basophils Absolute: 31 cells/uL (ref 0–200)
EOS ABS: 198 {cells}/uL (ref 15–500)
Eosinophils Relative: 3.2 %
HCT: 23.9 % — ABNORMAL LOW (ref 38.5–50.0)
Hemoglobin: 8 g/dL — ABNORMAL LOW (ref 13.2–17.1)
Lymphs Abs: 862 cells/uL (ref 850–3900)
MCH: 31 pg (ref 27.0–33.0)
MCHC: 33.5 g/dL (ref 32.0–36.0)
MCV: 92.6 fL (ref 80.0–100.0)
MONOS PCT: 9.9 %
MPV: 10.1 fL (ref 7.5–12.5)
Neutro Abs: 4495 cells/uL (ref 1500–7800)
Neutrophils Relative %: 72.5 %
PLATELETS: 266 10*3/uL (ref 140–400)
RBC: 2.58 10*6/uL — AB (ref 4.20–5.80)
RDW: 12.6 % (ref 11.0–15.0)
TOTAL LYMPHOCYTE: 13.9 %
WBC: 6.2 10*3/uL (ref 3.8–10.8)

## 2018-01-04 LAB — HEMOCCULT SLIDES (X 3 CARDS)
OCCULT 1: NEGATIVE
OCCULT 2: NEGATIVE
OCCULT 3: NEGATIVE
OCCULT 4: NEGATIVE

## 2018-01-18 ENCOUNTER — Ambulatory Visit: Payer: Medicare Other | Admitting: Physician Assistant

## 2018-01-18 ENCOUNTER — Ambulatory Visit: Payer: Medicare Other | Admitting: Sports Medicine

## 2018-01-20 ENCOUNTER — Telehealth: Payer: Self-pay | Admitting: *Deleted

## 2018-01-20 DIAGNOSIS — D649 Anemia, unspecified: Secondary | ICD-10-CM

## 2018-01-20 NOTE — Telephone Encounter (Addendum)
Left message for pt to call   ----- Message from Lelon Perla, MD sent at 01/20/2018 10:13 AM EST ----- DC ASA; add feso4 325 mg BID; CBC 3 days Kirk Ruths

## 2018-01-22 ENCOUNTER — Ambulatory Visit: Payer: Medicare Other | Admitting: Physician Assistant

## 2018-01-22 MED ORDER — FERROUS SULFATE 325 (65 FE) MG PO TABS: 325.0000 mg | ORAL_TABLET | Freq: Two times a day (BID) | ORAL | 3 refills | Status: DC

## 2018-01-22 NOTE — Telephone Encounter (Signed)
Spoke with pt wife, New script sent to the pharmacy and lab orders placed

## 2018-02-02 ENCOUNTER — Ambulatory Visit (INDEPENDENT_AMBULATORY_CARE_PROVIDER_SITE_OTHER): Payer: Medicare Other

## 2018-02-02 ENCOUNTER — Encounter: Payer: Self-pay | Admitting: Physician Assistant

## 2018-02-02 ENCOUNTER — Ambulatory Visit (INDEPENDENT_AMBULATORY_CARE_PROVIDER_SITE_OTHER): Payer: Medicare Other | Admitting: Physician Assistant

## 2018-02-02 VITALS — BP 159/78 | HR 91 | Temp 98.6°F | Resp 14 | Wt 148.0 lb

## 2018-02-02 DIAGNOSIS — N1832 Chronic kidney disease, stage 3b: Secondary | ICD-10-CM

## 2018-02-02 DIAGNOSIS — D500 Iron deficiency anemia secondary to blood loss (chronic): Secondary | ICD-10-CM

## 2018-02-02 DIAGNOSIS — R05 Cough: Secondary | ICD-10-CM

## 2018-02-02 DIAGNOSIS — N183 Chronic kidney disease, stage 3 unspecified: Secondary | ICD-10-CM

## 2018-02-02 DIAGNOSIS — R059 Cough, unspecified: Secondary | ICD-10-CM

## 2018-02-02 LAB — POCT UA - MICROALBUMIN
Creatinine, POC: 200 mg/dL
Microalbumin Ur, POC: 80 mg/L

## 2018-02-02 LAB — POCT HEMOGLOBIN: Hemoglobin: 8.9 g/dL — AB (ref 11–14.6)

## 2018-02-02 MED ORDER — FERROUS GLUCONATE 324 (38 FE) MG PO TABS: ORAL_TABLET | ORAL | 3 refills | Status: DC
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
## 2018-02-02 NOTE — Patient Instructions (Addendum)
Iron Deficiency Anemia, Adult  Iron deficiency anemia is a condition in which the concentration of red blood cells or hemoglobin in the blood is below normal because of too little iron. Hemoglobin is a substance in red blood cells that carries oxygen to the body's tissues. When the concentration of red blood cells or hemoglobin is too low, not enough oxygen reaches these tissues.  Iron deficiency anemia is usually long-lasting (chronic) and it develops over time. It may or may not cause symptoms. It is a common type of anemia.  What are the causes?  This condition may be caused by:   Not enough iron in the diet.   Blood loss caused by bleeding in the intestine.   Blood loss from a gastrointestinal condition like Crohn disease.   Frequent blood draws, such as from blood donation.   Abnormal absorption in the gut.   Heavy menstrual periods in women.   Cancers of the gastrointestinal system, such as colon cancer.  What are the signs or symptoms?  Symptoms of this condition may include:   Fatigue.   Headache.   Pale skin, lips, and nail beds.   Poor appetite.   Weakness.   Shortness of breath.   Dizziness.   Cold hands and feet.   Fast or irregular heartbeat.   Irritability. This is more common in severe anemia.   Rapid breathing. This is more common in severe anemia.  Mild anemia may not cause any symptoms.  How is this diagnosed?  This condition is diagnosed based on:   Your medical history.   A physical exam.   Blood tests.  You may have additional tests to find the underlying cause of your anemia, such as:   Testing for blood in the stool (fecal occult blood test).   A procedure to see inside your colon and rectum (colonoscopy).   A procedure to see inside your esophagus and stomach (endoscopy).   A test in which cells are removed from bone marrow (bone marrow aspiration) or fluid is removed from the bone marrow to be examined (biopsy). This is rarely needed.  How is this treated?  This  condition is treated by correcting the cause of your iron deficiency. Treatment may involve:   Adding iron-rich foods to your diet.   Taking iron supplements. If you are pregnant or breastfeeding, you may need to take extra iron because your normal diet usually does not provide the amount of iron that you need.   Increasing vitamin C intake. Vitamin C helps your body absorb iron. Your health care provider may recommend that you take iron supplements along with a glass of orange juice or a vitamin C supplement.   Medicines to make heavy menstrual flow lighter.   Surgery.  You may need repeat blood tests to determine whether treatment is working. Depending on the underlying cause, the anemia should be corrected within 2 months of starting treatment. If the treatment does not seem to be working, you may need more testing.  Follow these instructions at home:  Medicines   Take over-the-counter and prescription medicines only as told by your health care provider. This includes iron supplements and vitamins.   If you cannot tolerate taking iron supplements by mouth, talk with your health care provider about taking them through a vein (intravenously) or an injection into a muscle.   For the best iron absorption, you should take iron supplements when your stomach is empty. If you cannot tolerate them on an empty stomach,   you may need to take them with food.   Do not drink milk or take antacids at the same time as your iron supplements. Milk and antacids may interfere with iron absorption.   Iron supplements can cause constipation. To prevent constipation, include fiber in your diet as told by your health care provider. A stool softener may also be recommended.  Eating and drinking     Talk with your health care provider before changing your diet. He or she may recommend that you eat foods that contain a lot of iron, such as:  ? Liver.  ? Low-fat (lean) beef.  ? Breads and cereals that have iron added to them (are  fortified).  ? Eggs.  ? Dried fruit.  ? Dark green, leafy vegetables.   To help your body use the iron from iron-rich foods, eat those foods at the same time as fresh fruits and vegetables that are high in vitamin C. Foods that are high in vitamin C include:  ? Oranges.  ? Peppers.  ? Tomatoes.  ? Mangoes.   Drinkenoughfluid to keep your urine clear or pale yellow.  General instructions   Return to your normal activities as told by your health care provider. Ask your health care provider what activities are safe for you.   Practice good hygiene. Anemia can make you more prone to illness and infection.   Keep all follow-up visits as told by your health care provider. This is important.  Contact a health care provider if:   You feel nauseous or you vomit.   You feel weak.   You have unexplained sweating.   You develop symptoms of constipation, such as:  ? Having fewer than three bowel movements a week.  ? Straining to have a bowel movement.  ? Having stools that are hard, dry, or larger than normal.  ? Feeling full or bloated.  ? Pain in the lower abdomen.  ? Not feeling relief after having a bowel movement.  Get help right away if:   You faint. If this happens, do not drive yourself to the hospital. Call your local emergency services (911 in the [place]   You have chest pain.   You have shortness of breath that:  ? Is severe.  ? Gets worse with physical activity.   You have a rapid heartbeat.   You become light-headed when getting up from a sitting or lying down position.  This information is not intended to replace advice given to you by your health care provider. Make sure you discuss any questions you have with your health care provider.  Document Released: 01/18/2000 Document Revised: 10/10/2015 Document Reviewed: 10/10/2015  [employer]vier Interactive Patient Education  2019 [employer]

## 2018-02-03 ENCOUNTER — Encounter: Payer: Self-pay | Admitting: Physician Assistant

## 2018-02-23 ENCOUNTER — Encounter: Payer: Self-pay | Admitting: Cardiology

## 2018-03-02 ENCOUNTER — Ambulatory Visit: Payer: Medicare Other | Admitting: Physician Assistant
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
## 2018-03-12 ENCOUNTER — Encounter: Payer: Self-pay | Admitting: Cardiology

## 2018-03-12 ENCOUNTER — Ambulatory Visit: Payer: Medicare Other | Admitting: Cardiology

## 2018-03-12 VITALS — BP 128/60 | HR 83 | Ht 65.0 in | Wt 145.4 lb

## 2018-03-12 DIAGNOSIS — I480 Paroxysmal atrial fibrillation: Secondary | ICD-10-CM

## 2018-03-12 DIAGNOSIS — E7800 Pure hypercholesterolemia, unspecified: Secondary | ICD-10-CM

## 2018-03-12 DIAGNOSIS — Z9889 Other specified postprocedural states: Secondary | ICD-10-CM

## 2018-03-12 DIAGNOSIS — I10 Essential (primary) hypertension: Secondary | ICD-10-CM

## 2018-03-12 NOTE — Patient Instructions (Signed)
Medication Instructions:  NO CHANGE If you need a refill on your cardiac medications before your next appointment, please call your pharmacy.   Lab work: If you have labs (blood work) drawn today and your tests are completely normal, you will receive your results only by: Marland Kitchen MyChart Message (if you have MyChart) OR . A paper copy in the mail If you have any lab test that is abnormal or we need to change your treatment, we will call you to review the results.  Follow-Up: At [facility], you and your health needs are our priority.  As part of our continuing mission to provide you with exceptional heart care, we have created designated Provider Care Teams.  These Care Teams include your primary Cardiologist (physician) and Advanced Practice Providers (APPs -  Physician Assistants and Nurse Practitioners) who all work together to provide you with the care you need, when you need it. You will need a follow up appointment in 6 months.  Please call our office 2 months in advance to schedule this appointment.  You may see Kirk Ruths, MD or one of the following Advanced Practice Providers on your designated Care Team:   Kerin Ransom, PA-C Roby Lofts, [place] . Sande Rives, PA-C   CALL IN June TO SCHEDULE APPOINTMENT IN [place]

## 2018-03-16 DIAGNOSIS — H61891 Other specified disorders of right external ear: Secondary | ICD-10-CM | POA: Diagnosis not present

## 2018-03-16 DIAGNOSIS — H6123 Impacted cerumen, bilateral: Secondary | ICD-10-CM | POA: Diagnosis not present

## 2018-03-17 ENCOUNTER — Other Ambulatory Visit: Payer: Self-pay

## 2018-03-18 MED ORDER — APIXABAN 5 MG PO TABS: 5.0000 mg | ORAL_TABLET | Freq: Two times a day (BID) | ORAL | 1 refills | Status: DC

## 2018-03-28 ENCOUNTER — Other Ambulatory Visit: Payer: Self-pay | Admitting: Physician Assistant

## 2018-03-28 DIAGNOSIS — N183 Chronic kidney disease, stage 3 unspecified: Secondary | ICD-10-CM

## 2018-03-28 DIAGNOSIS — I10 Essential (primary) hypertension: Secondary | ICD-10-CM

## 2018-04-05 ENCOUNTER — Ambulatory Visit: Payer: Medicare Other | Admitting: Thoracic Surgery (Cardiothoracic Vascular Surgery)

## 2018-04-05 ENCOUNTER — Encounter: Payer: Self-pay | Admitting: Thoracic Surgery (Cardiothoracic Vascular Surgery)

## 2018-04-05 ENCOUNTER — Other Ambulatory Visit: Payer: Self-pay

## 2018-04-05 VITALS — BP 115/64 | HR 94 | Resp 16 | Ht 65.0 in | Wt 146.7 lb

## 2018-04-05 DIAGNOSIS — Z8774 Personal history of (corrected) congenital malformations of heart and circulatory system: Secondary | ICD-10-CM | POA: Diagnosis not present

## 2018-04-05 DIAGNOSIS — Z8679 Personal history of other diseases of the circulatory system: Secondary | ICD-10-CM

## 2018-04-05 DIAGNOSIS — Z9889 Other specified postprocedural states: Secondary | ICD-10-CM | POA: Diagnosis not present

## 2018-04-05 NOTE — Patient Instructions (Signed)

## 2018-04-05 NOTE — Progress Notes (Signed)
[name] [name]       [name] [name],[place] [id number]             [phone]     CARDIOTHORACIC SURGERY OFFICE NOTE  Primary Cardiologist is [name] Ruths, MD PCP is [name] [name], [place]   HPI:  Patient is a 78 year old male with history of mitral regurgitation, recurrent persistent atrial fibrillation that failed DC cardioversion, chronic diastolic congestive heart failure, hypertension, chronic kidney disease (stageII-III), hyperlipidemia, and GE reflux disease who returns the office today for routine follow-up status post minimally invasive mitral valve repair and Maze procedure on April 02, 2017.    His early postoperative recovery was notable for an exacerbation of depression, loss of appetite, and weight loss.  He was last seen here in our office on July 06, 2017 at which time he was maintaining sinus rhythm and doing reasonably well.  He suffered a lower GI bleed in July 2019 after which time long-term anticoagulation was stopped.  He suffered a small embolic stroke in early November 2019.  Symptoms were reportedly mild and resolved within a few days.  Transthoracic and subsequent transesophageal echocardiograms performed at that time revealed intact mitral valve repair with very mild residual mitral regurgitation.  The patient was noted to have a small patent foramen ovale.  Eliquis was resumed at that time.  He returns to our office today and reports that he is doing exceptionally well.  His primary complaint is that of chronic right shoulder pain.  He is no longer exercising on a regular basis because of his shoulder pain.  However, he specifically denies any symptoms of exertional shortness of breath or chest discomfort.  He has not had any dizzy spells or palpitations.  He believes that he has continued to maintain regular rhythm ever since hospital discharge 1 year ago.  Overall he is delighted with his progress.   Current Outpatient Medications  Medication  Sig Dispense Refill  . acetaminophen (TYLENOL) 500 MG tablet Take 500 mg by mouth daily.    Marland Kitchen ALPRAZolam (XANAX) 0.25 MG tablet Take 0.25 mg by mouth at bedtime.     Marland Kitchen apixaban (ELIQUIS) 5 MG TABS tablet Take 1 tablet (5 mg total) by mouth 2 (two) times daily. 180 tablet 1  . atorvastatin (LIPITOR) 80 MG tablet Take 1 tablet (80 mg total) by mouth daily. (Patient taking differently: Take 80 mg by mouth every evening. ) 90 tablet 1  . Bilberry, Vaccinium myrtillus, (BILBERRY PO) Take 1 capsule by mouth daily.    . diclofenac sodium (VOLTAREN) 1 % GEL Apply 4 g topically 4 (four) times daily. To affected joint. (Patient taking differently: Apply 4 g topically 4 (four) times daily as needed (for joint pain). ) 100 g 11  . diphenhydramine-acetaminophen (TYLENOL PM) 25-500 MG TABS tablet Take 1 tablet by mouth at bedtime.    . ferrous gluconate (FERGON) 324 MG tablet Take 1 tab twice a day every other day 180 tablet 3  . irbesartan (AVAPRO) 75 MG tablet TAKE 1 TABLET BY MOUTH  DAILY 90 tablet 0  . Melatonin 5 MG CAPS Take 5 mg by mouth at bedtime.     . metFORMIN (GLUCOPHAGE) 500 MG tablet Take 1 tablet (500 mg total) by mouth 2 (two) times daily with a meal.    . Multiple Vitamins-Minerals (MULTIVITAMIN PO) Take 1 tablet by mouth daily.    . sertraline (ZOLOFT) 100 MG tablet Take 100 mg by mouth every evening.     Marland Kitchen  tetrahydrozoline (VISINE) 0.05 % ophthalmic solution Place 2 drops into both eyes daily as needed (for irritation).     No current facility-administered medications for this visit.       Physical Exam:   BP 115/64 (BP Location: Right Arm, Patient Position: Sitting, Cuff Size: Normal)   Pulse 94   Resp 16   Ht 5\' 5"  (1.651 m)   Wt 146 lb 11.2 oz (66.5 kg)   SpO2 95% Comment: ON RA  BMI 24.41 kg/m   General:  Well-appearing  Chest:   Clear to auscultation  CV:   Regular rate and rhythm without murmur  Incisions:  n/a  Abdomen:  Soft nontender  Extremities:  Warm and  well-perfused  Diagnostic Tests:  2 channel telemetry rhythm strip demonstrates normal sinus rhythm   Transesophageal Echocardiography  Patient:    [name] [name] MR #:       [id number] Study Date: 12/29/2017 Gender:     M Age:        28 Height:     162.6 cm Weight:     67.1 kg BSA:        1.75 m^2 Pt. Status: Room:   ADMITTING    [name] [name]  ATTENDING    [name] [name]  ORDERING     [name] [name]  PERFORMING   [name] [name]  REFERRING    [name] [name]  SONOGRAPHER  [name] [name], RDCS  cc:  ------------------------------------------------------------------- LV EF: 55% -   60%  ------------------------------------------------------------------- History:   PMH:  stroke 434.91. MV Repair s/p Maze Procedure. Coronary artery disease.  Congestive heart failure.  Transient ischemic attack.  Stroke.  Risk factors:  Hx of PFO. Hypertension. Dyslipidemia.  ------------------------------------------------------------------- Study Conclusions  - Left ventricle: Systolic function was normal. The estimated   ejection fraction was in the range of 55% to 60%. Wall motion was   normal; there were no regional wall motion abnormalities. - Aortic valve: No evidence of vegetation. There was trivial   regurgitation. - Mitral valve: Prior procedures included surgical repair. There   was mild regurgitation. - Left atrium: The atrium was mildly dilated. - Atrial septum: There was a patent foramen ovale. - Tricuspid valve: No evidence of vegetation. - Pulmonic valve: No evidence of vegetation. - Pulmonary arteries: Systolic pressure was moderately increased.  Impressions:  - Normal LV function; mildly dilated aortic root; trace AI; s/p MV   repair with mean gradient 7 mmHg but no MS by pressure halftime;   mild perivalvular MR; mild LAE; s/p LAA clipping; mild TR with   moderate pulmonary hypertension; PFO noted by color  doppler.  ------------------------------------------------------------------- Study data:   Study status:  Routine.  Consent:  The risks, benefits, and alternatives to the procedure were explained to the patient and informed consent was obtained.  Procedure:  The patient reported no pain pre or post test. Initial setup. The patient was brought to the laboratory. Surface ECG leads were monitored. Sedation. Conscious sedation was administered by cardiology staff. Transesophageal echocardiography. Topical anesthesia was obtained using viscous lidocaine. A transesophageal probe was inserted by the attending cardiologistwithout difficulty. Image quality was adequate.  Study completion:  The patient tolerated the procedure well. There were no complications.          Diagnostic transesophageal echocardiography.  2D and color Doppler. Birthdate:  Patient birthdate: 1941-01-06.  Age:  Patient is 78 yr old.  Sex:  Gender: male.    BMI: 25.4 kg/m^2.  Blood pressure: 110/50  Study date:  Study date: 12/29/2017. Study time: 08:11 AM. Location:  Endoscopy.  -------------------------------------------------------------------  ------------------------------------------------------------------- Left ventricle:  Systolic function was normal. The estimated ejection fraction was in the range of 55% to 60%. Wall motion was normal; there were no regional wall motion abnormalities.  ------------------------------------------------------------------- Aortic valve:   Mildly thickened leaflets. Cusp separation was normal.  No evidence of vegetation.  Doppler:  There was trivial regurgitation.  ------------------------------------------------------------------- Aorta:  Aortic root: The aortic root was mildly dilated. Descending aorta: The descending aorta had mild diffuse disease.  ------------------------------------------------------------------- Mitral valve:  Prior procedures included surgical  repair.  Doppler:  There was mild regurgitation.    Valve area by pressure half-time: 2.62 cm^2. Indexed valve area by pressure half-time: 1.49 cm^2/m^2.    Mean gradient (D): 7 mm Hg.  ------------------------------------------------------------------- Left atrium:  The atrium was mildly dilated.  ------------------------------------------------------------------- Atrial septum:  There was a patent foramen ovale.  ------------------------------------------------------------------- Right ventricle:  The cavity size was normal. Systolic function was normal.  ------------------------------------------------------------------- Pulmonic valve:    Structurally normal valve.   Cusp separation was normal.  No evidence of vegetation.  Doppler:  There was trivial regurgitation.  ------------------------------------------------------------------- Tricuspid valve:   Structurally normal valve.   Leaflet separation was normal.  No evidence of vegetation.  Doppler:  There was mild regurgitation.  ------------------------------------------------------------------- Pulmonary artery:   Systolic pressure was moderately increased.  ------------------------------------------------------------------- Right atrium:  The atrium was normal in size.  ------------------------------------------------------------------- Pericardium:  There was no pericardial effusion.  ------------------------------------------------------------------- Measurements   Mitral valve                         Value  Mitral mean velocity, D              117   cm/s  Mitral pressure half-time            84    ms  Mitral mean gradient, D              7     mm Hg  Mitral valve area, PHT, DP           2.62  cm^2  Mitral valve area/bsa, PHT, DP       1.49  cm^2/m^2  Mitral annulus VTI, D                50.4  cm    Tricuspid valve                      Value  Tricuspid regurg peak velocity       336   cm/s  Tricuspid  peak RV-RA gradient        45    mm Hg  Legend: (L)  and  (H)  mark values outside specified reference range.  ------------------------------------------------------------------- Prepared and Electronically Authenticated by  [name] [name] 2019-11-26T10:47:23   Impression:  Patient is doing well and maintaining sinus rhythm approximately 1 year status post minimally invasive mitral valve repair and Maze procedure.  He did experience a very small embolic stroke in early November.  He was not on oral anticoagulation at that time.  He is now anticoagulated using Eliquis.  Follow-up echocardiograms have revealed intact mitral valve repair with normal left ventricular function.   Plan:  We have not recommended any changes to the patient's current medications.  The patient has been reminded regarding the importance of dental hygiene and the lifelong need for antibiotic prophylaxis  for all dental cleanings and other related invasive procedures.  The patient will continue to follow-up intermittently with Dr. [name] and return to our office in the future only should specific problems or questions arise.  I spent in excess of 15 minutes during the conduct of this office consultation and >50% of this time involved direct face-to-face encounter with the patient for counseling and/or coordination of their care.    Valentina Gu. Roxy Manns, MD 04/05/2018 10:19 AM

## 2018-04-19 ENCOUNTER — Telehealth: Payer: Self-pay | Admitting: Physician Assistant

## 2018-04-19 NOTE — Telephone Encounter (Signed)
Form completion for assisted living requires a face-to-face visit

## 2018-04-20 NOTE — Telephone Encounter (Signed)
Pt's wife advised. Appointment scheduled.

## 2018-04-22 ENCOUNTER — Telehealth (INDEPENDENT_AMBULATORY_CARE_PROVIDER_SITE_OTHER): Payer: Medicare Other | Admitting: Physician Assistant

## 2018-04-22 DIAGNOSIS — Z0289 Encounter for other administrative examinations: Secondary | ICD-10-CM | POA: Diagnosis not present

## 2018-04-22 MED ORDER — ATORVASTATIN CALCIUM 80 MG PO TABS: 80.0000 mg | ORAL_TABLET | Freq: Every evening | ORAL | Status: AC

## 2018-04-22 NOTE — Telephone Encounter (Signed)
Appointment has been canceled. [name] [name] has printed forms.

## 2018-04-22 NOTE — Telephone Encounter (Signed)
Due to safety measures for COVID-19 a face-to-face interview was not performed. Spent 10 minutes by phone completing Resident Health Assessment for Assisted Living Facilities with spouse [name] [name] providing the information for the health assessment.  Please contact Beverely Risen at [phone] (alt # [phone]) to fax completed forms  Please cancel patient's upcoming appointment for 04/27/2018

## 2018-04-27 ENCOUNTER — Ambulatory Visit: Payer: Medicare Other | Admitting: Physician Assistant

## 2018-06-15 ENCOUNTER — Telehealth (INDEPENDENT_AMBULATORY_CARE_PROVIDER_SITE_OTHER): Payer: Medicare Other | Admitting: Physician Assistant

## 2018-06-15 ENCOUNTER — Encounter: Payer: Self-pay | Admitting: Physician Assistant

## 2018-06-15 DIAGNOSIS — Z0289 Encounter for other administrative examinations: Secondary | ICD-10-CM | POA: Diagnosis not present
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
## 2018-08-11 ENCOUNTER — Telehealth: Payer: Self-pay | Admitting: *Deleted

## 2018-08-11 NOTE — Telephone Encounter (Signed)
Recall removed, Mr.Kiehn has moved to [place].

## 2018-11-03 ENCOUNTER — Other Ambulatory Visit: Payer: Self-pay | Admitting: Cardiology

## 2018-11-04 NOTE — Telephone Encounter (Signed)
70M 66.5KG SCR 1.65 12/16/17 LOVW/CRENSHAW 03/12/18

## 2019-03-01 IMAGING — DX DG CHEST 1V PORT
1 series · 1 of 1 positions shown · non-contrast
Comparison: 03/31/2017, CT chest 03/20/2017

CLINICAL DATA: Status post mitral valve repair

EXAM:
PORTABLE CHEST 1 VIEW

[chest ap]
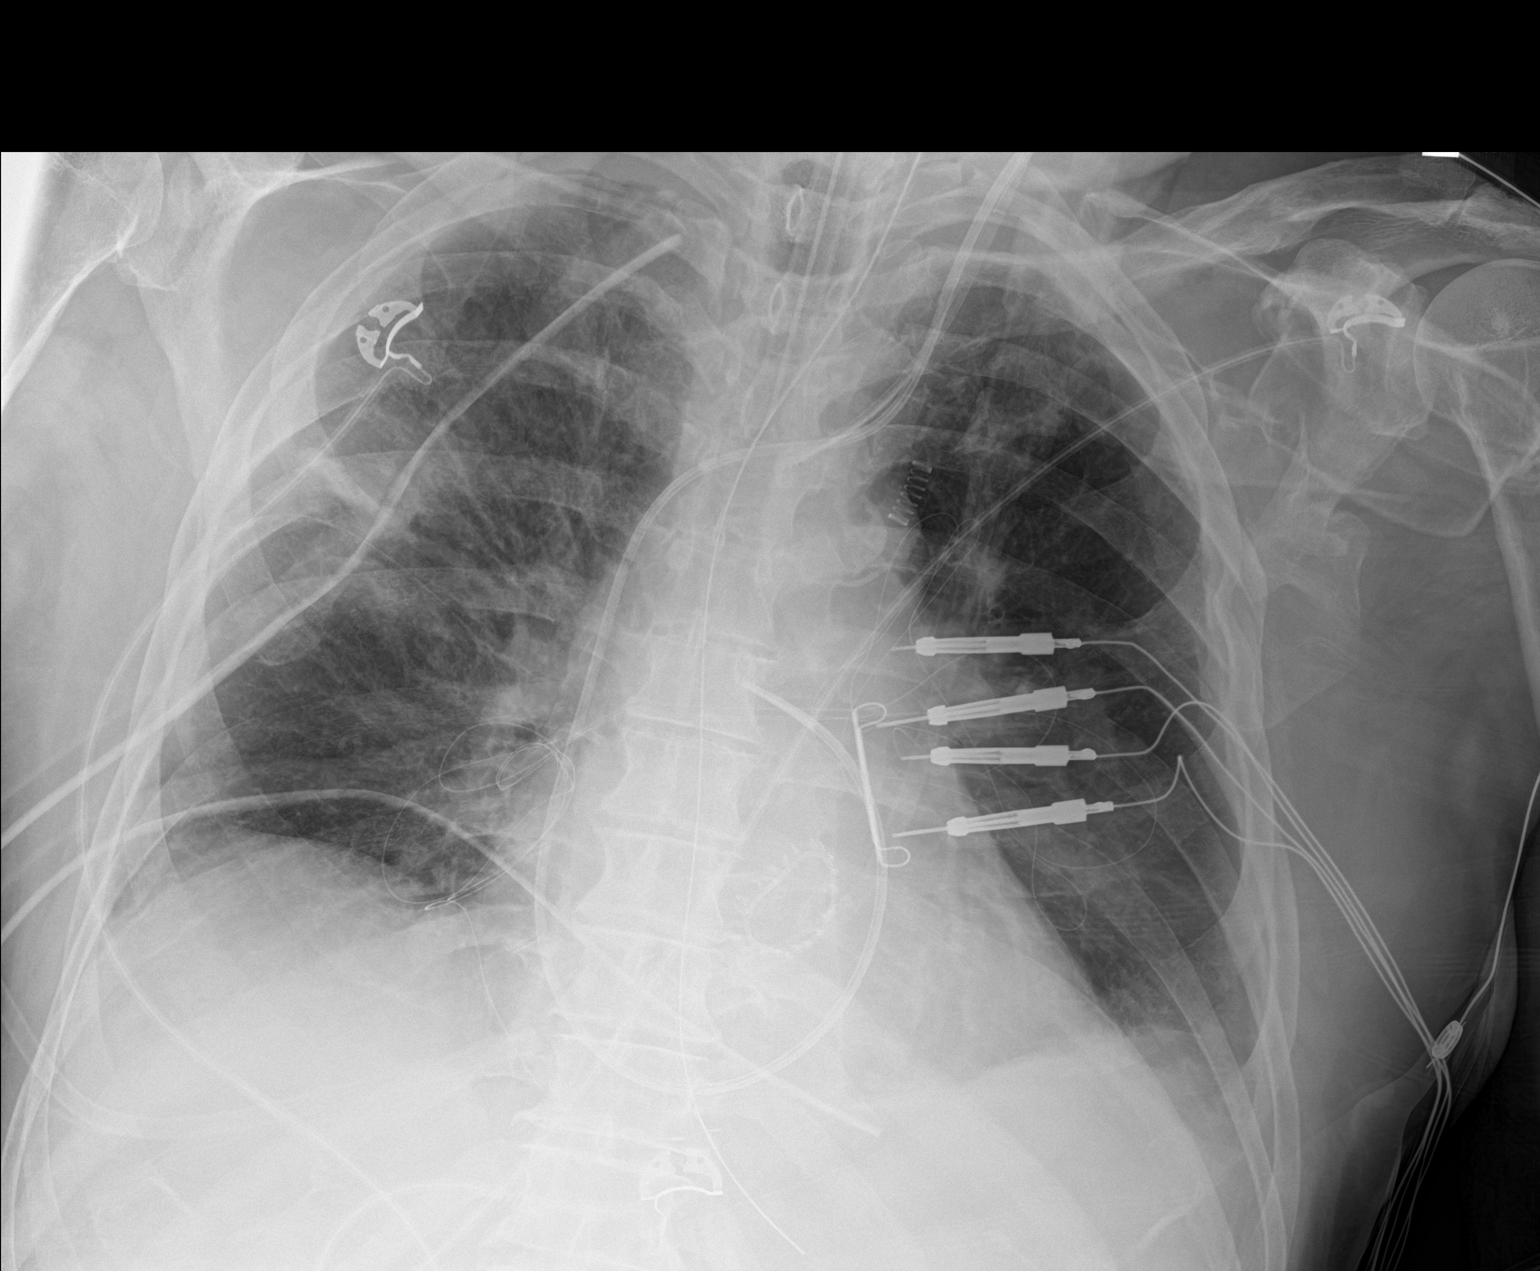

[1 of 1 positions shown; findings below may reference images not displayed]

FINDINGS: Endotracheal tube tip about 4 cm superior to the carina. Esophageal
tube tip below the diaphragm. Right-sided chest tubes with tips
projecting over the right apex and lower mediastinum. Valvular
prosthesis. Left-sided central venous catheter tip overlies the
brachiocephalic vein. Left-sided jugular Swan-Ganz catheter tip over
right central pulmonary artery. No definitive pneumothorax is seen.
Trace pleural effusion and streaky atelectasis at the bases and
right upper lobe. Cardiomediastinal silhouette within normal limits.
Aortic atherosclerosis. Old left-sided rib fracture. Chronic
appearing left scapular deformity.
IMPRESSION: 1. Placement of support lines and tubes as above with postoperative
changes. Endotracheal tube tip about 4 cm superior to carina
2. Tiny pleural effusions with left basilar and right upper lobe
subsegmental atelectasis.

## 2019-03-02 IMAGING — DX DG CHEST 1V PORT
1 series · 1 of 1 positions shown · non-contrast
Comparison: 04/02/2017, 12/02/2011.  CT 03/20/2017.

CLINICAL DATA: Atelectasis.

EXAM:
PORTABLE CHEST 1 VIEW

[chest ap]
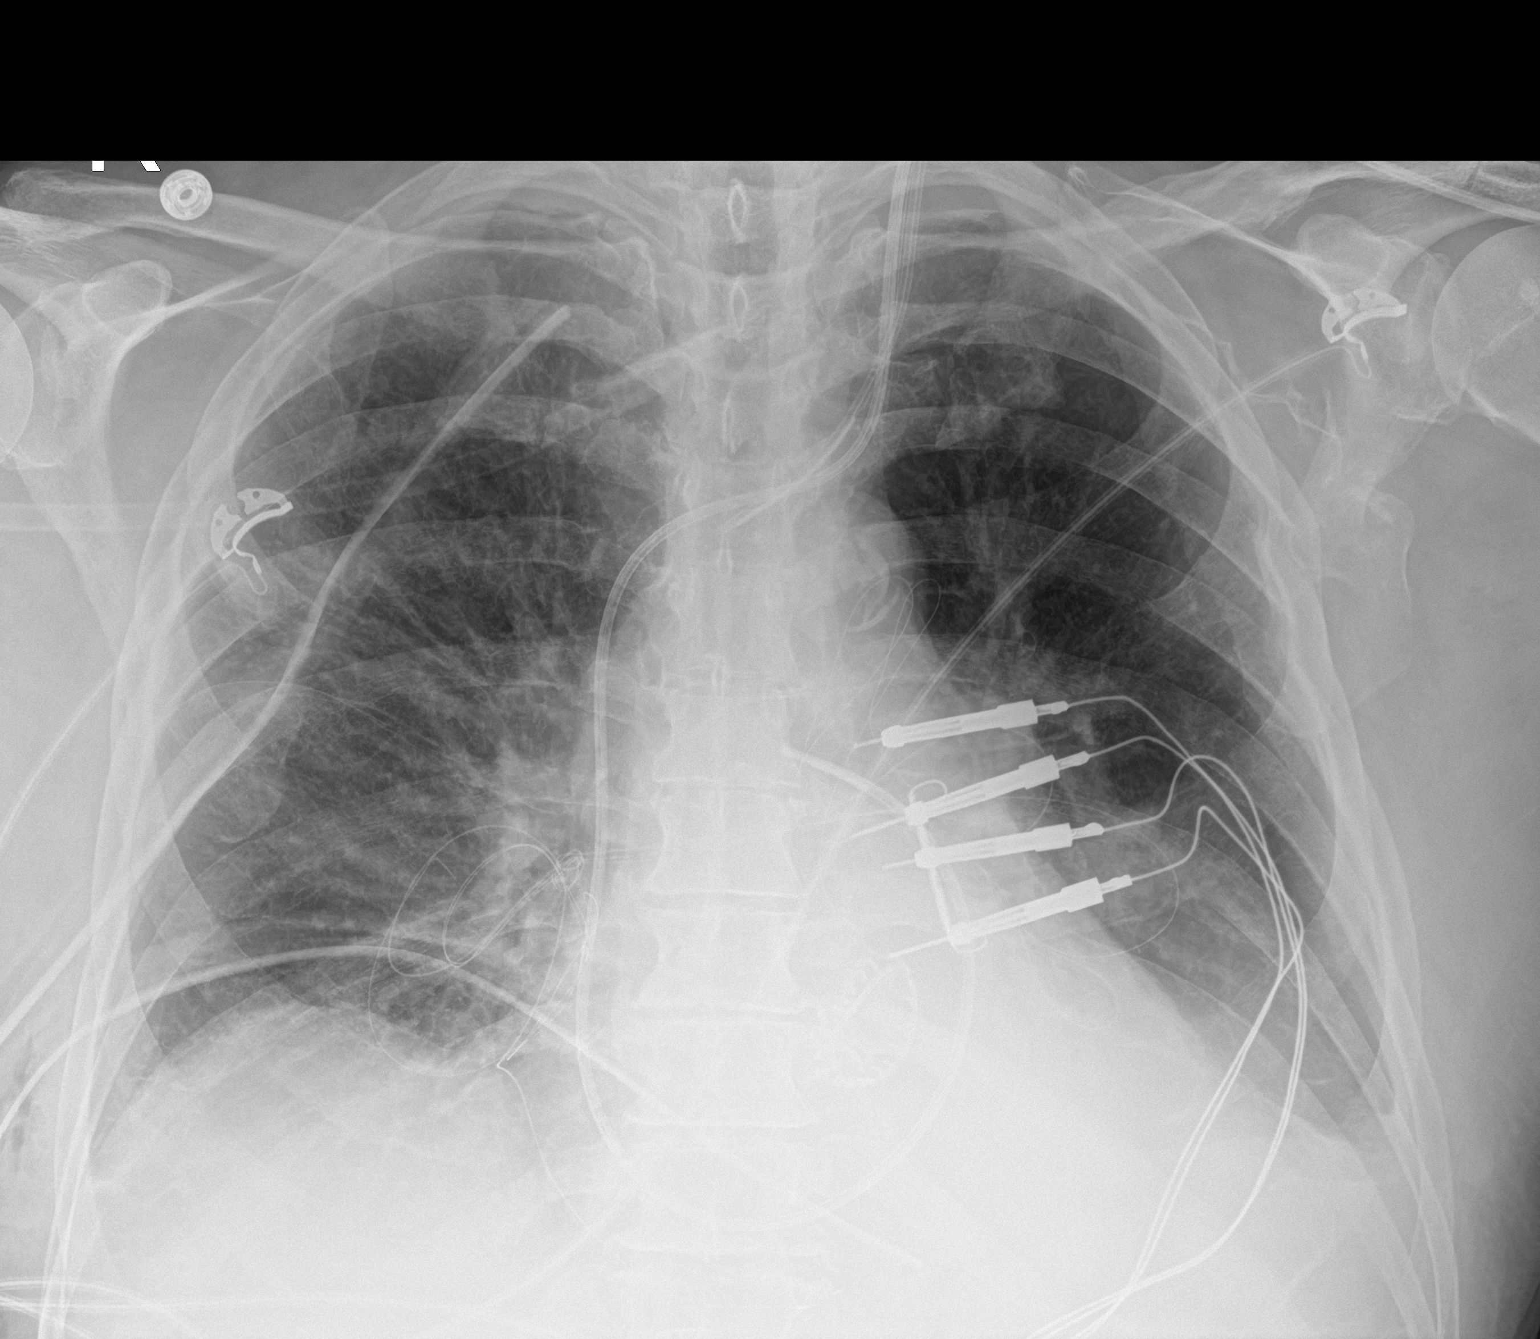

[1 of 1 positions shown; findings below may reference images not displayed]

FINDINGS: Interim extubation removal of NG tube. Swan-Ganz catheter and left
IJ line stable position. Right chest tubes in stable position.
Stable cardiomegaly. Prior cardiac valve replacement. Epicardial
pacing wires noted. Cardiomegaly with mild bilateral interstitial
prominence and small pleural effusions suggesting mild CHF. Low lung
volumes with basilar atelectasis. Stable deformity of the left
scapula. Old left rib fractures. Mild right chest wall subcutaneous
emphysema.
IMPRESSION: 1. Interim extubation and removal of NG tube. Right chest tubes,
Swan-Ganz catheter, left IJ line stable position. No pneumothorax.

2. Prior cardiac valve replacement. Cardiomegaly with mild bilateral
interstitial prominence and small bilateral pleural effusions
suggesting mild CHF. Low lung volumes with basilar atelectasis.

## 2019-03-03 IMAGING — DX DG CHEST 1V PORT
1 series · 1 of 1 positions shown · non-contrast
Comparison: 04/03/2017 and earlier.

CLINICAL DATA: 76-year-old male status post mitral valve repair
postoperative day 2.

EXAM:
PORTABLE CHEST 1 VIEW

[chest ap]
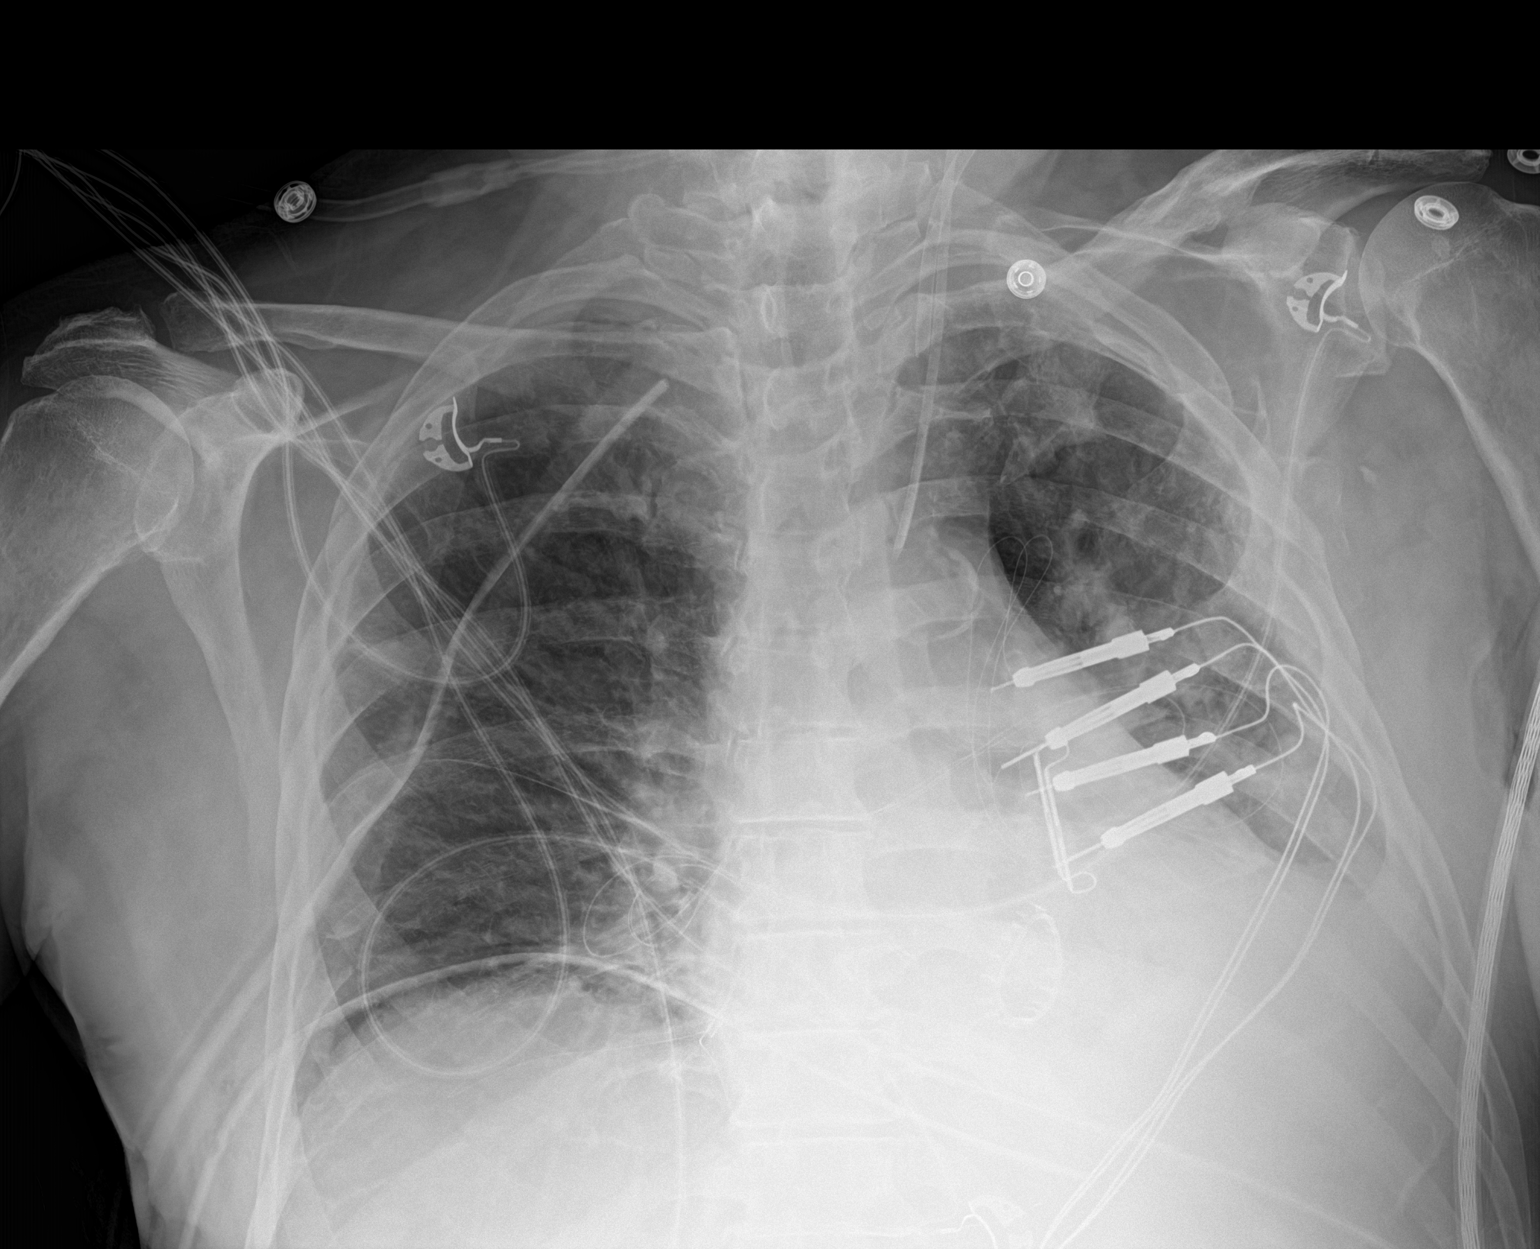

[1 of 1 positions shown; findings below may reference images not displayed]

FINDINGS: Portable AP semi upright view at 0261 hours. Left IJ approach
Swan-Ganz catheter has been removed, the left IJ central line and
introducer sheath remain. Stable right chest tube. Epicardial pacer
wires remain.

Mildly lower lung volumes. Left pleural effusion with left lung base
and retrocardiac opacity not significantly changed. No pneumothorax
or pulmonary edema. No confluent right lung opacity.

Stable cardiac size and mediastinal contours. Cardiac valve
replacement. Paucity of bowel gas in the upper abdomen.
IMPRESSION: 1. Left IJ approach Swan-Ganz catheter removed. Introducer sheath,
left IJ central line, and right chest tube remain.
2. No pneumothorax or pulmonary edema.
3. Left pleural effusion and atelectasis.

## 2019-03-04 IMAGING — DX DG CHEST 1V PORT
1 series · 1 of 1 positions shown · non-contrast
Comparison: 04/04/2017 and earlier.

CLINICAL DATA: 76-year-old male postoperative day 3 status post
mitral valve repair.

EXAM:
PORTABLE CHEST 1 VIEW

[chest ap]
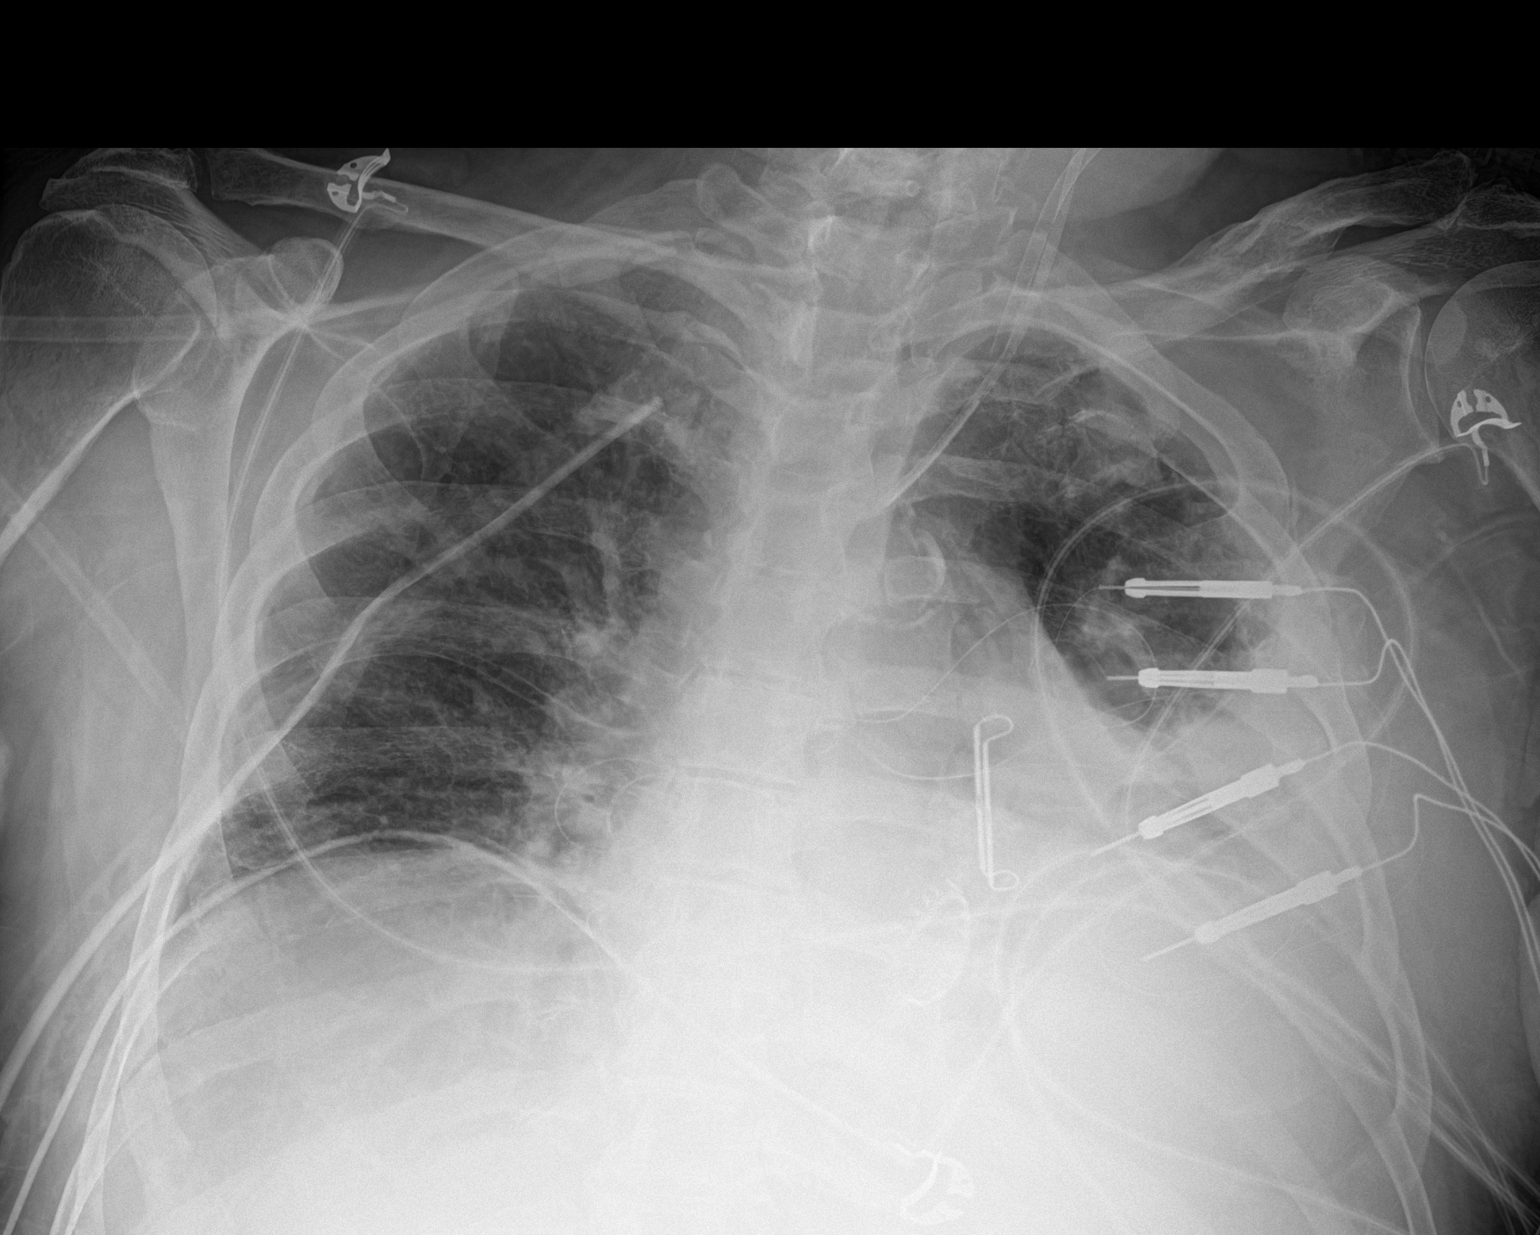

[1 of 1 positions shown; findings below may reference images not displayed]

FINDINGS: Portable AP semi upright view at 3333 hours. Stable left IJ central
line and introducer sheath. Two right chest tubes remain stable.
Continued low lung volumes. Patchy in veiling opacity in the left
lower lung has progressed since yesterday. No pneumothorax
identified. No pulmonary edema. No pleural effusion on the right
side. Stable mediastinal contours.
IMPRESSION: 1.  Stable lines and tubes.
2. Worsening ventilation in the left lung, probably increasing left
pleural effusion and/or atelectasis.
3. Right chest tubes remain in place. No pneumothorax or right
pleural effusion.

## 2019-03-05 IMAGING — DX DG CHEST 1V PORT
1 series · 1 of 1 positions shown · non-contrast
Comparison: 04/05/2017 chest x-ray.

CLINICAL DATA: 76-year-old male post mitral valve replacement.
Subsequent encounter.

EXAM:
PORTABLE CHEST 1 VIEW

[chest]
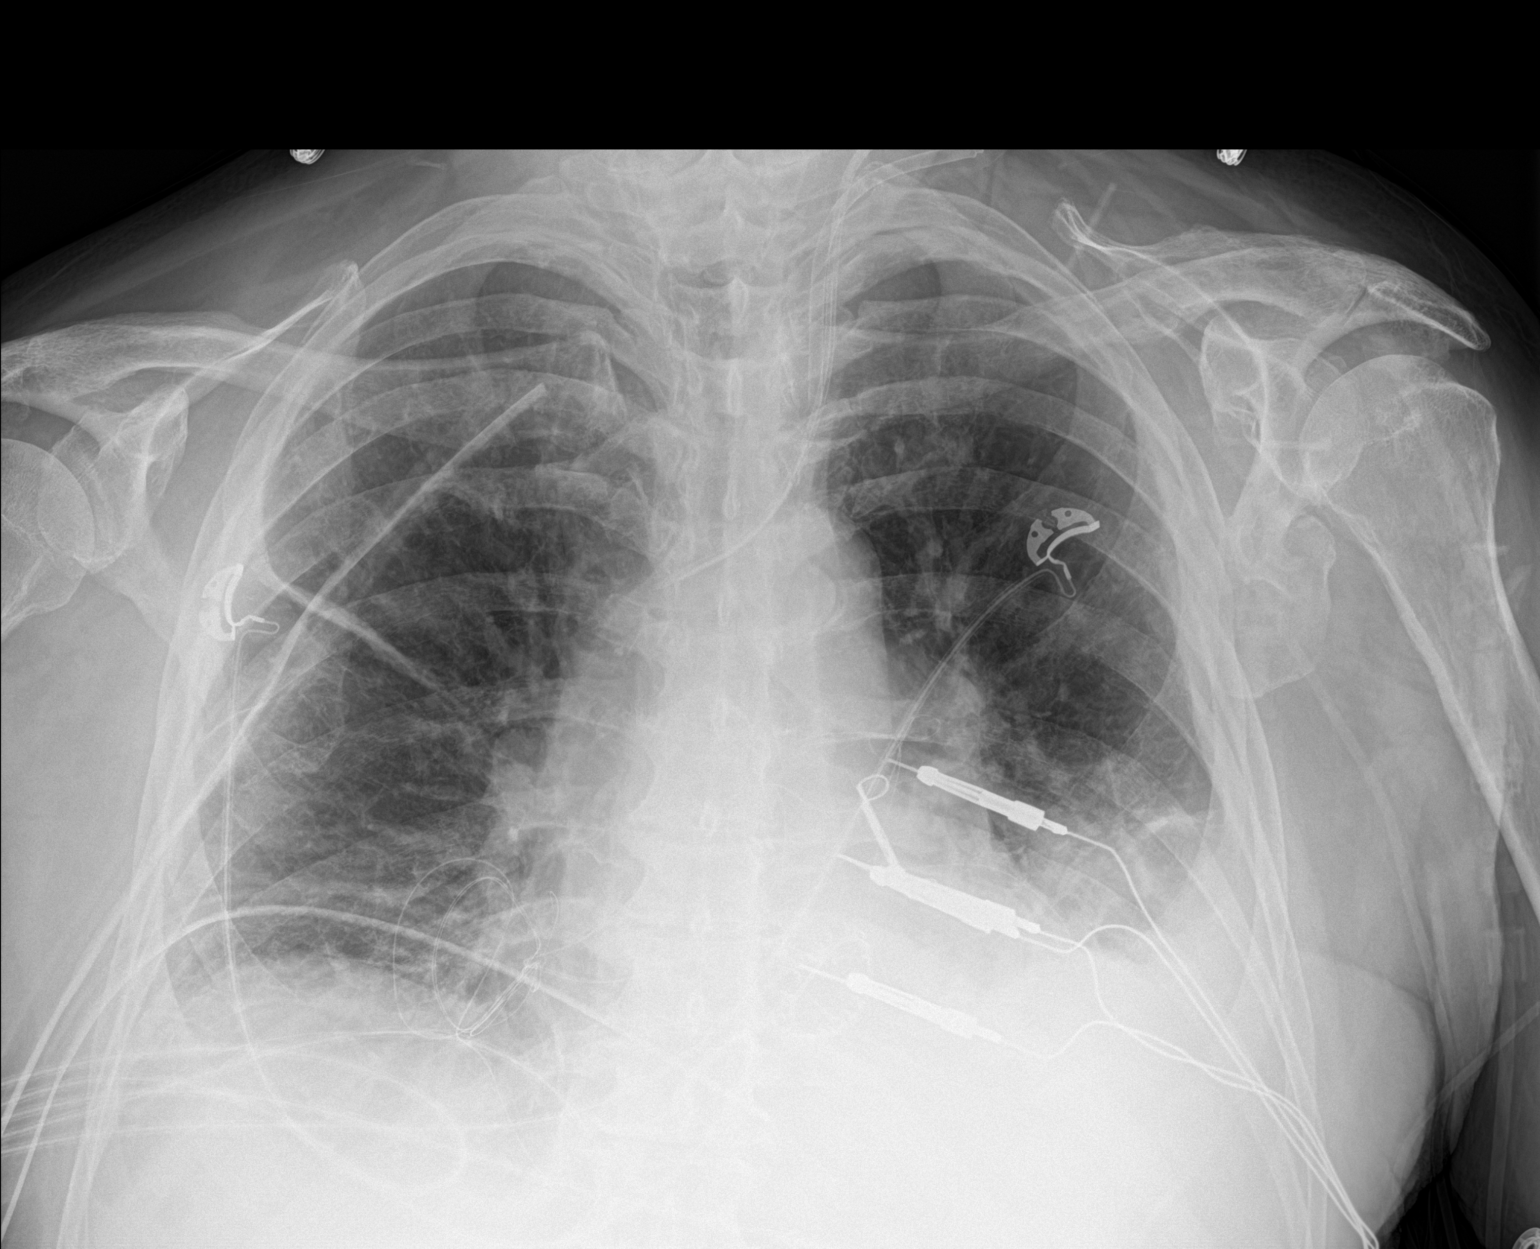

[1 of 1 positions shown; findings below may reference images not displayed]

FINDINGS: Right-sided chest tubes remain in place. Two left central lines in
place with tip in the region of the formation of the superior vena
cava and left jugular vein/brachiocephalic vein expected junction.
Appearance unchanged.

No pneumothorax identified.

Post mitral valve replacement. Heart size difficult to assess
although appearing enlarged. Epicardial leads in place.

Calcified aorta.

Left base consolidation may represent pleural fluid with
atelectasis. Infiltrate secondary less likely consideration.

Pulmonary vascular congestion with small amount of fluid in the
minor fissure.
IMPRESSION: Persistent left base consolidation slightly improved suggestive of
pleural fluid/atelectasis with infiltrate a less likely
consideration.

Pulmonary vascular congestion with small amount of fluid minor
fissure.

Post mitral valve replacement with cardiomegaly suspected.

Aortic Atherosclerosis ([id number]).

## 2020-12-04 DEATH — deceased
